# Patient Record
Sex: Female | Born: 1950
Health system: Southern US, Community
[De-identification: ages and names within clinical notes are randomized; demographics above are authoritative.]

## PROBLEM LIST (undated history)

## (undated) DIAGNOSIS — M810 Age-related osteoporosis without current pathological fracture: Secondary | ICD-10-CM

## (undated) DIAGNOSIS — F1910 Other psychoactive substance abuse, uncomplicated: Secondary | ICD-10-CM

## (undated) DIAGNOSIS — M2140 Flat foot [pes planus] (acquired), unspecified foot: Secondary | ICD-10-CM

## (undated) DIAGNOSIS — F419 Anxiety disorder, unspecified: Secondary | ICD-10-CM

## (undated) DIAGNOSIS — Z87898 Personal history of other specified conditions: Secondary | ICD-10-CM

## (undated) DIAGNOSIS — F329 Major depressive disorder, single episode, unspecified: Secondary | ICD-10-CM

## (undated) DIAGNOSIS — F32A Depression, unspecified: Secondary | ICD-10-CM

## (undated) DIAGNOSIS — M2012 Hallux valgus (acquired), left foot: Secondary | ICD-10-CM

## (undated) HISTORY — DX: Other psychoactive substance abuse, uncomplicated: F19.10

## (undated) HISTORY — DX: Hallux valgus (acquired), left foot: M20.12

## (undated) HISTORY — DX: Age-related osteoporosis without current pathological fracture: M81.0

## (undated) HISTORY — DX: Flat foot (pes planus) (acquired), unspecified foot: M21.40

## (undated) HISTORY — DX: Personal history of other specified conditions: Z87.898

## (undated) HISTORY — DX: Anxiety disorder, unspecified: F41.9

## (undated) HISTORY — DX: Depression, unspecified: F32.A

## (undated) HISTORY — PX: OTHER SURGICAL HISTORY: SHX169

## (undated) HISTORY — PX: AUGMENTATION MAMMAPLASTY: SUR837

## (undated) HISTORY — PX: COLONOSCOPY: SHX174

## (undated) HISTORY — DX: Major depressive disorder, single episode, unspecified: F32.9

## (undated) HISTORY — PX: PLACEMENT OF BREAST IMPLANTS: SHX6334

---

## 2013-08-11 LAB — HM MAMMOGRAPHY: HM MAMMO: NORMAL

## 2013-09-17 LAB — HM PAP SMEAR: HM PAP: NEGATIVE

## 2013-09-24 LAB — HM COLONOSCOPY: HM COLON: NEGATIVE

## 2014-09-13 DIAGNOSIS — F411 Generalized anxiety disorder: Secondary | ICD-10-CM | POA: Diagnosis not present

## 2014-09-13 DIAGNOSIS — F341 Dysthymic disorder: Secondary | ICD-10-CM | POA: Diagnosis not present

## 2014-09-30 DIAGNOSIS — R748 Abnormal levels of other serum enzymes: Secondary | ICD-10-CM | POA: Diagnosis not present

## 2014-09-30 DIAGNOSIS — Z79899 Other long term (current) drug therapy: Secondary | ICD-10-CM | POA: Diagnosis not present

## 2014-10-11 DIAGNOSIS — Z23 Encounter for immunization: Secondary | ICD-10-CM | POA: Diagnosis not present

## 2014-10-11 DIAGNOSIS — Z0000 Encounter for general adult medical examination without abnormal findings: Secondary | ICD-10-CM | POA: Diagnosis not present

## 2014-10-14 DIAGNOSIS — Z1231 Encounter for screening mammogram for malignant neoplasm of breast: Secondary | ICD-10-CM | POA: Diagnosis not present

## 2014-10-17 LAB — HM MAMMOGRAPHY

## 2014-11-30 DIAGNOSIS — Z1389 Encounter for screening for other disorder: Secondary | ICD-10-CM | POA: Diagnosis not present

## 2014-11-30 DIAGNOSIS — F329 Major depressive disorder, single episode, unspecified: Secondary | ICD-10-CM | POA: Diagnosis not present

## 2014-11-30 DIAGNOSIS — Z6826 Body mass index (BMI) 26.0-26.9, adult: Secondary | ICD-10-CM | POA: Diagnosis not present

## 2015-02-22 DIAGNOSIS — F411 Generalized anxiety disorder: Secondary | ICD-10-CM | POA: Diagnosis not present

## 2015-02-22 DIAGNOSIS — F321 Major depressive disorder, single episode, moderate: Secondary | ICD-10-CM | POA: Diagnosis not present

## 2015-03-13 DIAGNOSIS — Z23 Encounter for immunization: Secondary | ICD-10-CM | POA: Diagnosis not present

## 2015-03-30 DIAGNOSIS — F321 Major depressive disorder, single episode, moderate: Secondary | ICD-10-CM | POA: Diagnosis not present

## 2015-03-30 DIAGNOSIS — F411 Generalized anxiety disorder: Secondary | ICD-10-CM | POA: Diagnosis not present

## 2015-04-04 ENCOUNTER — Encounter: Payer: Self-pay | Admitting: Internal Medicine

## 2015-04-05 ENCOUNTER — Ambulatory Visit (INDEPENDENT_AMBULATORY_CARE_PROVIDER_SITE_OTHER): Payer: Medicare Other | Admitting: Family Medicine

## 2015-04-05 ENCOUNTER — Encounter: Payer: Self-pay | Admitting: Family Medicine

## 2015-04-05 VITALS — BP 122/70 | HR 72 | Ht 61.0 in | Wt 145.0 lb

## 2015-04-05 DIAGNOSIS — R42 Dizziness and giddiness: Secondary | ICD-10-CM | POA: Diagnosis not present

## 2015-04-05 DIAGNOSIS — F329 Major depressive disorder, single episode, unspecified: Secondary | ICD-10-CM

## 2015-04-05 DIAGNOSIS — F32A Depression, unspecified: Secondary | ICD-10-CM | POA: Insufficient documentation

## 2015-04-05 DIAGNOSIS — F411 Generalized anxiety disorder: Secondary | ICD-10-CM | POA: Diagnosis not present

## 2015-04-05 DIAGNOSIS — M79621 Pain in right upper arm: Secondary | ICD-10-CM

## 2015-04-05 DIAGNOSIS — Z7189 Other specified counseling: Secondary | ICD-10-CM | POA: Diagnosis not present

## 2015-04-05 DIAGNOSIS — Z7689 Persons encountering health services in other specified circumstances: Secondary | ICD-10-CM

## 2015-04-05 NOTE — Patient Instructions (Addendum)
For your arm pain try taking one Aleve 2 times daily for the next 2 weeks and using heat 20 minutes at a time. Keep a journal of your dizzy/lightheaded episodes and let me know if you are seeing a pattern or other symptoms. Your neurological exam today is normal. Stay well hydrated.    You can call to schedule your appointment with the psychiatrist. A few offices are listed below for you to call.   [facility]  [place], [place], [place] [address]  Phone: [phone]  [facility] [address] [place], [place] [address]  Phone: [place], [place], [place], [place], [place] [address] Phone: [phone]

## 2015-04-05 NOTE — Progress Notes (Signed)
Subjective:    Patient ID: Stacy Decker, female    DOB: [birth year], 64 y.o.   MRN: [id number]  HPI Chief Complaint  Patient presents with  . right arm pain and dizzy    right arm pain and dizzy- right arm pain for the last 2 months and then dizziness for the last 2 years.   She is new to the practice and here to establish primary care.   She has 2 acute complaints: 1)Complains of dizziness, lightheaded intermittently over past 6 months or longer. Happens a couple of times per month and is brief, lasting a few seconds. Cannot associate spells with anything such as positional changes but states she has not really been paying attention, they just come on out of the blue.  Denies syncope, headaches, chest pain, palpitations, DOE, blood in stool or urine, GI or GU complaints. States she does not do a good job drinking fluids.   2) Also complains of right upper anterior arm pain for past 3-4 months intermittent, non radiating and worse occasionally with playing tennis or when sleeping on it. Has taken Ibuprofen 400 mg-600 mg but not on regular basis.    History of fall with dog, and on ice. No broken bones. Sees a psychiatrist for depression/anxiety medications. States she is not thrilled with person she is seeing here.  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   Had medicare wellness exam this year. States normal exam. Colonoscopy- utd Mammogram-utd Pap smear-utd Periods stopped at least 10 years ago. Takes vitamin D and calcium.   Surgeries: carpel tunnel right, breast augmentation  Social: lives with husband, plays tennis, quit smoking in 1998, smoked 25 years and smoked 1.5 packs per year. Smoked a lot of marijuana in past History of alcohol addiction, met her husband in recovery.   Reviewed allergies, medications, past medical, surgical, social history.   Review of  Systems Pertinent positives and negatives in the history of present illness.    Objective:   Physical Exam  Constitutional: She is oriented to person, place, and time. She appears well-developed and well-nourished. No distress.  Cardiovascular: Normal rate, regular rhythm, intact distal pulses and normal pulses.   Pulmonary/Chest: Effort normal and breath sounds normal. She has no wheezes. She has no rhonchi.  Musculoskeletal:       Right shoulder: Normal.       Right elbow: Normal.      Arms: Neurological: She is alert and oriented to person, place, and time. She has normal strength and normal reflexes. No cranial nerve deficit or sensory deficit. She displays a negative Romberg sign. Gait normal.  No ataxia, normal finger to nose test, normal proprioception  Psychiatric: She has a normal mood and affect. Her speech is normal and behavior is normal. Thought content normal.  BP 122/70 mmHg  Pulse 72  Ht _0  (1.549 m)  Wt 145 lb (65.772 kg)  BMI 27.41 kg/m2  Orthostatic vitals normal, she is not postural    Assessment & Plan:  Pain of right upper arm  Encounter to establish care  Dizziness of unknown cause  Depression  Generalized anxiety disorder  Discussed that her neurological exam is normal and that she is not postural. Also, the fact that her fleeting episodes of lightheadedness are occuring so infrequently that it is not easy to pinpoint the exact etiology but it does not appear to be anything serious. Encouraged her to  start drinking more water and stay hydrated, avoid skipping meals and keep a journal of when the episodes are occurring in any associated symptoms or factors.  Suspect that her right upper arm pain is related to biceps tendinitis and recommend one Aleve twice daily for the next 2 weeks, take this with food and try heat 20 minutes at a time and resting her arm. Recommend she stop playing tennis for a couple weeks and see if her arm pain improves. Discussed  that the next step of treatment would be to discuss this with Dr. Redmond School and consider injection vs orthopedist referral.   She had physical exam with fasting labs this year. She will follow up as needed with me and return next year for physical.  Provided names of psychiatrists for her. She states she may want to switch to a different one. She is seen a psychiatrist for depression/anxiety medication management and states she is doing well on her current medication regimen.

## 2015-04-06 ENCOUNTER — Encounter: Payer: Self-pay | Admitting: Family Medicine

## 2015-04-13 ENCOUNTER — Encounter: Payer: Self-pay | Admitting: Family Medicine

## 2015-07-24 ENCOUNTER — Ambulatory Visit (INDEPENDENT_AMBULATORY_CARE_PROVIDER_SITE_OTHER): Payer: Medicare Other | Admitting: Family Medicine

## 2015-07-24 ENCOUNTER — Encounter: Payer: Self-pay | Admitting: Family Medicine

## 2015-07-24 ENCOUNTER — Other Ambulatory Visit: Payer: Self-pay | Admitting: Family Medicine

## 2015-07-24 VITALS — BP 124/80 | HR 68 | Temp 98.4°F | Wt 141.8 lb

## 2015-07-24 DIAGNOSIS — R110 Nausea: Secondary | ICD-10-CM | POA: Diagnosis not present

## 2015-07-24 DIAGNOSIS — R42 Dizziness and giddiness: Secondary | ICD-10-CM | POA: Diagnosis not present

## 2015-07-24 DIAGNOSIS — H8110 Benign paroxysmal vertigo, unspecified ear: Secondary | ICD-10-CM

## 2015-07-24 DIAGNOSIS — R7309 Other abnormal glucose: Secondary | ICD-10-CM | POA: Diagnosis not present

## 2015-07-24 LAB — CBC WITH DIFFERENTIAL/PLATELET
BASOS PCT: 1 % (ref 0–1)
Basophils Absolute: 0.1 10*3/uL (ref 0.0–0.1)
EOS PCT: 2 % (ref 0–5)
Eosinophils Absolute: 0.2 10*3/uL (ref 0.0–0.7)
HEMATOCRIT: 43.8 % (ref 36.0–46.0)
Hemoglobin: 14.5 g/dL (ref 12.0–15.0)
LYMPHS PCT: 13 % (ref 12–46)
Lymphs Abs: 1 10*3/uL (ref 0.7–4.0)
MCH: 29.4 pg (ref 26.0–34.0)
MCHC: 33.1 g/dL (ref 30.0–36.0)
MCV: 88.7 fL (ref 78.0–100.0)
MONO ABS: 0.6 10*3/uL (ref 0.1–1.0)
MONOS PCT: 7 % (ref 3–12)
MPV: 10.3 fL (ref 8.6–12.4)
Neutro Abs: 6.2 10*3/uL (ref 1.7–7.7)
Neutrophils Relative %: 77 % (ref 43–77)
PLATELETS: 260 10*3/uL (ref 150–400)
RBC: 4.94 MIL/uL (ref 3.87–5.11)
RDW: 14.4 % (ref 11.5–15.5)
WBC: 8 10*3/uL (ref 4.0–10.5)

## 2015-07-24 MED ORDER — MECLIZINE HCL 25 MG PO TABS: 25.0000 mg | ORAL_TABLET | Freq: Two times a day (BID) | ORAL | Status: DC | PRN

## 2015-07-24 MED ORDER — ONDANSETRON HCL 4 MG PO TABS: 4.0000 mg | ORAL_TABLET | Freq: Three times a day (TID) | ORAL | Status: DC | PRN

## 2015-07-24 NOTE — Patient Instructions (Signed)
Stay well hydrated. Take 12.5 mg of Meclizine twice daily and see if this helps with the dizziness. Change positions slowly. Take the Zofran as needed for nausea. Ginger ale is also good for this. Call if not improving in the next 2-3 days or if you get worse.  Benign Positional Vertigo Vertigo is the feeling that you or your surroundings are moving when they are not. Benign positional vertigo is the most common form of vertigo. The cause of this condition is not serious (is benign). This condition is triggered by certain movements and positions (is positional). This condition can be dangerous if it occurs while you are doing something that could endanger you or others, such as driving.  CAUSES In many cases, the cause of this condition is not known. It may be caused by a disturbance in an area of the inner ear that helps your brain to sense movement and balance. This disturbance can be caused by a viral infection (labyrinthitis), head injury, or repetitive motion. RISK FACTORS This condition is more likely to develop in:  Women.  People who are 46 years of age or older. SYMPTOMS Symptoms of this condition usually happen when you move your head or your eyes in different directions. Symptoms may start suddenly, and they usually last for less than a minute. Symptoms may include:  Loss of balance and falling.  Feeling like you are spinning or moving.  Feeling like your surroundings are spinning or moving.  Nausea and vomiting.  Blurred vision.  Dizziness.  Involuntary eye movement (nystagmus). Symptoms can be mild and cause only slight annoyance, or they can be severe and interfere with daily life. Episodes of benign positional vertigo may return (recur) over time, and they may be triggered by certain movements. Symptoms may improve over time. DIAGNOSIS This condition is usually diagnosed by medical history and a physical exam of the head, neck, and ears. You may be referred to a health  care provider who specializes in ear, nose, and throat (ENT) problems (otolaryngologist) or a provider who specializes in disorders of the nervous system (neurologist). You may have additional testing, including:  MRI.  A CT scan.  Eye movement tests. Your health care provider may ask you to change positions quickly while he or she watches you for symptoms of benign positional vertigo, such as nystagmus. Eye movement may be tested with an electronystagmogram (ENG), caloric stimulation, the Dix-Hallpike test, or the roll test.  An electroencephalogram (EEG). This records electrical activity in your brain.  Hearing tests. TREATMENT Usually, your health care provider will treat this by moving your head in specific positions to adjust your inner ear back to normal. Surgery may be needed in severe cases, but this is rare. In some cases, benign positional vertigo may resolve on its own in 2-4 weeks. HOME CARE INSTRUCTIONS Safety  Move slowly.Avoid sudden body or head movements.  Avoid driving.  Avoid operating heavy machinery.  Avoid doing any tasks that would be dangerous to you or others if a vertigo episode would occur.  If you have trouble walking or keeping your balance, try using a cane for stability. If you feel dizzy or unstable, sit down right away.  Return to your normal activities as told by your health care provider. Ask your health care provider what activities are safe for you. General Instructions  Take over-the-counter and prescription medicines only as told by your health care provider.  Avoid certain positions or movements as told by your health care provider.  Drink enough fluid to keep your urine clear or pale yellow.  Keep all follow-up visits as told by your health care provider. This is important. SEEK MEDICAL CARE IF:  You have a fever.  Your condition gets worse or you develop new symptoms.  Your family or friends notice any behavioral changes.  Your  nausea or vomiting gets worse.  You have numbness or a "pins and needles" sensation. SEEK IMMEDIATE MEDICAL CARE IF:  You have difficulty speaking or moving.  You are always dizzy.  You faint.  You develop severe headaches.  You have weakness in your legs or arms.  You have changes in your hearing or vision.  You develop a stiff neck.  You develop sensitivity to light.   This information is not intended to replace advice given to you by your health care provider. Make sure you discuss any questions you have with your health care provider.   Document Released: 02/18/2006 Document Revised: 02/01/2015 Document Reviewed: 09/05/2014 Elsevier Interactive Patient Education [employer].

## 2015-07-24 NOTE — Progress Notes (Signed)
   Subjective:    Patient ID: Stacy Decker, female    DOB: [birth year], 65 y.o.   MRN: [id number]  HPI Chief Complaint  Patient presents with  . veritago    vertigo started yesterday. nausea. gets vertigo every 6 months.    She is here with complaints of dizziness since waking up this morning that she describes as a spinning sensation. States she noticed it when turning over in bed and with changing positions. States the spinning sensation goes away within a few seconds after she changes position. Also reports intermittent nausea, no vomiting. She reports history similar dizzy episodes but has never been diagnosed with vertigo.  Denies tinnitus, headache, syncope, visual changes, chest pain, palpitations, DOE, LE edema, cough, vomiting, diarrhea, abdominal pain, numbness, tingling, weakness to her extremities.    Review of Systems Pertinent positives and negatives in the history of present illness.     Objective:   Physical Exam  Constitutional: She is oriented to person, place, and time. She appears well-developed and well-nourished. No distress.  HENT:  Right Ear: Hearing, tympanic membrane and ear canal normal.  Left Ear: Hearing, tympanic membrane and ear canal normal.  Nose: Nose normal.  Mouth/Throat: Uvula is midline, oropharynx is clear and moist and mucous membranes are normal.  Eyes: Conjunctivae, EOM and lids are normal. Pupils are equal, round, and reactive to light.  Neck: Normal range of motion and full passive range of motion without pain. Neck supple.  Cardiovascular: Normal rate, regular rhythm, normal heart sounds and normal pulses.   Pulmonary/Chest: Effort normal and breath sounds normal.  Lymphadenopathy:    She has no cervical adenopathy.  Neurological: She is alert and oriented to person, place, and time. She has normal strength and normal reflexes. She displays no tremor. No cranial nerve deficit or sensory deficit. She displays a negative Romberg sign. Coordination  and gait normal.  Finger to nose normal, no ataxia.   Heel to shin normal Equal grip Normal rapid movement test  Skin: Skin is warm and dry. No rash noted. No cyanosis. No pallor.  Psychiatric: She has a normal mood and affect. Her speech is normal and behavior is normal. Judgment and thought content normal. Cognition and memory are normal.   BP 130/80 mmHg  Pulse 64  Temp(Src) 98.4 F (36.9 C) (Oral)  Wt 141 lb 12.8 oz (64.32 kg)   Orthostatic vitals: not postural    Assessment & Plan:  Dizzinesses - Plan: CBC with Differential/Platelet, Comprehensive metabolic panel, meclizine (ANTIVERT) 25 MG tablet  BPPV (benign paroxysmal positional vertigo), unspecified laterality  Nausea without vomiting - Plan: ondansetron (ZOFRAN) 4 MG tablet  Discussed with patient that her neurological exam is normal and she is not postural. Discussed differentials for dizziness for dizziness and suspect that her symptoms are related to vertigo, positional. Recommend that she stay well hydrated, take meclizine 12.5 mg twice daily and see if this helps with symptoms. Prescription for Zofran also sent to pharmacy to use as needed for nausea. She will let me know if medication is not helping with her symptoms. Discussed red flags of stroke-like symptoms. Discussed that we will consider vestibular rehab in future if not improving.

## 2015-07-25 LAB — HEMOGLOBIN A1C
HEMOGLOBIN A1C: 5.7 % — AB (ref <5.7)
MEAN PLASMA GLUCOSE: 117 mg/dL — AB (ref <117)

## 2015-07-25 LAB — COMPREHENSIVE METABOLIC PANEL
ALK PHOS: 86 U/L (ref 33–130)
ALT: 10 U/L (ref 6–29)
AST: 17 U/L (ref 10–35)
Albumin: 3.9 g/dL (ref 3.6–5.1)
BUN: 14 mg/dL (ref 7–25)
CALCIUM: 9.2 mg/dL (ref 8.6–10.4)
CHLORIDE: 103 mmol/L (ref 98–110)
CO2: 27 mmol/L (ref 20–31)
Creat: 0.63 mg/dL (ref 0.50–0.99)
GLUCOSE: 119 mg/dL — AB (ref 65–99)
POTASSIUM: 4.7 mmol/L (ref 3.5–5.3)
Sodium: 138 mmol/L (ref 135–146)
Total Bilirubin: 0.4 mg/dL (ref 0.2–1.2)
Total Protein: 6 g/dL — ABNORMAL LOW (ref 6.1–8.1)

## 2015-08-23 DIAGNOSIS — F321 Major depressive disorder, single episode, moderate: Secondary | ICD-10-CM | POA: Diagnosis not present

## 2015-08-23 DIAGNOSIS — F411 Generalized anxiety disorder: Secondary | ICD-10-CM | POA: Diagnosis not present

## 2015-09-18 ENCOUNTER — Encounter: Payer: Medicare Other | Admitting: Podiatry

## 2015-10-25 NOTE — Progress Notes (Signed)
This encounter was created in error - please disregard.

## 2015-12-28 DIAGNOSIS — F411 Generalized anxiety disorder: Secondary | ICD-10-CM | POA: Diagnosis not present

## 2015-12-28 DIAGNOSIS — F321 Major depressive disorder, single episode, moderate: Secondary | ICD-10-CM | POA: Diagnosis not present

## 2016-01-30 ENCOUNTER — Encounter: Payer: Self-pay | Admitting: Family Medicine

## 2016-01-30 ENCOUNTER — Ambulatory Visit (INDEPENDENT_AMBULATORY_CARE_PROVIDER_SITE_OTHER): Payer: Medicare Other | Admitting: Family Medicine

## 2016-01-30 VITALS — BP 120/78 | HR 80 | Wt 142.2 lb

## 2016-01-30 DIAGNOSIS — Z23 Encounter for immunization: Secondary | ICD-10-CM | POA: Diagnosis not present

## 2016-01-30 DIAGNOSIS — Z7289 Other problems related to lifestyle: Secondary | ICD-10-CM

## 2016-01-30 DIAGNOSIS — R7309 Other abnormal glucose: Secondary | ICD-10-CM | POA: Insufficient documentation

## 2016-01-30 DIAGNOSIS — Z13220 Encounter for screening for lipoid disorders: Secondary | ICD-10-CM | POA: Diagnosis not present

## 2016-01-30 DIAGNOSIS — F32A Depression, unspecified: Secondary | ICD-10-CM

## 2016-01-30 DIAGNOSIS — F411 Generalized anxiety disorder: Secondary | ICD-10-CM

## 2016-01-30 DIAGNOSIS — Z609 Problem related to social environment, unspecified: Secondary | ICD-10-CM

## 2016-01-30 DIAGNOSIS — F329 Major depressive disorder, single episode, unspecified: Secondary | ICD-10-CM | POA: Diagnosis not present

## 2016-01-30 DIAGNOSIS — M79672 Pain in left foot: Secondary | ICD-10-CM | POA: Diagnosis not present

## 2016-01-30 DIAGNOSIS — Z136 Encounter for screening for cardiovascular disorders: Secondary | ICD-10-CM

## 2016-01-30 DIAGNOSIS — R5383 Other fatigue: Secondary | ICD-10-CM

## 2016-01-30 DIAGNOSIS — IMO0001 Reserved for inherently not codable concepts without codable children: Secondary | ICD-10-CM

## 2016-01-30 LAB — CBC WITH DIFFERENTIAL/PLATELET
BASOS ABS: 0 {cells}/uL (ref 0–200)
Basophils Relative: 0 %
EOS PCT: 5 %
Eosinophils Absolute: 340 cells/uL (ref 15–500)
HCT: 42.4 % (ref 35.0–45.0)
Hemoglobin: 14.2 g/dL (ref 11.7–15.5)
LYMPHS ABS: 1360 {cells}/uL (ref 850–3900)
LYMPHS PCT: 20 %
MCH: 28.7 pg (ref 27.0–33.0)
MCHC: 33.5 g/dL (ref 32.0–36.0)
MCV: 85.8 fL (ref 80.0–100.0)
MONOS PCT: 11 %
MPV: 11 fL (ref 7.5–12.5)
Monocytes Absolute: 748 cells/uL (ref 200–950)
NEUTROS PCT: 64 %
Neutro Abs: 4352 cells/uL (ref 1500–7800)
PLATELETS: 294 10*3/uL (ref 140–400)
RBC: 4.94 MIL/uL (ref 3.80–5.10)
RDW: 13.8 % (ref 11.0–15.0)
WBC: 6.8 10*3/uL (ref 4.0–10.5)

## 2016-01-30 LAB — POCT GLYCOSYLATED HEMOGLOBIN (HGB A1C): Hemoglobin A1C: 5.3

## 2016-01-30 LAB — TSH: TSH: 1.84 mIU/L

## 2016-01-30 NOTE — Progress Notes (Signed)
Subjective:    Patient ID: Stacy Decker, female    DOB: 1950-07-17, 65 y.o.   MRN: [id number]  HPI Chief Complaint  Patient presents with  . Other    6 month follow-up DM   She is here for a follow up on elevated A1C. A1C was 5.7% in 06/2015.  She is fasting today and would like to have labs drawn and return for a CPE.   She was having issues with dizziness in February when I last saw her which seemed to be vertigo. She denies having any problems with this.   She has a psychiatrist who is prescribing psych medications for her. She would like to find a new one.  She reduced her dose of citalopram to half Has taken hydroxyzine 10 mg for anxiety a couple of times.  She has history of addiction issues.   Has noticed feeling more tired and sleeping more, has been sleeping later in the mornings.  Denies fever, chills, unexplained weight change, headaches, dizziness, chest pain, palpitations, DOE, orthopnea, LE edema.  History of left foot issues that have not improved. Has been under care of a podiatrist in [place] and would like to be referred to a podiatrist here. Denies redness, warmth, edema, numbness, tingling or weakness. Reports pain when jogging. Has orthotics and has been wearing these but no longer seems to be helping when jogging.   Reviewed allergies, medications, past medical, and social history.   Review of Systems Pertinent positives and negatives in the history of present illness.     Objective:   Physical Exam  Constitutional: She is oriented to person, place, and time. She appears well-developed and well-nourished. No distress.  Neck: Neck supple. No JVD present. No thyromegaly present.  Cardiovascular: Normal rate, regular rhythm, normal heart sounds and intact distal pulses.  Exam reveals no gallop and no friction rub.   No murmur heard. Pulmonary/Chest: Effort normal and breath sounds normal.  Musculoskeletal:       Left foot: There is deformity. There is normal  range of motion, no tenderness, no bony tenderness, no swelling and normal capillary refill.  Lymphadenopathy:    She has no cervical adenopathy.  Neurological: She is alert and oriented to person, place, and time.  Skin: Skin is warm and dry. No pallor.  Psychiatric: She has a normal mood and affect. Her behavior is normal. Judgment and thought content normal.   BP 120/78   Pulse 80   Wt 142 lb 3.2 oz (64.5 kg)   BMI 26.87 kg/m   A1C today is 5.3%      Assessment & Plan:  Elevated hemoglobin A1c - Plan: POCT glycosylated hemoglobin (Hb A1C), Comprehensive metabolic panel  Generalized anxiety disorder  Depression  Need for influenza vaccination - Plan: Flu vaccine HIGH DOSE PF  Left foot pain - Plan: Ambulatory referral to Podiatry  Other fatigue - Plan: CBC with Differential, Comprehensive metabolic panel, TSH  High risk social situation - Plan: Hepatitis C Antibody  Encounter for cholesteral screening for cardiovascular disease - Plan: Lipid Panel  Discussed that her A1c is now in normal range and no longer prediabetes. Continue with healthy lifestyle modifications.  Plan to look for underlying etiology for fatigue. Labs ordered.  Referral to podiatrist for chronic foot pain.  List of psychiatrists given to patient per request. She is aware that she must call to see who will take Medicare if she wants to switch psychiatrists. She will continue current medications for anxiety and depression.  Due to history of addiction hydroxyzine is appropriate treatment for anxiety currently.  Labs ordered and she will return for CPE and AWV in the next 2 weeks.  Hepatitis C ordered per age group.  Flu shot given.

## 2016-01-30 NOTE — Patient Instructions (Signed)
You can call to schedule your appointment with the psychiatrist. A few offices are listed below for you to call.   [facility]  [place], [place], [place] [address]  Phone: [phone]  [facility] [address] [place], [place] [address]  Phone: [place], [place], [place], [place], [place] [address] Phone: [place]  [place], [place]

## 2016-01-31 LAB — LIPID PANEL
CHOL/HDL RATIO: 3.3 ratio (ref <=5.0)
CHOLESTEROL: 152 mg/dL (ref 125–200)
HDL: 46 mg/dL (ref >=46)
LDL CALC: 84 mg/dL (ref <130)
Triglycerides: 110 mg/dL (ref <150)
VLDL: 22 mg/dL (ref <30)

## 2016-01-31 LAB — COMPREHENSIVE METABOLIC PANEL
ALBUMIN: 4 g/dL (ref 3.6–5.1)
ALT: 13 U/L (ref 6–29)
AST: 19 U/L (ref 10–35)
Alkaline Phosphatase: 110 U/L (ref 33–130)
BILIRUBIN TOTAL: 0.6 mg/dL (ref 0.2–1.2)
BUN: 14 mg/dL (ref 7–25)
CHLORIDE: 104 mmol/L (ref 98–110)
CO2: 23 mmol/L (ref 20–31)
CREATININE: 0.69 mg/dL (ref 0.50–0.99)
Calcium: 9.6 mg/dL (ref 8.6–10.4)
GLUCOSE: 94 mg/dL (ref 65–99)
Potassium: 4.4 mmol/L (ref 3.5–5.3)
SODIUM: 140 mmol/L (ref 135–146)
Total Protein: 6.3 g/dL (ref 6.1–8.1)

## 2016-01-31 LAB — HEPATITIS C ANTIBODY: HCV Ab: NEGATIVE

## 2016-02-02 DIAGNOSIS — H25813 Combined forms of age-related cataract, bilateral: Secondary | ICD-10-CM | POA: Diagnosis not present

## 2016-02-02 DIAGNOSIS — H5203 Hypermetropia, bilateral: Secondary | ICD-10-CM | POA: Diagnosis not present

## 2016-02-02 DIAGNOSIS — H524 Presbyopia: Secondary | ICD-10-CM | POA: Diagnosis not present

## 2016-02-02 DIAGNOSIS — H52223 Regular astigmatism, bilateral: Secondary | ICD-10-CM | POA: Diagnosis not present

## 2016-02-07 ENCOUNTER — Encounter: Payer: Self-pay | Admitting: Internal Medicine

## 2016-02-14 ENCOUNTER — Ambulatory Visit: Payer: Medicare Other | Admitting: Podiatry

## 2016-02-19 ENCOUNTER — Encounter: Payer: Self-pay | Admitting: Family Medicine

## 2016-02-19 ENCOUNTER — Ambulatory Visit (INDEPENDENT_AMBULATORY_CARE_PROVIDER_SITE_OTHER): Payer: Medicare Other | Admitting: Family Medicine

## 2016-02-19 VITALS — BP 120/70 | HR 75 | Ht 60.75 in | Wt 143.4 lb

## 2016-02-19 DIAGNOSIS — Z0000 Encounter for general adult medical examination without abnormal findings: Secondary | ICD-10-CM

## 2016-02-19 DIAGNOSIS — Z23 Encounter for immunization: Secondary | ICD-10-CM | POA: Diagnosis not present

## 2016-02-19 DIAGNOSIS — Z1211 Encounter for screening for malignant neoplasm of colon: Secondary | ICD-10-CM

## 2016-02-19 DIAGNOSIS — Z8739 Personal history of other diseases of the musculoskeletal system and connective tissue: Secondary | ICD-10-CM

## 2016-02-19 DIAGNOSIS — Z803 Family history of malignant neoplasm of breast: Secondary | ICD-10-CM

## 2016-02-19 DIAGNOSIS — Z1239 Encounter for other screening for malignant neoplasm of breast: Secondary | ICD-10-CM

## 2016-02-19 DIAGNOSIS — E2839 Other primary ovarian failure: Secondary | ICD-10-CM

## 2016-02-19 NOTE — Progress Notes (Signed)
Stacy Decker is a 65 y.o. female who presents for annual wellness visit and follow-up on chronic medical conditions.  She has the following concerns: none   Immunization History  Administered Date(s) Administered  . Influenza, High Dose Seasonal PF 01/30/2016  . Influenza-Unspecified 03/10/2012, 02/12/2013, 03/11/2015  . Pneumococcal Conjugate-13 10/11/2014  . Pneumococcal Polysaccharide-23 02/19/2016  . Tdap 09/10/2013  . Zoster 03/10/2012   Last Pap smear: unknown, ?10 years  Last mammogram: 2016 Last colonoscopy: 15 years ago Last DEXA: never Dentist: does not have one.  Ophtho: [place] Exercise: yes- play tennis 1-2 times a week  Other doctors caring for patient include: Psychiatrist- med management ([facility])   Depression screen:  See questionnaire below.  Depression screen PHQ 2/9 02/19/2016  Decreased Interest 1  Down, Depressed, Hopeless 0  PHQ - 2 Score 1    Fall Risk Screen: see questionnaire below. Fall Risk  02/19/2016  Falls in the past year? No    ADL screen:  See questionnaire below Functional Status Survey: Is the patient deaf or have difficulty hearing?: No Does the patient have difficulty seeing, even when wearing glasses/contacts?: No Does the patient have difficulty concentrating, remembering, or making decisions?: No Does the patient have difficulty walking or climbing stairs?: Yes (tired climbing stairs) Does the patient have difficulty dressing or bathing?: No Does the patient have difficulty doing errands alone such as visiting a doctor's office or shopping?: No   End of Life Discussion:  Patient has a living will and medical power of attorney  Review of Systems Constitutional: -fever, -chills, -sweats, -unexpected weight change, -anorexia, -fatigue Allergy: -sneezing, -itching, -congestion Dermatology: denies changing moles, rash, lumps, new worrisome lesions ENT: -runny nose, -ear pain, -sore throat,  -hoarseness, -sinus pain, -teeth pain, -tinnitus, -hearing loss Cardiology:  -chest pain, -palpitations, -edema, -orthopnea, -paroxysmal nocturnal dyspnea Respiratory: -cough, -shortness of breath, -dyspnea on exertion, -wheezing, -hemoptysis Gastroenterology: -abdominal pain, -nausea, -vomiting, -diarrhea, -constipation, -blood in stool, -changes in bowel movement, -dysphagia Hematology: -bleeding or bruising problems Musculoskeletal: -arthralgias, -myalgias, -joint swelling, -back pain, -neck pain, -cramping, -gait changes Ophthalmology: -vision changes, -eye redness, -itching, -discharge Urology: -dysuria, -difficulty urinating, -hematuria, -urinary frequency, -urgency, incontinence Neurology: -headache, -weakness, -tingling, -numbness, -speech abnormality, -memory loss, -falls, -dizziness Psychology:  -depressed mood, -agitation, -sleep problems    PHYSICAL EXAM:  BP 120/70   Pulse 75   Ht 5' 0.75" (1.543 m)   Wt 143 lb 6.4 oz (65 kg)   BMI 27.32 kg/m   General Appearance: Alert, cooperative, no distress, appears stated age Head: Normocephalic, without obvious abnormality, atraumatic Eyes: PERRL, conjunctiva/corneas clear, EOM's intact, fundi benign Ears: Normal TM's and external ear canals Nose: Nares normal, mucosa normal, no drainage or sinus   tenderness Throat: Lips, mucosa, and tongue normal; teeth and gums normal Neck: Supple, no lymphadenopathy; thyroid: no enlargement/tenderness/nodules; no carotid bruit or JVD Back: Spine nontender, no curvature, ROM normal, no CVA tenderness Lungs: Clear to auscultation bilaterally without wheezes, rales or ronchi; respirations unlabored Chest Wall: No tenderness or deformity Heart: Regular rate and rhythm, S1 and S2 normal, no murmur, rub or gallop Breast Exam: No tenderness, masses, or nipple discharge or inversion. No axillary lymphadenopathy Abdomen: Soft, non-tender, nondistended, normoactive bowel sounds, no masses, no  hepatosplenomegaly Genitalia: declined. Pap is up to date, April 2015. Negative HPV and normal.  Rectal: Normal tone, no masses or tenderness; guaiac negative stool Extremities: No clubbing, cyanosis or edema Pulses: 2+ and symmetric all extremities Skin: Skin color, texture,  turgor normal, no rashes or lesions Lymph nodes: Cervical, supraclavicular, and axillary nodes normal Neurologic: CNII-XII intact, normal strength, sensation and gait; reflexes 2+ and symmetric throughout Psych: Normal mood, affect, hygiene and grooming.  ASSESSMENT/PLAN:  Medicare annual wellness visit, initial  Estrogen deficiency - Plan: DG Bone Density  History of osteoporosis - Plan: DG Bone Density  Special screening for malignant neoplasms, colon - Plan: Ambulatory referral to Gastroenterology  Screening for breast cancer  Family history of breast cancer in first degree relative - Plan: MM DIGITAL SCREENING BILATERAL  Breast cancer screening, high risk patient - Plan: MM DIGITAL SCREENING BILATERAL  Need for prophylactic vaccination against Streptococcus pneumoniae (pneumococcus) - Plan: Pneumococcal polysaccharide vaccine 23-valent greater than or equal to 2yo subcutaneous/IM   Discussed monthly self breast exams and yearly mammograms; at least 30 minutes of aerobic activity at least 5 days/week and weight-bearing exercise 2x/week; proper sunscreen use reviewed; healthy diet, including goals of calcium and vitamin D intake and alcohol recommendations (less than or equal to 1 drink/day) reviewed; regular seatbelt use; changing batteries in smoke detectors.  Immunization recommendations discussed and updated. Colonoscopy recommendations reviewed and referral made.  Orders placed for mammogram and DEXA.    Medicare Attestation I have personally reviewed: The patient's medical and social history Their use of alcohol, tobacco or illicit drugs Their current medications and supplements The patient's  functional ability including ADLs,fall risks, home safety risks, cognitive, and hearing and visual impairment Diet and physical activities Evidence for depression or mood disorders  The patient's weight, height, and BMI have been recorded in the chart.  I have made referrals, counseling, and provided education to the patient based on review of the above and I have provided the patient with a written personalized care plan for preventive services.     Harland Dingwall, NP   02/19/2016

## 2016-02-19 NOTE — Patient Instructions (Signed)
MEDICARE PREVENTATIVE SERVICES (FEMALE) AND PERSONALIZED PLAN for Stacy Decker February 19, 2016  CONDITIONS OR RISKS IDENTIFIED TODAY None  Recommend you get your bone density, mammogram and colonoscopy screening tests as discussed.    SPECIFIC RECOMMENDATIONS: Bring in your advance directives so we can scan these into your chart.  Take vitamin D supplement and get weight bearing exercises. We will call you with mammogram, bone density results.   Influenza vaccine: up to date Pneumococcal vaccine: prevnar up to date. Pneumovax 23 given today.  Shingles vaccine: up to date Tdap vaccine: up to date Colonoscopy: referral made Mammogram: order in computer Pelvic exam: up to date Pap smear: 2015  Aspirin 81mg  nightly for heart health Make sure you are getting adequate weight bearing exercise. 150 minutes per week.  Eat a well balanced healthy diet.  Return in 1 year or sooner if needed.    GENERAL RECOMMENDATIONS FOR GOOD HEALTH:  Supplements:  . Take a daily baby Aspirin 81mg  at bedtime for heart health unless you have a history of gastrointestinal bleed, allergy to aspirin, or are already taking higher dose Aspirin or other antiplatelet or blood thinner medication.   . Consume 1200 mg of Calcium daily through dietary calcium or supplement if you are female age 22 or older, or men 77 and older.   Men aged 21-70 should consume 1000 mg of Calcium daily. . Take 600 IU of Vitamin D daily.  Take 800 IU of Calcium daily if you are older than age 75.  . Take a general multivitamin daily.   Healthy diet: Eat a variety of foods, including fruits, vegetables, vegetable protein such as beans, lentils, tofu, and grains, such as rice.  Limit meat or animal protein, but if you eat meat, choose leans cuts such as chicken, fish, or [place].  Drink plenty of water daily.  Decrease saturated fat in the diet, avoid lots of red meat, processed foods, sweets, fast foods, and fried foods.  Limit salt and  caffeine intake.  Exercise: Aerobic exercise helps maintain good heart health. Weight bearing exercise helps keep bones and muscles working strong.  We recommend at least 30-40 minutes of exercise most days of the week.   Fall prevention: Falls are the leading cause of injuries, accidents, and accidental deaths in people over the age of 56. Falling is a real threat to your ability to live on your own.  Causes include poor eyesight or poor hearing, illness, poor lighting, throw rugs, clutter in your home, and medication side effects causing dizziness or balance problems.  Such medications can include medications for depression, sleep problems, high blood pressure, diabetes, and heart conditions.   PREVENTION  Be sure your home is as safe as possible. Here are some tips:  Wear shoes with non-skid soles (not house slippers).   Be sure your home and outside area are well lit.   Use night lights throughout your house, including hallways and stairways.   Remove clutter and clean up spills on floors and walkways.   Remove throw rugs or fasten them to the floor with carpet tape. Tack down carpet edges.   Do not place electrical cords across pathways.   Install grab bars in your bathtub, shower, and toilet area. Towel bars should not be used as a grab bar.   Install handrails on both sides of stairways.   Do not climb on stools or stepladders. Get someone else to help with jobs that require climbing.   Do not wax your floors  at all, or use a non-skid wax.   Repair uneven or unsafe sidewalks, walkways or stairs.   Keep frequently used items within reach.   Be aware of pets so you do not trip.  Get regular check-ups from your doctor, and take good care of yourself:  Have your eyes checked every year for vision changes, cataracts, glaucoma, and other eye problems. Wear eyeglasses as directed.   Have your hearing checked every 2 years, or anytime you or others think that you cannot hear  well. Use hearing aids as directed.   See your caregiver if you have foot pain or corns. Sore feet can contribute to falls.   Let your caregiver know if a medicine is making you feel dizzy or making you lose your balance.   Use a cane, walker, or wheelchair as directed. Use walker or wheelchair brakes when getting in and out.   When you get up from bed, sit on the side of the bed for 1 to 2 minutes before you stand up. This will give your blood pressure time to adjust, and you will feel less dizzy.   If you need to go to the bathroom often, consider using a bedside commode.  Disease prevention:  If you smoke or chew tobacco, find out from your caregiver how to quit. It can literally save your life, no matter how long you have been a tobacco user. If you do not use tobacco, never begin. Medicare does cover some smoking cessation counseling.  Maintain a healthy diet and normal weight. Increased weight leads to problems with blood pressure and diabetes. We check your height, weight, and BMI as part of your yearly visit.  The Body Mass Index or BMI is a way of measuring how much of your body is fat. Having a BMI above 27 increases the risk of heart disease, diabetes, hypertension, stroke and other problems related to obesity. Your caregiver can help determine your BMI and based on it develop an exercise and dietary program to help you achieve or maintain this important measurement at a healthful level.  High blood pressure causes heart and blood vessel problems.  Persistent high blood pressure should be treated with medicine if weight loss and exercise do not work.  We check your blood pressure as part of your yearly visit.  Avoid drinking alcohol in excess (more than two drinks per day).  Avoid use of street drugs. Do not share needles with anyone. Ask for professional help if you need assistance or instructions on stopping the use of alcohol, cigarettes, and/or drugs.  Brush your teeth twice a  day with fluoride toothpaste, and floss once a day. Good oral hygiene prevents tooth decay and gum disease. The problems can be painful, unattractive, and can cause other health problems. Visit your dentist for a routine oral and dental checkup and preventive care every 6-12 months.   See your eye doctor yearly for routine screening for things like glaucoma.  Look at your skin regularly.  Use a mirror to look at your back. Notify your caregivers of changes in moles, especially if there are changes in shapes, colors, a size larger than a pencil eraser, an irregular border, or development of new moles.  Safety:  Use seatbelts 100% of the time, whether driving or as a passenger.  Use safety devices such as hearing protection if you work in environments with loud noise or significant background noise.  Use safety glasses when doing any work that could send debris in  to the eyes.  Use a helmet if you ride a bike or motorcycle.  Use appropriate safety gear for contact sports.  Talk to your caregiver about gun safety.  Use sunscreen with a SPF (or skin protection factor) of 15 or greater.  Lighter skinned people are at a greater risk of skin cancer. Don't forget to also wear sunglasses in order to protect your eyes from too much damaging sunlight. Damaging sunlight can accelerate cataract formation.   If you have multiple sexual partners, or if you are not in a monogamous relationship, practice safe sex. Use condoms. Condoms are used to help reduce the spread of sexually transmitted infections (or STIs).  Consider an HIV test if you have never been tested.  Consider routine screening for STIs if you have multiple sexual partners.   Keep carbon monoxide and smoke detectors in your home functioning at all times. Change the batteries every 6 months or use a model that plugs into the wall or is hard wired in.   END OF LIFE PLANNING/ADVANCED DIRECTIVES Advance health-care planning is deciding the kind of care  you want at the end of life. While alert competent adults are able to exercise their rights to make health care and financial decisions, problems arise when an individual becomes unconscious, incapacitated, or otherwise unable to communicate or make such decisions. Advance health care directives are the legal documents in which you give written instructions about your choices limited, aggressive or palliative care if, in the future, you cannot speak for yourself.  Advanced directives include the following: [place] allows you to appoint someone to act as your health care agent to make health care decisions for you should it be determined by your health care provider that you are no longer able to make these decisions for yourself.  A Living Will is a legal document in which you can declare that under certain conditions you desire your life not be prolonged by extraordinary or artificial means during your last illness or when you are near death. We can provide you with sample advanced directives, you can get an attorney to prepare these for you, or you can visit [place] Secretary of State's website for additional information and resources at [email or web address]  Further, I recommend you have an attorney prepare a Will and Durable Power of Attorney if you haven't done so already.  Please get [place] a copy of your health care Advanced Directives.   PREVENTATIV E CARE RECOMMENDATIONS:  Vaccinations: We recommend the following vaccinations as part of your preventative care:  Pneumococcal vaccine is recommended to protect against certain types of pneumonia.  This is normally recommended for adults age 84 or older once, or up to every 5 years for those at high risk.  The vaccine is also recommended for adults younger than 66 years old with certain underlying conditions that make them high risk for pneumonia.  Influenza vaccine is recommended to protect against seasonal  influenza or "the flu." Influenza is a serious disease that can lead to hospitalization and sometimes even death. Traditional flu vaccines (called trivalent vaccines) are made to protect against three flu viruses; an influenza A (H1N1) virus, an influenza A (H3N2) virus, and an influenza B virus. In addition, there are flu vaccines made to protect against four flu viruses (called "quadrivalent" vaccines). These vaccines protect against the same viruses as the trivalent vaccine and an additional B virus.  We recommend the high dose influenza vaccine to those 65 years  and older.  Hepatitis B vaccine to protect against a form of infection of the liver by a virus acquired from blood or body fluids, particularly for high risk groups.  Td or Tdap vaccine to protect against Tetanus, diphtheria and pertussis which can be very serious.  These diseases are caused by bacteria.  Diphtheria and pertussis are spread from person to person through coughing or sneezing.  Tetanus enters the body through cuts, scratches, or wounds.  Tetanus (Lockjaw) causes painful muscle tightening and stiffness, usually all over the body.  Diphtheria can cause a thick coating to form in the back of the throat.  It can lead to breathing problems, paralysis, heart failure, and death.  Pertussis (Whooping Cough) causes severe coughing spells, which can cause difficulty breathing, vomiting and disturbed sleep.  Td or Tdap is usually given every 10 years.  Shingles vaccine to protect against Varicella Zoster if you are older than age 46, or younger than 67 years old with certain underlying illness.    Cancer Screening: Most routine colon cancer screening begins at the age of 51.  Subsequent colonoscopies are performed either every 5-10 years for normal screening, or every 2-5 years for higher risks patients, up until age 60 years of age. Annual screening is done with easy to use take-home tests to check for hidden blood in the stool called  hemoccult tests.  Sigmoidoscopy or colonoscopy can detect the earliest forms of colon cancer and is life saving. These tests use a small camera at the end of a tube to directly examine the colon.   Pelvic Exam and Pap Smear: Pelvic exams and pap smears are performed routinely to evaluate for abnormalities as well as cancers including cervical and vaginal cancers.  This is generally performed every 2-3 years for most women, or more frequently for higher risk patients.  Mammograms: Mammograms are used to screen for breast cancer.  Medicare covers baseline screening once from ages 6-45 years old, but will cover mammograms yearly for those 40 years and older.  In accordance with other guidelines, you may not need a mammogram every year though.  The decision on how frequently you need a mammogram should be discussed with you medical provider.    Osteoporosis Screening: Screening for osteoporosis usually begins at age 36 for women, and can be done as frequent as every 2 years.  However, women or men with higher risk of osteoporosis may be screened earlier than age 64.  Osteoporosis or low bone mass is diminished bone strength from alterations in bone architecture leading to bone fragility and increased fracture risk.     Cardiovascular Screening: Fat and cholesterol leaves deposits in your arteries that can block them. This causes heart disease and vessel disease elsewhere in your body.  If your cholesterol is found to be high, or if you have heart disease or certain other medical conditions, then you may need to have your cholesterol monitored frequently and be treated with medication. Cardiovascular screening in the form of lab tests for cholesterol, HDL and triglycerides can be done every 5 years.  A screening electrocardiogram can be done as part of the Welcome to Medicare physical.  Diabetes Screening: Diabetes screening can be done at least every 3 years for those with risk factors,  or every  6-68months for prediabetic patients.  Screening includes fasting blood sugar test or glucose tolerance test.  Risk factors include hypertension, dyslipidemia, obesity, previously abnormal glucose tests, family history of diabetes, age 92 years or older, and  history of gestations diabetes.   AAA (abdominal aortic aneurysm) Screening: Medicare allows for a one time ultrasound to screen for abdominal aortic aneurysm if done as a referral as part of the Welcome to Medicare exam.  Men eligible for this screening include those men between age 67-46 years of age who have smoked at least 100 cigarettes in his lifetime and/or has a family history of AAA.  HIV Screening:  Medicare allows for yearly screening for patients at high risk for contracting HIV disease.

## 2016-02-20 ENCOUNTER — Other Ambulatory Visit: Payer: Self-pay | Admitting: Family Medicine

## 2016-02-20 ENCOUNTER — Encounter: Payer: Self-pay | Admitting: Internal Medicine

## 2016-02-20 DIAGNOSIS — Z1231 Encounter for screening mammogram for malignant neoplasm of breast: Secondary | ICD-10-CM

## 2016-02-23 ENCOUNTER — Encounter: Payer: Self-pay | Admitting: Podiatry

## 2016-02-23 ENCOUNTER — Ambulatory Visit (INDEPENDENT_AMBULATORY_CARE_PROVIDER_SITE_OTHER): Payer: Medicare Other | Admitting: Podiatry

## 2016-02-23 ENCOUNTER — Ambulatory Visit (INDEPENDENT_AMBULATORY_CARE_PROVIDER_SITE_OTHER): Payer: Medicare Other

## 2016-02-23 ENCOUNTER — Ambulatory Visit: Payer: Medicare Other

## 2016-02-23 VITALS — BP 148/81 | HR 73 | Resp 16 | Ht 60.0 in | Wt 143.0 lb

## 2016-02-23 DIAGNOSIS — M79672 Pain in left foot: Secondary | ICD-10-CM | POA: Diagnosis not present

## 2016-02-23 DIAGNOSIS — M79671 Pain in right foot: Secondary | ICD-10-CM

## 2016-02-23 DIAGNOSIS — M779 Enthesopathy, unspecified: Secondary | ICD-10-CM | POA: Diagnosis not present

## 2016-02-23 DIAGNOSIS — M6789 Other specified disorders of synovium and tendon, multiple sites: Secondary | ICD-10-CM | POA: Diagnosis not present

## 2016-02-23 DIAGNOSIS — M76829 Posterior tibial tendinitis, unspecified leg: Secondary | ICD-10-CM

## 2016-02-23 MED ORDER — TRIAMCINOLONE ACETONIDE 10 MG/ML IJ SUSP
10.0000 mg | Freq: Once | INTRAMUSCULAR | Status: AC
  Administered 2016-02-23: 10 mg

## 2016-02-23 NOTE — Progress Notes (Signed)
Subjective:     Patient ID: Stacy Decker, female   DOB: December 09, 1950, 65 y.o.   MRN: [id number]  HPI patient presents with pain in the dorsum of the left foot and also complete flatfoot deformity left which gotten worse over the last couple years with probable dysfunction of her tendon. States that her foot and leg get very tired especially if she tries to be active   Review of Systems  All other systems reviewed and are negative.      Objective:   Physical Exam  Constitutional: She is oriented to person, place, and time.  Cardiovascular: Intact distal pulses.   Musculoskeletal: Normal range of motion.  Neurological: She is oriented to person, place, and time.  Skin: Skin is warm.  Nursing note and vitals reviewed.  Neurovascular status intact muscle strength adequate range of motion within normal limits with patient found to have dorsal tendinitis around the first metatarsocuneiform joint and also discomfort around posterior tibial tendon with complete collapse of the arch left with dysfunction posterior tib and chronic symptoms associated with it. Patient's found have good digital perfusion is well oriented 3     Assessment:     Collapse medial longitudinal arch left secondary to posterior tibial tendon dysfunction and dorsal tendinitis with arthritis    Plan:     H&P x-rays reviewed and injected the dorsal tendon complex 3 mg Kenalog 5 mill grams Xylocaine and discussed long-term me so or AFO bracing to try to support the arch and take up for the dysfunctional posterior tibial tendon. Patient is scheduled for bracing and will be seen back in the next several weeks for bracing treatment  X-ray report indicated complete collapse medial longitudinal arch left

## 2016-02-23 NOTE — Progress Notes (Signed)
   Subjective:    Patient ID: Stacy Decker, female    DOB: [birth year], 64 y.o.   MRN: [id number]  HPI Chief Complaint  Patient presents with  . Foot Pain    Left foot; dorsal; pt stated, "has on and off pain"; x1 yr      Review of Systems  All other systems reviewed and are negative.      Objective:   Physical Exam        Assessment & Plan:

## 2016-02-26 ENCOUNTER — Telehealth: Payer: Self-pay | Admitting: Family Medicine

## 2016-02-26 ENCOUNTER — Other Ambulatory Visit: Payer: Medicare Other

## 2016-02-26 NOTE — Telephone Encounter (Signed)
Pt's husband came in and dropped off advanced directives. Please review and return to [facility]

## 2016-03-01 ENCOUNTER — Telehealth: Payer: Self-pay | Admitting: Podiatry

## 2016-03-01 ENCOUNTER — Ambulatory Visit: Payer: Medicare Other | Admitting: Podiatry

## 2016-03-01 NOTE — Telephone Encounter (Signed)
Pt left vm on 10.5 at 544pm asking for an appt today because she is still having a lot of left foot pain and can hardly walk.She requested and appt before 1pm..   I called and lvm for pt to call to schedule an appt. I was looking at an appt around 945am

## 2016-03-05 ENCOUNTER — Ambulatory Visit
Admission: RE | Admit: 2016-03-05 | Discharge: 2016-03-05 | Disposition: A | Discharge status: Home / Self Care | Payer: Medicare Other | Source: Ambulatory Visit | Attending: Family Medicine | Admitting: Family Medicine

## 2016-03-05 DIAGNOSIS — M810 Age-related osteoporosis without current pathological fracture: Secondary | ICD-10-CM | POA: Diagnosis not present

## 2016-03-05 DIAGNOSIS — Z8739 Personal history of other diseases of the musculoskeletal system and connective tissue: Secondary | ICD-10-CM

## 2016-03-05 DIAGNOSIS — Z1231 Encounter for screening mammogram for malignant neoplasm of breast: Secondary | ICD-10-CM | POA: Diagnosis not present

## 2016-03-05 DIAGNOSIS — Z780 Asymptomatic menopausal state: Secondary | ICD-10-CM | POA: Diagnosis not present

## 2016-03-05 DIAGNOSIS — E2839 Other primary ovarian failure: Secondary | ICD-10-CM

## 2016-03-12 ENCOUNTER — Encounter: Payer: Self-pay | Admitting: Family Medicine

## 2016-03-12 ENCOUNTER — Ambulatory Visit (INDEPENDENT_AMBULATORY_CARE_PROVIDER_SITE_OTHER): Payer: Medicare Other | Admitting: Family Medicine

## 2016-03-12 VITALS — BP 132/80 | HR 67 | Wt 149.2 lb

## 2016-03-12 DIAGNOSIS — M810 Age-related osteoporosis without current pathological fracture: Secondary | ICD-10-CM | POA: Diagnosis not present

## 2016-03-12 MED ORDER — ALENDRONATE SODIUM 70 MG PO TABS: 70.0000 mg | ORAL_TABLET | ORAL | 11 refills | Status: DC

## 2016-03-12 NOTE — Progress Notes (Signed)
   Subjective:    Patient ID: Stacy Decker, female    DOB: 06-24-1950, 65 y.o.   MRN: [id number]  HPI Chief Complaint  Patient presents with  . bone density results    discuss results   She is here to discuss abnormal bone density result. She has a T score of -2.8. States she was told several years ago in [employer] that she had osteoporosis but she did not start medication. Denies having a fracture in the past.  She is taking calcium and vitamin D occasionally.  Denies recent fever, chills, difficulty swallowing, chest pain, palpitations, abdominal pain, N/V/D.  Denies history of esophageal stricture or issues.   Past Medical History:  Diagnosis Date  . Anxiety   . Depression   . History of vertigo   . Substance abuse    recovering alcoholic   Past Surgical History:  Procedure Laterality Date  . carpel tunnel      Reviewed allergies, medications, past medical, surgical,, and social history.  Review of Systems Pertinent positives and negatives in the history of present illness.     Objective:   Physical Exam BP 132/80   Pulse 67   Wt 149 lb 3.2 oz (67.7 kg)   BMI 29.14 kg/m  Alert and in no distress. Not otherwise examined.      Assessment & Plan:  Osteoporosis without current pathological fracture, unspecified osteoporosis type - Plan: VITAMIN D 25 Hydroxy (Vit-D Deficiency, Fractures), alendronate (FOSAMAX) 70 MG tablet  Discussed diagnosis of osteoporosis and importance of preventing fracture in the future. Counseled on lifestyle modifications such as weight bearing exercise, getting 1,200 mg of calcium per day from all sources and vitamin D supplement. Plan to check her vitamin D level and determine amount of vitamin D she needs.  Counseled on how to correctly take Fosamax and possible side effects. Answered all of her and her husband's questions.  Will follow up pending labs.

## 2016-03-12 NOTE — Patient Instructions (Addendum)
Make sure you are getting 1,200 mg of daily calcium from all sources including diet.  I am checking your Vitamin D level today and will call you with results to determine how much vitamin D you should be taking.  Take the alendronate once weekly when you can sit upright for 45 minutes and do not take any other medications or food/beverages other than water.   Alendronate tablets What is this medicine? ALENDRONATE (a LEN droe nate) slows calcium loss from bones. It helps to make normal healthy bone and to slow bone loss in people with Paget's disease and osteoporosis. It may be used in others at risk for bone loss. This medicine may be used for other purposes; ask your health care provider or pharmacist if you have questions. What should I tell my health care provider before I take this medicine? They need to know if you have any of these conditions: -dental disease -esophagus, stomach, or intestine problems, like acid reflux or GERD -kidney disease -low blood calcium -low vitamin D -problems sitting or standing 30 minutes -trouble swallowing -an unusual or allergic reaction to alendronate, other medicines, foods, dyes, or preservatives -pregnant or trying to get pregnant -breast-feeding How should I use this medicine? You must take this medicine exactly as directed or you will lower the amount of the medicine you absorb into your body or you may cause yourself harm. Take this medicine by mouth first thing in the morning, after you are up for the day. Do not eat or drink anything before you take your medicine. Swallow the tablet with a full glass (6 to 8 fluid ounces) of plain water. Do not take this medicine with any other drink. Do not chew or crush the tablet. After taking this medicine, do not eat breakfast, drink, or take any medicines or vitamins for at least 30 minutes. Sit or stand up for at least 30 minutes after you take this medicine; do not lie down. Do not take your medicine more  often than directed. Talk to your pediatrician regarding the use of this medicine in children. Special care may be needed. Overdosage: If you think you have taken too much of this medicine contact a poison control center or emergency room at once. NOTE: This medicine is only for you. Do not share this medicine with others. What if I miss a dose? If you miss a dose, do not take it later in the day. Continue your normal schedule starting the next morning. Do not take double or extra doses. What may interact with this medicine? -aluminum hydroxide -antacids -aspirin -calcium supplements -drugs for inflammation like ibuprofen, naproxen, and others -iron supplements -magnesium supplements -vitamins with minerals This list may not describe all possible interactions. Give your health care provider a list of all the medicines, herbs, non-prescription drugs, or dietary supplements you use. Also tell them if you smoke, drink alcohol, or use illegal drugs. Some items may interact with your medicine. What should I watch for while using this medicine? Visit your doctor or health care professional for regular checks ups. It may be some time before you see benefit from this medicine. Do not stop taking your medicine except on your doctor's advice. Your doctor or health care professional may order blood tests and other tests to see how you are doing. You should make sure you get enough calcium and vitamin D while you are taking this medicine, unless your doctor tells you not to. Discuss the foods you eat and the vitamins  you take with your health care professional. Some people who take this medicine have severe bone, joint, and/or muscle pain. This medicine may also increase your risk for a broken thigh bone. Tell your doctor right away if you have pain in your upper leg or groin. Tell your doctor if you have any pain that does not go away or that gets worse. This medicine can make you more sensitive to the sun.  If you get a rash while taking this medicine, sunlight may cause the rash to get worse. Keep out of the sun. If you cannot avoid being in the sun, wear protective clothing and use sunscreen. Do not use sun lamps or tanning beds/booths. What side effects may I notice from receiving this medicine? Side effects that you should report to your doctor or health care professional as soon as possible: -allergic reactions like skin rash, itching or hives, swelling of the face, lips, or tongue -black or tarry stools -bone, muscle or joint pain -changes in vision -chest pain -heartburn or stomach pain -jaw pain, especially after dental work -pain or trouble when swallowing -redness, blistering, peeling or loosening of the skin, including inside the mouth Side effects that usually do not require medical attention (report to your doctor or health care professional if they continue or are bothersome): -changes in taste -diarrhea or constipation -eye pain or itching -headache -nausea or vomiting -stomach gas or fullness This list may not describe all possible side effects. Call your doctor for medical advice about side effects. You may report side effects to FDA at 1-800-FDA-1088. Where should I keep my medicine? Keep out of the reach of children. Store at room temperature of 15 and 30 degrees C (59 and 86 degrees F). Throw away any unused medicine after the expiration date. NOTE: This sheet is a summary. It may not cover all possible information. If you have questions about this medicine, talk to your doctor, pharmacist, or health care provider.    2016, Elsevier/Gold Standard. (2010-11-09 08:56:09)    Osteoporosis Osteoporosis is the thinning and loss of density in the bones. Osteoporosis makes the bones more brittle, fragile, and likely to break (fracture). Over time, osteoporosis can cause the bones to become so weak that they fracture after a simple fall. The bones most likely to fracture are  the bones in the hip, wrist, and spine. CAUSES  The exact cause is not known. RISK FACTORS Anyone can develop osteoporosis. You may be at greater risk if you have a family history of the condition or have poor nutrition. You may also have a higher risk if you are:   Female.   30 years old or older.  A smoker.  Not physically active.   White or Asian.  Slender. SIGNS AND SYMPTOMS  A fracture might be the first sign of the disease, especially if it results from a fall or injury that would not usually cause a bone to break. Other signs and symptoms include:   Low back and neck pain.  Stooped posture.  Height loss. DIAGNOSIS  To make a diagnosis, your health care provider may:  Take a medical history.  Perform a physical exam.  Order tests, such as:  A bone mineral density test.  A dual-energy X-ray absorptiometry test. TREATMENT  The goal of osteoporosis treatment is to strengthen your bones to reduce your risk of a fracture. Treatment may involve:  Making lifestyle changes, such as:  Eating a diet rich in calcium.  Doing weight-bearing and muscle-strengthening  exercises.  Stopping tobacco use.  Limiting alcohol intake.  Taking medicine to slow the process of bone loss or to increase bone density.  Monitoring your levels of calcium and vitamin D. HOME CARE INSTRUCTIONS  Include calcium and vitamin D in your diet. Calcium is important for bone health, and vitamin D helps the body absorb calcium.  Perform weight-bearing and muscle-strengthening exercises as directed by your health care provider.  Do not use any tobacco products, including cigarettes, chewing tobacco, and electronic cigarettes. If you need help quitting, ask your health care provider.  Limit your alcohol intake.  Take medicines only as directed by your health care provider.  Keep all follow-up visits as directed by your health care provider. This is important.  Take precautions at home  to lower your risk of falling, such as:  Keeping rooms well lit and clutter free.  Installing safety rails on stairs.  Using rubber mats in the bathroom and other areas that are often wet or slippery. SEEK IMMEDIATE MEDICAL CARE IF:  You fall or injure yourself.    This information is not intended to replace advice given to you by your health care provider. Make sure you discuss any questions you have with your health care provider.   Document Released: 02/20/2005 Document Revised: 06/03/2014 Document Reviewed: 10/21/2013 Elsevier Interactive Patient Education [employer].

## 2016-03-13 LAB — VITAMIN D 25 HYDROXY (VIT D DEFICIENCY, FRACTURES): VIT D 25 HYDROXY: 58 ng/mL (ref 30–100)

## 2016-03-22 ENCOUNTER — Ambulatory Visit (INDEPENDENT_AMBULATORY_CARE_PROVIDER_SITE_OTHER): Payer: Medicare Other | Admitting: Family Medicine

## 2016-03-22 VITALS — BP 130/70 | HR 74

## 2016-03-22 DIAGNOSIS — R2232 Localized swelling, mass and lump, left upper limb: Secondary | ICD-10-CM | POA: Diagnosis not present

## 2016-03-22 NOTE — Progress Notes (Signed)
   Subjective:    Patient ID: Stacy Decker, female    DOB: 1951-04-04, 65 y.o.   MRN: [id number]  HPI Chief Complaint  Patient presents with  . knot    achy knot noticied it wednesday night.    She is here with complaints of a knot that just appeared approximately 7-8 days ago to her left medial wrist. No known injury. Reports she is having pain to that area and has been taking anti-inflammatories for pain control. No redness or inflammation noted.   Denies fever, chills, numbness, tingling, weakness.     Review of Systems Pertinent positives and negatives in the history of present illness.     Objective:   Physical Exam  Constitutional: She appears well-developed and well-nourished. No distress.  Musculoskeletal:       Left wrist: She exhibits normal range of motion and no tenderness.       Arms:      Left hand: Normal. Normal sensation noted. Normal strength noted.  1.5 cm smooth, round, non tender cyst-like mass to her left radial wrist area. No erythema,    BP 130/70   Pulse 74       Assessment & Plan:  Mass of left wrist  Discussed that her LUE is neurovascularly intact. Suspect that this is a cyst and benign but since she is having pain I am referring her to the hand specialist for further evaluation.

## 2016-03-25 ENCOUNTER — Telehealth: Payer: Self-pay | Admitting: Family Medicine

## 2016-03-25 NOTE — Telephone Encounter (Signed)
The numbness she is having does not sound like it could be related but we will let the hand specialist check this out next week. I also ran her symptoms by my supervising physician and he does not feel that her symptoms are emergent. Chest pain should not be related to the symptoms either. If she is concerned then have her come in and see me.

## 2016-03-25 NOTE — Telephone Encounter (Signed)
Please find out which part of her hand is going numb. There really isn't a nerve beneath the area where the cyst popped up. When his her appointment with the hand specialist?

## 2016-03-25 NOTE — Telephone Encounter (Signed)
Pt said her hand with the knot started going numb yesterday. She wants to know what she need to do

## 2016-03-25 NOTE — Telephone Encounter (Signed)
Pt made aware of recommendations. She is agreeable. She states chest pain was very short in duration, and she will callback if this returns. Stacy Decker

## 2016-03-25 NOTE — Telephone Encounter (Signed)
Pt states that it is going numb in thumb and first finger. Pt states she sees hand specialist Nov 6th. Pt states that she is still having pain at the knot.  Pt states she has had a bout of chest pain randomly this morning. Not sure if this is related. Pt states she did not have any nausea, dyspnea or sweats. Stacy Decker

## 2016-03-29 DIAGNOSIS — F411 Generalized anxiety disorder: Secondary | ICD-10-CM | POA: Diagnosis not present

## 2016-03-29 DIAGNOSIS — F321 Major depressive disorder, single episode, moderate: Secondary | ICD-10-CM | POA: Diagnosis not present

## 2016-04-01 ENCOUNTER — Other Ambulatory Visit: Payer: Medicare Other

## 2016-04-01 DIAGNOSIS — M25532 Pain in left wrist: Secondary | ICD-10-CM | POA: Diagnosis not present

## 2016-04-01 DIAGNOSIS — M67432 Ganglion, left wrist: Secondary | ICD-10-CM | POA: Diagnosis not present

## 2016-04-01 DIAGNOSIS — M25531 Pain in right wrist: Secondary | ICD-10-CM | POA: Diagnosis not present

## 2016-04-11 ENCOUNTER — Encounter: Payer: Self-pay | Admitting: Podiatry

## 2016-04-11 DIAGNOSIS — M79672 Pain in left foot: Secondary | ICD-10-CM | POA: Diagnosis not present

## 2016-04-11 DIAGNOSIS — M779 Enthesopathy, unspecified: Secondary | ICD-10-CM | POA: Diagnosis not present

## 2016-04-11 DIAGNOSIS — M2142 Flat foot [pes planus] (acquired), left foot: Secondary | ICD-10-CM | POA: Diagnosis not present

## 2016-04-23 ENCOUNTER — Encounter: Payer: Self-pay | Admitting: Family Medicine

## 2016-04-23 DIAGNOSIS — G5602 Carpal tunnel syndrome, left upper limb: Secondary | ICD-10-CM | POA: Insufficient documentation

## 2016-04-23 DIAGNOSIS — M6740 Ganglion, unspecified site: Secondary | ICD-10-CM | POA: Diagnosis not present

## 2016-04-23 DIAGNOSIS — M25532 Pain in left wrist: Secondary | ICD-10-CM | POA: Diagnosis not present

## 2016-04-23 DIAGNOSIS — M25531 Pain in right wrist: Secondary | ICD-10-CM | POA: Diagnosis not present

## 2016-04-26 ENCOUNTER — Encounter: Payer: Self-pay | Admitting: Gastroenterology

## 2016-04-29 ENCOUNTER — Other Ambulatory Visit: Payer: Medicare Other

## 2016-04-29 ENCOUNTER — Other Ambulatory Visit: Payer: Self-pay

## 2016-04-29 DIAGNOSIS — G5602 Carpal tunnel syndrome, left upper limb: Secondary | ICD-10-CM | POA: Diagnosis not present

## 2016-04-29 DIAGNOSIS — M67432 Ganglion, left wrist: Secondary | ICD-10-CM | POA: Diagnosis not present

## 2016-04-29 DIAGNOSIS — D2112 Benign neoplasm of connective and other soft tissue of left upper limb, including shoulder: Secondary | ICD-10-CM | POA: Diagnosis not present

## 2016-04-29 DIAGNOSIS — M7989 Other specified soft tissue disorders: Secondary | ICD-10-CM | POA: Diagnosis not present

## 2016-05-06 ENCOUNTER — Encounter: Payer: Self-pay | Admitting: Podiatry

## 2016-05-06 ENCOUNTER — Ambulatory Visit (INDEPENDENT_AMBULATORY_CARE_PROVIDER_SITE_OTHER): Payer: Medicare Other | Admitting: Podiatry

## 2016-05-06 DIAGNOSIS — M779 Enthesopathy, unspecified: Secondary | ICD-10-CM

## 2016-05-06 DIAGNOSIS — M2140 Flat foot [pes planus] (acquired), unspecified foot: Secondary | ICD-10-CM

## 2016-05-06 DIAGNOSIS — M76829 Posterior tibial tendinitis, unspecified leg: Secondary | ICD-10-CM

## 2016-05-07 NOTE — Progress Notes (Signed)
Subjective:     Patient ID: Stacy Decker, female   DOB: 07-Jul-1950, 65 y.o.   MRN: [id number]  HPI patient states the injection was helpful for her left foot but she is having trouble with the brace and having trouble finding shoe gear that fit   Review of Systems     Objective:   Physical Exam Neurological status intact with collapse medial longitudinal arch left dorsal tendinitis that's improved and brace that fits well but is not been accommodated by shoe gear    Assessment:     Inflammatory changes with brace that is fitted well but is not so far satisfactory    Plan:     Advised on anti-inflammatories and continued brace usage and reappoint for [place] to see back again in approximately 6 weeks and to review this case with

## 2016-06-03 ENCOUNTER — Other Ambulatory Visit: Payer: Medicare Other

## 2016-06-11 DIAGNOSIS — F3341 Major depressive disorder, recurrent, in partial remission: Secondary | ICD-10-CM | POA: Diagnosis not present

## 2016-06-11 DIAGNOSIS — F1011 Alcohol abuse, in remission: Secondary | ICD-10-CM | POA: Diagnosis not present

## 2016-06-11 DIAGNOSIS — F1211 Cannabis abuse, in remission: Secondary | ICD-10-CM | POA: Diagnosis not present

## 2016-06-18 ENCOUNTER — Ambulatory Visit (INDEPENDENT_AMBULATORY_CARE_PROVIDER_SITE_OTHER): Payer: Medicare Other | Admitting: Gastroenterology

## 2016-06-18 ENCOUNTER — Encounter: Payer: Self-pay | Admitting: Gastroenterology

## 2016-06-18 VITALS — BP 130/90 | HR 84 | Ht 60.75 in | Wt 149.4 lb

## 2016-06-18 DIAGNOSIS — R198 Other specified symptoms and signs involving the digestive system and abdomen: Secondary | ICD-10-CM

## 2016-06-18 DIAGNOSIS — Z1211 Encounter for screening for malignant neoplasm of colon: Secondary | ICD-10-CM

## 2016-06-18 MED ORDER — NA SULFATE-K SULFATE-MG SULF 17.5-3.13-1.6 GM/177ML PO SOLN: 1.0000 | Freq: Once | ORAL | 0 refills | Status: AC

## 2016-06-18 NOTE — Progress Notes (Signed)
HPI: This is a  very pleasant 66 year old woman who was referred to me by Girtha Rm, NP  to evaluate  colon cancer screening, irregular bowel habits .    Chief complaint is routine risk for colon cancer, irregular bowel habits.  Old records reviewed; Colonoscopy 2003 [place] for colon cancer screening was normal except internal hemorrhoids.  Recall at 10 years.  FOBT testing since then for two years was negative.  She also explains that she has BMs several solid BMs daily, non-bloody.  No changes.  Has tried miralax in the past.  Overall weight is up 20 pounds in 1-2 years.  No FH of colon cancer.  No nausea, no vomiting, no significant abdominal pains.  She moved to [facility] in June 2016.     Review of systems: Pertinent positive and negative review of systems were noted in the above HPI section. Complete review of systems was performed and was otherwise normal.   Past Medical History:  Diagnosis Date  . Anxiety   . Depression   . History of vertigo   . Osteoporosis   . Substance abuse    recovering alcoholic    Past Surgical History:  Procedure Laterality Date  . carpel tunnel    . PLACEMENT OF BREAST IMPLANTS      Current Outpatient Prescriptions  Medication Sig Dispense Refill  . alendronate (FOSAMAX) 70 MG tablet Take 1 tablet (70 mg total) by mouth every 7 (seven) days. Take with a full glass of water on an empty stomach. 4 tablet 11  . aspirin EC 81 MG tablet Take 81 mg by mouth daily.    Marland Kitchen buPROPion (WELLBUTRIN XL) 150 MG 24 hr tablet Take 150 mg by mouth daily.    . Calcium Citrate-Vitamin D (CITRACAL/VITAMIN D PO) Take 1 tablet by mouth daily.    . citalopram (CELEXA) 40 MG tablet Take 20 mg by mouth daily.     . Glucos-Chond-Sterol-Fish Oil (GLUCOSAMINE CHONDROITIN PLUS PO) Take 2 tablets by mouth daily.    . Multiple Vitamin (MULTIVITAMIN) tablet Take 1 tablet by mouth daily.    . Naproxen Sodium (ALEVE PO) Take by mouth at bedtime.      No  current facility-administered medications for this visit.     Allergies as of 06/18/2016  . (No Known Allergies)    Family History  Problem Relation Age of Onset  . Hypertension Mother   . Cancer Mother     breast in remission age 38  . Diabetes Mother   . Diabetes Paternal Grandmother   . Dementia Father     UTIs   . Other Father     brain tumor  . Other Sister     hip replacements  . Alcohol abuse Sister   . Other Sister     breast cysts- benign  . Colon cancer Neg Hx   . Stomach cancer Neg Hx   . Rectal cancer Neg Hx   . Esophageal cancer Neg Hx   . Liver cancer Neg Hx     Social History   Social History  . Marital status: Married    Spouse name: N/A  . Number of children: 0  . Years of education: N/A   Occupational History  . Therapy asst At [employer] History Main Topics  . Smoking status: Former Smoker    Quit date: 06/28/1996  . Smokeless tobacco: Never Used  . Alcohol use No     Comment: recovering alcoholic quit  in 1988  . Drug use: No     Comment: former marihuana   . Sexual activity: Not Currently    Birth control/ protection: Post-menopausal   Other Topics Concern  . Not on file   Social History Narrative  . No narrative on file     Physical Exam: Ht 5' 0.75" (1.543 m)   Wt 149 lb 6 oz (67.8 kg)   BMI 28.46 kg/m  Constitutional: generally well-appearing Psychiatric: alert and oriented x3 Eyes: extraocular movements intact Mouth: oral pharynx moist, no lesions Neck: supple no lymphadenopathy Cardiovascular: heart regular rate and rhythm Lungs: clear to auscultation bilaterally Abdomen: soft, nontender, nondistended, no obvious ascites, no peritoneal signs, normal bowel sounds Extremities: no lower extremity edema bilaterally Skin: no lesions on visible extremities   Assessment and plan: 66 y.o. female with  Routine risk for colon cancer, irregular bowel habits  We discussed colon cancer screening. She understands  that she can have fecal occult blood testing annually and if any are positive then she needs a colonoscopy. Alternatively colonoscopy can be done and if it is negative she would not need repeat colon cancer screening for 10 years. She elected to go with that method and so we will arrange for screening colonoscopy at her soonest convenience. We also discussed a different issue, she has irregular bowel habits for at least several years. She describes several small solid stools per day, nonbloody. MiraLAX has been trying the past but doesn't seem to help. She has never tried fiber supplements and I explained to her that that is usually my first recommendation for bowel habits such as hers. She is going to therefore start Citrucel orange flavored powder supplement once daily.  I see no reason for any further blood tests or imaging studies at this time.  Please see the "Patient Instructions" section for addition details about the plan.   Owens Loffler, MD [facility] 06/18/2016, 2:39 PM  Cc: Girtha Rm, NP

## 2016-06-18 NOTE — Patient Instructions (Signed)
Please start taking citrucel (orange flavored) powder fiber supplement.  This may cause some bloating at first but that usually goes away. Begin with a small spoonful and work your way up to a large, heaping spoonful daily over a week. You will be set up for a colonoscopy for colon cancer screening.

## 2016-06-20 ENCOUNTER — Telehealth: Payer: Self-pay | Admitting: Gastroenterology

## 2016-06-20 NOTE — Telephone Encounter (Signed)
The pt will try and get the supplemental insurance to pay on suprep

## 2016-06-25 DIAGNOSIS — F1211 Cannabis abuse, in remission: Secondary | ICD-10-CM | POA: Diagnosis not present

## 2016-06-25 DIAGNOSIS — F1011 Alcohol abuse, in remission: Secondary | ICD-10-CM | POA: Diagnosis not present

## 2016-06-25 DIAGNOSIS — F3341 Major depressive disorder, recurrent, in partial remission: Secondary | ICD-10-CM | POA: Diagnosis not present

## 2016-07-02 DIAGNOSIS — F321 Major depressive disorder, single episode, moderate: Secondary | ICD-10-CM | POA: Diagnosis not present

## 2016-07-02 DIAGNOSIS — F411 Generalized anxiety disorder: Secondary | ICD-10-CM | POA: Diagnosis not present

## 2016-07-29 ENCOUNTER — Encounter: Payer: Self-pay | Admitting: Gastroenterology

## 2016-07-29 ENCOUNTER — Ambulatory Visit (AMBULATORY_SURGERY_CENTER): Payer: Medicare Other | Admitting: Gastroenterology

## 2016-07-29 VITALS — BP 132/68 | HR 79 | Temp 98.4°F | Resp 21 | Ht 59.75 in | Wt 149.0 lb

## 2016-07-29 DIAGNOSIS — Z1212 Encounter for screening for malignant neoplasm of rectum: Secondary | ICD-10-CM | POA: Diagnosis not present

## 2016-07-29 DIAGNOSIS — D128 Benign neoplasm of rectum: Secondary | ICD-10-CM

## 2016-07-29 DIAGNOSIS — E669 Obesity, unspecified: Secondary | ICD-10-CM | POA: Diagnosis not present

## 2016-07-29 DIAGNOSIS — K5730 Diverticulosis of large intestine without perforation or abscess without bleeding: Secondary | ICD-10-CM

## 2016-07-29 DIAGNOSIS — Z1211 Encounter for screening for malignant neoplasm of colon: Secondary | ICD-10-CM | POA: Diagnosis not present

## 2016-07-29 DIAGNOSIS — D129 Benign neoplasm of anus and anal canal: Secondary | ICD-10-CM

## 2016-07-29 DIAGNOSIS — K621 Rectal polyp: Secondary | ICD-10-CM | POA: Diagnosis not present

## 2016-07-29 DIAGNOSIS — F329 Major depressive disorder, single episode, unspecified: Secondary | ICD-10-CM | POA: Diagnosis not present

## 2016-07-29 MED ORDER — SODIUM CHLORIDE 0.9 % IV SOLN: 500.0000 mL | INTRAVENOUS | Status: DC

## 2016-07-29 NOTE — Progress Notes (Signed)
Called to room to assist during endoscopic procedure.  Patient ID and intended procedure confirmed with present staff. Received instructions for my participation in the procedure from the performing physician.  

## 2016-07-29 NOTE — Op Note (Signed)
[facility] Patient Name: Stacy Decker Procedure Date: 07/29/2016 3:00 PM MRN: [id number] Endoscopist: Milus Banister , MD Age: 66 Referring MD:  Date of Birth: [birth year] Gender: Female Account #: [id number] Procedure:                Colonoscopy Indications:              Screening for colorectal malignant neoplasm Medicines:                Monitored Anesthesia Care Procedure:                Pre-Anesthesia Assessment:                           - Prior to the procedure, a History and Physical                            was performed, and patient medications and                            allergies were reviewed. The patient's tolerance of                            previous anesthesia was also reviewed. The risks                            and benefits of the procedure and the sedation                            options and risks were discussed with the patient.                            All questions were answered, and informed consent                            was obtained. Prior Anticoagulants: The patient has                            taken no previous anticoagulant or antiplatelet                            agents. ASA Grade Assessment: II - A patient with                            mild systemic disease. After reviewing the risks                            and benefits, the patient was deemed in                            satisfactory condition to undergo the procedure.                           After obtaining informed consent, the colonoscope  was passed under direct vision. Throughout the                            procedure, the patient's blood pressure, pulse, and                            oxygen saturations were monitored continuously. The                            Colonoscope was introduced through the anus and                            advanced to the the cecum, identified by                            appendiceal orifice and  ileocecal valve. The                            colonoscopy was performed without difficulty. The                            patient tolerated the procedure well. The quality                            of the bowel preparation was good. The ileocecal                            valve, appendiceal orifice, and rectum were                            photographed. Scope In: 3:07:03 PM Scope Out: 3:18:59 PM Scope Withdrawal Time: 0 hours 8 minutes 32 seconds  Total Procedure Duration: 0 hours 11 minutes 56 seconds  Findings:                 A 3 mm polyp was found in the rectum. The polyp was                            sessile. The polyp was removed with a cold snare.                            Resection and retrieval were complete.                           Multiple small-mouthed diverticula were found in                            the left colon.                           The exam was otherwise without abnormality on                            direct and retroflexion views. Complications:  No immediate complications. Estimated blood loss:                            None. Estimated Blood Loss:     Estimated blood loss: none. Impression:               - One 3 mm polyp in the rectum, removed with a cold                            snare. Resected and retrieved.                           - Diverticulosis in the left colon.                           - The examination was otherwise normal on direct                            and retroflexion views. Recommendation:           - Patient has a contact number available for                            emergencies. The signs and symptoms of potential                            delayed complications were discussed with the                            patient. Return to normal activities tomorrow.                            Written discharge instructions were provided to the                            patient.                           - Resume  previous diet.                           - Continue present medications.                           You will receive a letter within 2-3 weeks with the                            pathology results and my final recommendations.                           If the polyp(s) is proven to be 'pre-cancerous' on                            pathology, you will need repeat colonoscopy in 5  years. If the polyp(s) is NOT 'precancerous' on                            pathology then you should repeat colon cancer                            screening in 10 years with colonoscopy without need                            for colon cancer screening by any method prior to                            then (including stool testing). Milus Banister, MD 07/29/2016 3:20:54 PM This report has been signed electronically.

## 2016-07-29 NOTE — Progress Notes (Signed)
Report to PACU, RN, vss, BBS= Clear.  

## 2016-07-29 NOTE — Patient Instructions (Signed)
YOU HAD AN ENDOSCOPIC PROCEDURE TODAY AT [facility]:   Refer to the procedure report that was given to you for any specific questions about what was found during the examination.  If the procedure report does not answer your questions, please call your gastroenterologist to clarify.  If you requested that your care partner not be given the details of your procedure findings, then the procedure report has been included in a sealed envelope for you to review at your convenience later.  YOU SHOULD EXPECT: Some feelings of bloating in the abdomen. Passage of more gas than usual.  Walking can help get rid of the air that was put into your GI tract during the procedure and reduce the bloating. If you had a lower endoscopy (such as a colonoscopy or flexible sigmoidoscopy) you may notice spotting of blood in your stool or on the toilet paper. If you underwent a bowel prep for your procedure, you may not have a normal bowel movement for a few days.  Please Note:  You might notice some irritation and congestion in your nose or some drainage.  This is from the oxygen used during your procedure.  There is no need for concern and it should clear up in a day or so.  SYMPTOMS TO REPORT IMMEDIATELY:   Following lower endoscopy (colonoscopy or flexible sigmoidoscopy):  Excessive amounts of blood in the stool  Significant tenderness or worsening of abdominal pains  Swelling of the abdomen that is new, acute  Fever of 100F or higher   For urgent or emergent issues, a gastroenterologist can be reached at any hour by calling [phone].   DIET:  We do recommend a small meal at first, but then you may proceed to your regular diet.  Drink plenty of fluids but you should avoid alcoholic beverages for 24 hours.  ACTIVITY:  You should plan to take it easy for the rest of today and you should NOT DRIVE or use heavy machinery until tomorrow (because of the sedation medicines used during the test).     FOLLOW UP: Our staff will call the number listed on your records the next business day following your procedure to check on you and address any questions or concerns that you may have regarding the information given to you following your procedure. If we do not reach you, we will leave a message.  However, if you are feeling well and you are not experiencing any problems, there is no need to return our call.  We will assume that you have returned to your regular daily activities without incident.  If any biopsies were taken you will be contacted by phone or by letter within the next 1-3 weeks.  Please call us at [phone] if you have not heard about the biopsies in 3 weeks.   Polyps (handout given) Diverticulosis (handout given) High Fiber Diet (handout given) Await for biopsy results to determined next repeat Colonoscopy screening    SIGNATURES/CONFIDENTIALITY: You and/or your care partner have signed paperwork which will be entered into your electronic medical record.  These signatures attest to the fact that that the information above on your After Visit Summary has been reviewed and is understood.  Full responsibility of the confidentiality of this discharge information lies with you and/or your care-partner.

## 2016-07-30 ENCOUNTER — Telehealth: Payer: Self-pay | Admitting: *Deleted

## 2016-07-30 NOTE — Telephone Encounter (Signed)
  Follow up Call-  Call back number 07/29/2016  Post procedure Call Back phone  # [phone]  Permission to leave phone message Yes  Some recent data might be hidden    [facility]

## 2016-07-30 NOTE — Telephone Encounter (Signed)
  Follow up Call-  Call back number 07/29/2016  Post procedure Call Back phone  # [phone]  Permission to leave phone message Yes  Some recent data might be hidden     Patient questions:  Do you have a fever, pain , or abdominal swelling? No. Pain Score  0 *  Have you tolerated food without any problems? Yes.    Have you been able to return to your normal activities? Yes.    Do you have any questions about your discharge instructions: Diet   No. Medications  No. Follow up visit  No.  Do you have questions or concerns about your Care? No.  Actions: * If pain score is 4 or above: No action needed, pain <4.

## 2016-08-06 ENCOUNTER — Encounter: Payer: Self-pay | Admitting: Gastroenterology

## 2016-09-11 ENCOUNTER — Ambulatory Visit (INDEPENDENT_AMBULATORY_CARE_PROVIDER_SITE_OTHER): Payer: Medicare Other | Admitting: Family Medicine

## 2016-09-11 ENCOUNTER — Encounter: Payer: Self-pay | Admitting: Family Medicine

## 2016-09-11 VITALS — BP 140/90 | HR 83 | Temp 98.0°F | Wt 144.2 lb

## 2016-09-11 DIAGNOSIS — IMO0002 Reserved for concepts with insufficient information to code with codable children: Secondary | ICD-10-CM

## 2016-09-11 DIAGNOSIS — R030 Elevated blood-pressure reading, without diagnosis of hypertension: Secondary | ICD-10-CM

## 2016-09-11 DIAGNOSIS — R229 Localized swelling, mass and lump, unspecified: Secondary | ICD-10-CM | POA: Diagnosis not present

## 2016-09-11 NOTE — Progress Notes (Signed)
   Subjective:    Patient ID: Stacy Decker, female    DOB: November 29, 1950, 66 y.o.   MRN: [id number]  HPI Chief Complaint  Patient presents with  . knot    knot on rectal area-5 days   She is here with complaints of a 5 day history of feeling a bump or mass to the right of her vaginal and rectal area. Denies redness, swelling, drainage, tenderness. States she would not have known it was there except she felt it when wiping. States she noticed a whitish bump in the area but it has resolved.  Denies fever, chills, abdominal pain, N/V/D. No urinary or vaginal symptoms. No new sexual partners.  She is verbally and visibly upset about the recent split between her and her husband. States she is seeing her therapist and trying to move forward. She is going to [employer]. History of alcoholism but has not had a drink since the late 1990s.   Colonoscopy done on March 5th.  Denies history of MRSA. No diabetes but has history of prediabetes. Her last hemoglobin A1c was 5.3% in September 2017.   Reviewed allergies, medications, past medical, surgical, and social history.    Review of Systems Pertinent positives and negatives in the history of present illness.     Objective:   Physical Exam  Constitutional: She appears well-developed and well-nourished. No distress.  Genitourinary:    There is no rash, tenderness or lesion on the right labia. There is no rash, tenderness or lesion on the left labia.  Genitourinary Comments: Small, round and somewhat moveable cyst without tenderness, erythema, or induration. No sign of infection.   Skin: Skin is warm and dry. No rash noted. No erythema.   BP 140/90   Pulse 83   Temp 98 F (36.7 C)   Wt 144 lb 3.2 oz (65.4 kg)   BMI 28.40 kg/m        Assessment & Plan:  Mass  Elevated BP without diagnosis of hypertension  Discussed that the area feels cyst-like and no sign of infection. Plan to have her keep an eye on this and if she notices any new  symptoms or it becomes painful or bothersome she will let me know.  Encouraged her to continue seeing her therapist and try to let go of her anger/pain and move forward. She states she has a good support network and plans to forgive and move on in a more positive light.  She will check her BP and let me know if her readings continue being elevated.

## 2016-09-11 NOTE — Patient Instructions (Signed)
Check your blood pressure 2-3 times per week and let me know if your readings are not <130/80.   Keep an eye on the area you came in for today and if you notice any redness, swelling or tenderness then return.

## 2016-09-24 DIAGNOSIS — F321 Major depressive disorder, single episode, moderate: Secondary | ICD-10-CM | POA: Diagnosis not present

## 2016-09-24 DIAGNOSIS — F411 Generalized anxiety disorder: Secondary | ICD-10-CM | POA: Diagnosis not present

## 2016-12-06 DIAGNOSIS — F411 Generalized anxiety disorder: Secondary | ICD-10-CM | POA: Diagnosis not present

## 2016-12-06 DIAGNOSIS — F321 Major depressive disorder, single episode, moderate: Secondary | ICD-10-CM | POA: Diagnosis not present

## 2016-12-08 ENCOUNTER — Other Ambulatory Visit: Payer: Self-pay | Admitting: Family Medicine

## 2016-12-08 DIAGNOSIS — M810 Age-related osteoporosis without current pathological fracture: Secondary | ICD-10-CM

## 2016-12-09 NOTE — Telephone Encounter (Signed)
Is this okay to refill? 

## 2016-12-09 NOTE — Telephone Encounter (Signed)
ok 

## 2017-02-26 ENCOUNTER — Other Ambulatory Visit: Payer: Self-pay | Admitting: Family Medicine

## 2017-02-26 DIAGNOSIS — Z1231 Encounter for screening mammogram for malignant neoplasm of breast: Secondary | ICD-10-CM

## 2017-02-28 DIAGNOSIS — F411 Generalized anxiety disorder: Secondary | ICD-10-CM | POA: Diagnosis not present

## 2017-02-28 DIAGNOSIS — F321 Major depressive disorder, single episode, moderate: Secondary | ICD-10-CM | POA: Diagnosis not present

## 2017-03-02 ENCOUNTER — Other Ambulatory Visit: Payer: Self-pay | Admitting: Family Medicine

## 2017-03-02 DIAGNOSIS — M810 Age-related osteoporosis without current pathological fracture: Secondary | ICD-10-CM

## 2017-03-03 NOTE — Telephone Encounter (Signed)
Is this okay to refill? 

## 2017-03-03 NOTE — Telephone Encounter (Signed)
Ok to refill for 3 months and she needs a AWV and follow up on chronic health conditions in that time frame.

## 2017-03-03 NOTE — Telephone Encounter (Signed)
Pt is scheduled for dec 28th for med check + and follow-up on health concers. I will refill med

## 2017-03-11 DIAGNOSIS — G5752 Tarsal tunnel syndrome, left lower limb: Secondary | ICD-10-CM | POA: Diagnosis not present

## 2017-03-11 DIAGNOSIS — M659 Synovitis and tenosynovitis, unspecified: Secondary | ICD-10-CM | POA: Diagnosis not present

## 2017-03-11 DIAGNOSIS — M19072 Primary osteoarthritis, left ankle and foot: Secondary | ICD-10-CM | POA: Diagnosis not present

## 2017-03-14 ENCOUNTER — Ambulatory Visit
Admission: RE | Admit: 2017-03-14 | Discharge: 2017-03-14 | Disposition: A | Discharge status: Home / Self Care | Payer: Medicare Other | Source: Ambulatory Visit | Attending: Family Medicine | Admitting: Family Medicine

## 2017-03-14 DIAGNOSIS — Z1231 Encounter for screening mammogram for malignant neoplasm of breast: Secondary | ICD-10-CM

## 2017-03-17 DIAGNOSIS — G5752 Tarsal tunnel syndrome, left lower limb: Secondary | ICD-10-CM | POA: Diagnosis not present

## 2017-03-17 DIAGNOSIS — M7662 Achilles tendinitis, left leg: Secondary | ICD-10-CM | POA: Diagnosis not present

## 2017-03-26 DIAGNOSIS — F321 Major depressive disorder, single episode, moderate: Secondary | ICD-10-CM | POA: Diagnosis not present

## 2017-03-26 DIAGNOSIS — F411 Generalized anxiety disorder: Secondary | ICD-10-CM | POA: Diagnosis not present

## 2017-04-02 ENCOUNTER — Ambulatory Visit (INDEPENDENT_AMBULATORY_CARE_PROVIDER_SITE_OTHER): Payer: Medicare Other | Admitting: Family Medicine

## 2017-04-02 ENCOUNTER — Encounter: Payer: Self-pay | Admitting: Family Medicine

## 2017-04-02 VITALS — BP 138/90 | HR 86 | Temp 98.5°F | Wt 152.8 lb

## 2017-04-02 DIAGNOSIS — H6691 Otitis media, unspecified, right ear: Secondary | ICD-10-CM | POA: Diagnosis not present

## 2017-04-02 DIAGNOSIS — J069 Acute upper respiratory infection, unspecified: Secondary | ICD-10-CM

## 2017-04-02 MED ORDER — AMOXICILLIN 875 MG PO TABS: 875.0000 mg | ORAL_TABLET | Freq: Two times a day (BID) | ORAL | 0 refills | Status: DC

## 2017-04-02 NOTE — Progress Notes (Signed)
Chief Complaint  Patient presents with  . sore throat    sore throat, ear pain, cold. had this since friday    Subjective:  Stacy Decker is a 66 y.o. female who presents for a 5 day history of URI symptoms that started with a sore throat and has progressed with bilateral ear fullness, right ear pain, nasal congestion, and mild cough.   Denies fever, chills, body aches, rhinorrhea, sinus pain, chest pain, palpitations, shortness of breath, abdominal pain, N/V/D.  Treatment to date: Alka Seltzer cough and cold.  Denies sick contacts.  No other aggravating or relieving factors.  No other c/o.  ROS as in subjective.   Objective: Vitals:   04/02/17 0818  BP: 138/90  Pulse: 86  Temp: 98.5 F (36.9 C)  SpO2: 98%    General appearance: Alert, WD/WN, no distress, mildly ill appearing                             Skin: warm, no rash                           Head: no sinus tenderness                            Eyes: conjunctiva normal, corneas clear, PERRLA                            Ears: Right TM erythematous, retracted, no mastoid tenderness, Left TM is normal, external ear canals normal                          Nose: septum midline, turbinates swollen, with erythema and no discharge             Mouth/throat: MMM, tongue normal, mild pharyngeal erythema without edema or exudate                           Neck: supple, no adenopathy, no thyromegaly, nontender                          Heart: RRR, normal S1, S2, no murmurs                         Lungs: CTA bilaterally, no wheezes, rales, or rhonchi      Assessment: Acute otitis media, right - Plan: amoxicillin (AMOXIL) 875 MG tablet  Acute URI   Plan: Discussed diagnosis and treatment of acute otitis media. Amoxil prescribed.  Suggested symptomatic OTC remedies.Nasal saline spray for congestion.  Tylenol or Ibuprofen OTC for fever and malaise.  Call/return if worse or not back to baseline after completing the antibiotic.

## 2017-04-02 NOTE — Patient Instructions (Signed)
If you are not back to baseline when you complete the antibiotic then let me know.   If your cough gets worse, take Mucinex to help with this. Increase your water intake. Take Tylenol or ibuprofen as needed.

## 2017-04-08 ENCOUNTER — Telehealth: Payer: Self-pay | Admitting: Family Medicine

## 2017-04-08 NOTE — Telephone Encounter (Signed)
Pt was notified.  

## 2017-04-08 NOTE — Telephone Encounter (Signed)
Left message for pt to call me back 

## 2017-04-08 NOTE — Telephone Encounter (Signed)
This may just take more time. As long as she is not getting worse, have her continue with the current treatment and if not improving in the next 2-3 days then we can look at her again.

## 2017-04-08 NOTE — Telephone Encounter (Signed)
Pt states that she is still not able to hear, ears are still clogged. Along with antibiotic, she has been taking Mucinex and kids Sudafed.

## 2017-04-11 ENCOUNTER — Encounter: Payer: Self-pay | Admitting: Medical

## 2017-04-11 ENCOUNTER — Ambulatory Visit (INDEPENDENT_AMBULATORY_CARE_PROVIDER_SITE_OTHER): Payer: Medicare Other | Admitting: Medical

## 2017-04-11 ENCOUNTER — Telehealth: Payer: Self-pay | Admitting: Family Medicine

## 2017-04-11 VITALS — BP 124/70 | HR 84 | Temp 97.9°F | Wt 155.0 lb

## 2017-04-11 DIAGNOSIS — H6501 Acute serous otitis media, right ear: Secondary | ICD-10-CM

## 2017-04-11 DIAGNOSIS — H9313 Tinnitus, bilateral: Secondary | ICD-10-CM

## 2017-04-11 DIAGNOSIS — H938X3 Other specified disorders of ear, bilateral: Secondary | ICD-10-CM | POA: Diagnosis not present

## 2017-04-11 MED ORDER — PREDNISONE 20 MG PO TABS: ORAL_TABLET | ORAL | 0 refills | Status: DC

## 2017-04-11 MED ORDER — AMOXICILLIN 500 MG PO CAPS: 500.0000 mg | ORAL_CAPSULE | Freq: Three times a day (TID) | ORAL | 0 refills | Status: DC

## 2017-04-11 NOTE — Telephone Encounter (Signed)
Pt called and stated that she is almost done with medication and she continues to have issues with her ears. Her ears still feel full of fluid and they are "buzzing". Please advise pt. Pt uses [facility].

## 2017-04-11 NOTE — Telephone Encounter (Signed)
I think at this point we should take another look or possible have an ENT take a look.

## 2017-04-11 NOTE — Progress Notes (Signed)
Subjective: Chief Complaint  Patient presents with  . ear  are popping    ear popping , feel full   Saw Vickie NP here 04/02/17 for "severe" ear infection.  She is on last day of amoxicillin.  Doesn't feel but 50% improved.  Feels pressure in both ears that rotates from one ear to other.   Buzzes at times.  No pain though, not like last visit. No fever, no cough, no congestion otherwise, no sore throat, no NVD.  No other aggravating or relieving factors. No other complaint.  Past Medical History:  Diagnosis Date  . Anxiety   . Depression   . History of vertigo   . Osteoporosis   . Substance abuse ([place])    recovering alcoholic   Current Outpatient Medications on File Prior to Visit  Medication Sig Dispense Refill  . alendronate (FOSAMAX) 70 MG tablet TAKE 1 TABLET ONCE EVERY 7 DAYS, TAKE WITH A FULL GLASS OF WATER ON AN EMPTY STOMACH 12 tablet 0  . amoxicillin (AMOXIL) 875 MG tablet Take 1 tablet (875 mg total) 2 (two) times daily by mouth. 20 tablet 0  . aspirin EC 81 MG tablet Take 81 mg by mouth daily.    Marland Kitchen buPROPion (WELLBUTRIN XL) 150 MG 24 hr tablet Take 300 mg daily by mouth.     . Calcium Citrate-Vitamin D (CITRACAL/VITAMIN D PO) Take 1 tablet by mouth daily.    . citalopram (CELEXA) 20 MG tablet Take 20 mg daily by mouth.    . Glucos-Chond-Sterol-Fish Oil (GLUCOSAMINE CHONDROITIN PLUS PO) Take 2 tablets by mouth daily.    Marland Kitchen lamoTRIgine (LAMICTAL) 25 MG tablet TAKE 1 TABLET BY MOUTH DAILY FOR MOOD FOR 1 WEEK, THEN TAKE 2 TABLETS DAILY FOR MOOD  1  . Multiple Vitamin (MULTIVITAMIN) tablet Take 1 tablet by mouth daily.    . Naproxen Sodium (ALEVE PO) Take by mouth at bedtime.     . hydrOXYzine (ATARAX/VISTARIL) 10 MG tablet Take 1 tablet by mouth 2 (two) times daily.     Current Facility-Administered Medications on File Prior to Visit  Medication Dose Route Frequency Provider Last Rate Last Dose  . 0.9 %  sodium chloride infusion  500 mL Intravenous Continuous Milus Banister,  MD       ROS as in subjective    Objective: BP 124/70   Pulse 84   Temp 97.9 F (36.6 C)   Wt 155 lb (70.3 kg)   SpO2 96%   BMI 30.53 kg/m   General appearance: alert, no distress, WD/WN,  HEENT: normocephalic, sclerae anicteric, right TM flat, with 1/4 of posterior TM with erythema suggesting recent bleeding/bruising, left TM flat but not erythematous, nares patent, no discharge or erythema, pharynx normal Oral cavity: MMM, no lesions Neck: supple, no lymphadenopathy, no thyromegaly, no masses    Assessment: Encounter Diagnoses  Name Primary?  . Right acute serous otitis media, recurrence not specified Yes  . Pressure sensation in both ears   . Tinnitus of both ears     Plan: Advised medications below, increase water intake, give it a little more time for symptoms to resolve.   About 50% improved.   Call/recheck within a week  Cherysh was seen today for ear  are popping.  Diagnoses and all orders for this visit:  Right acute serous otitis media, recurrence not specified  Pressure sensation in both ears  Tinnitus of both ears  Other orders -     predniSONE (DELTASONE) 20 MG tablet; 3  tablets today, 2 tablets tomorrow, 1 tablet the third day -     amoxicillin (AMOXIL) 500 MG capsule; Take 1 capsule (500 mg total) 3 (three) times daily by mouth.

## 2017-04-11 NOTE — Telephone Encounter (Signed)
Pt is coming in to see shane today as vickie is booked up

## 2017-04-29 ENCOUNTER — Telehealth: Payer: Self-pay | Admitting: Family Medicine

## 2017-04-29 NOTE — Telephone Encounter (Signed)
Let's have her come in for a visit. Tell her to stop Afrin after 3 days, using it longer may cause worsening symptoms.

## 2017-04-29 NOTE — Telephone Encounter (Signed)
Pt called & states she has finished both rounds of antibiotics and ear infection got better, throat no better.  Pharmacist suggested Afrin also so she has used it for 2 days but throat still sore.  Wants to know if you want to look at it or what you recommend, back of roof of mouth hurts, more than throat, hurts when swallow.  Please let pt know

## 2017-04-30 ENCOUNTER — Encounter: Payer: Self-pay | Admitting: Family Medicine

## 2017-04-30 ENCOUNTER — Ambulatory Visit (INDEPENDENT_AMBULATORY_CARE_PROVIDER_SITE_OTHER): Payer: Medicare Other | Admitting: Family Medicine

## 2017-04-30 VITALS — BP 140/78 | HR 95 | Temp 98.8°F | Wt 151.8 lb

## 2017-04-30 DIAGNOSIS — H938X1 Other specified disorders of right ear: Secondary | ICD-10-CM

## 2017-04-30 DIAGNOSIS — J029 Acute pharyngitis, unspecified: Secondary | ICD-10-CM

## 2017-04-30 DIAGNOSIS — R059 Cough, unspecified: Secondary | ICD-10-CM

## 2017-04-30 DIAGNOSIS — R05 Cough: Secondary | ICD-10-CM | POA: Diagnosis not present

## 2017-04-30 LAB — POCT RAPID STREP A (OFFICE): Rapid Strep A Screen: NEGATIVE

## 2017-04-30 LAB — POCT MONO (EPSTEIN BARR VIRUS): Mono, POC: NEGATIVE

## 2017-04-30 NOTE — Progress Notes (Signed)
Chief Complaint  Patient presents with  . Acute Visit    sore throat, dry cough, started with ear infection, hurts when swallowing, worse at night    Subjective:  Stacy Decker is a 66 y.o. female who presents for sore throat, dry cough, right ear feeling clogged. States throat is most bothersome. Ongoing for weeks and not improving. States roof of her mouth hurts as well.  She took 2 rounds of antibiotics and had some relief but symptoms returned both times. Throat is worse.  Denies throat clearing or post nasal drainage. No history of allergies.  Denies reflux or dysphagia.    Denies fever, chills, nasal congestion, rhinorrhea, chest pain, shortness of breath, abdominal pain, N/V/D.   Treatment to date: antibiotics and Afrin short term.  Denies sick contacts.  No other aggravating or relieving factors.  No other c/o.  ROS as in subjective.   Objective: Vitals:   04/30/17 1553 04/30/17 1639  BP: (!) 146/82 140/78  Pulse: 95   Temp: 98.8 F (37.1 C)   SpO2: 97%     General appearance: Alert, WD/WN, no distress, mildly ill appearing                             Skin: warm, no rash                           Head: no sinus tenderness                            Eyes: conjunctiva normal, corneas clear, PERRLA                            Ears: TMs with scarring but able to visualize landmarks, external ear canals normal                          Nose: septum midline, turbinates swollen, with erythema L>R, no discharge             Mouth/throat: MMM, tongue normal, mild pharyngeal erythema                           Neck: supple, no adenopathy, no thyromegaly, nontender                          Heart: RRR, normal S1, S2, no murmurs                         Lungs: CTA bilaterally, no wheezes, rales, or rhonchi      Assessment: Pharyngitis, unspecified etiology - Plan: POCT Mono (Epstein Barr Virus), POCT rapid strep A, Ambulatory referral to ENT  Sensation of fullness in right ear - Plan:  Ambulatory referral to ENT  Cough    Plan: Discussed diagnosis and treatment of persistent pharyngitis and ear fullness. She has had 2 rounds of antibiotics without resolution of symptoms. Plan to send her to ENT for further evaluation.  Suggested symptomatic OTC remedies.  She may try a PPI in case reflux is playing a role.

## 2017-04-30 NOTE — Patient Instructions (Signed)
You are negative for strep and mono.   Do salt water gargles and stay well hydrated.   I am referring you to an ENT for further evaluation.

## 2017-04-30 NOTE — Telephone Encounter (Signed)
Left message pt to call.

## 2017-05-01 ENCOUNTER — Other Ambulatory Visit: Payer: Self-pay

## 2017-05-07 DIAGNOSIS — M25572 Pain in left ankle and joints of left foot: Secondary | ICD-10-CM | POA: Diagnosis not present

## 2017-05-07 DIAGNOSIS — M2142 Flat foot [pes planus] (acquired), left foot: Secondary | ICD-10-CM | POA: Diagnosis not present

## 2017-05-23 ENCOUNTER — Ambulatory Visit: Payer: Medicare Other | Admitting: Family Medicine

## 2017-05-28 NOTE — Progress Notes (Signed)
Stacy Decker is a 67 y.o. female who presents for annual wellness visit and follow-up on chronic medical conditions.  She has the following concerns:  History of elevated Hgb A1c and prediabetes.   States she has been working more and volunteering. Has been seeing her therapist regularly.  She just celebrated 25 year anniversary of sobriety.  Going through a separation and upcoming divorce and states she is doing much better with this.  Denies feeling depressed or anxious.  Reports having some mild fatigue but she thinks this is associated with staying up late reading and not sleeping as much as she should be.  She had the old shingles vaccine in 2013.    Immunization History  Administered Date(s) Administered  . Influenza, High Dose Seasonal PF 01/30/2016  . Influenza-Unspecified 03/10/2012, 02/12/2013, 03/11/2015, 02/28/2017  . Pneumococcal Conjugate-13 10/11/2014  . Pneumococcal Polysaccharide-23 02/19/2016  . Tdap 09/10/2013  . Zoster 03/10/2012   Last Pap smear: does not remember Last mammogram: 03/14/2017 Last colonoscopy: July 29, 2016 Last DEXA: 03/05/2016 Dentist: yes, 3-4 months ago. Ophtho: yes, a few weeks ago Exercise: not really, will start PT on ankle  Other doctors caring for patient include: Podiatry- Dr. Paulla Dolly Orthopedist- Dr. Doran Durand at [place]- Dr. Olin Hauser  Psychiatrist- Dr. Darleene Cleaver  but sees NP Crystal at [facility].     Depression screen:  See questionnaire below.  Depression screen [facility] 2/9 05/29/2017 02/19/2016  Decreased Interest 0 1  Down, Depressed, Hopeless 0 0  PHQ - 2 Score 0 1    Fall Risk Screen: see questionnaire below. Fall Risk  05/29/2017 05/01/2017 02/19/2016  Falls in the past year? Yes Yes No  Comment - Emmi Telephone Survey: data to providers prior to load -  Number falls in past yr: 1 2 or more -  Comment - Emmi Telephone Survey Actual Response = 2 -  Injury with Fall? No No -  Risk for fall  due to : Other (Comment) - -  Risk for fall due to: Comment tripped - -  Follow up Falls evaluation completed;Falls prevention discussed - -    ADL screen:  See questionnaire below Functional Status Survey: Is the patient deaf or have difficulty hearing?: No Does the patient have difficulty seeing, even when wearing glasses/contacts?: Yes(glasses) Does the patient have difficulty concentrating, remembering, or making decisions?: No Does the patient have difficulty walking or climbing stairs?: No Does the patient have difficulty dressing or bathing?: No Does the patient have difficulty doing errands alone such as visiting a doctor's office or shopping?: No   End of Life Discussion:  Patient has a living will and medical power of attorney  Review of Systems Constitutional: -fever, -chills, -sweats, -unexpected weight change, -anorexia, + mild fatigue Allergy: -sneezing, -itching, -congestion Dermatology: denies changing moles, rash, lumps, new worrisome lesions ENT: -runny nose, -ear pain, -sore throat, -hoarseness, -sinus pain, -teeth pain, -tinnitus, -hearing loss, -epistaxis Cardiology:  -chest pain, -palpitations, -edema, -orthopnea, -paroxysmal nocturnal dyspnea Respiratory: -cough, -shortness of breath, -dyspnea on exertion, -wheezing, -hemoptysis Gastroenterology: -abdominal pain, -nausea, -vomiting, -diarrhea, -constipation, -blood in stool, -changes in bowel movement, -dysphagia Hematology: -bleeding or bruising problems Musculoskeletal: -arthralgias, -myalgias, -joint swelling, -back pain, -neck pain, -cramping, -gait changes Ophthalmology: -vision changes, -eye redness, -itching, -discharge Urology: -dysuria, -difficulty urinating, -hematuria, -urinary frequency, -urgency, incontinence Neurology: -headache, -weakness, -tingling, -numbness, -speech abnormality, -memory loss, +falls x 1 month ago, -dizziness Psychology:  -depressed mood, -agitation, -sleep  problems    PHYSICAL  EXAM:  BP 122/80 (BP Location: Right Arm, Patient Position: Sitting)   Pulse 76   Ht 5\' 1"  (1.549 m)   Wt 154 lb 3.2 oz (69.9 kg)   SpO2 96%   BMI 29.14 kg/m   General Appearance: Alert, cooperative, no distress, appears stated age Head: Normocephalic, without obvious abnormality, atraumatic Eyes: PERRL, conjunctiva/corneas clear, EOM's intact, fundi benign Ears: Normal TM's and external ear canals Nose: Nares normal, mucosa normal, no drainage or sinus   tenderness Throat: Lips, mucosa, and tongue normal; teeth and gums normal Neck: Supple, no lymphadenopathy; thyroid: no enlargement/tenderness/nodules; no carotid bruit or JVD Back: Spine nontender, no curvature, ROM normal, no CVA tenderness Lungs: Clear to auscultation bilaterally without wheezes, rales or ronchi; respirations unlabored Chest Wall: No tenderness or deformity Heart: Regular rate and rhythm, S1 and S2 normal, no murmur, rub or gallop Breast Exam: No tenderness, masses, or nipple discharge or inversion. No axillary lymphadenopathy Abdomen: Soft, non-tender, nondistended, normoactive bowel sounds, no masses, no hepatosplenomegaly Genitalia: Normal external genitalia without lesions.  BUS and vagina normal; cervix without lesions, or cervical motion tenderness. No abnormal vaginal discharge.  Uterus and adnexa not enlarged, nontender, no masses.  Pap performed Rectal: Normal tone, no masses or tenderness; guaiac negative stool Extremities: No clubbing, cyanosis or edema Pulses: 2+ and symmetric all extremities Skin: Skin color, texture, turgor normal, no rashes or lesions Lymph nodes: Cervical, supraclavicular, and axillary nodes normal Neurologic: CNII-XII intact, normal strength, sensation and gait; reflexes 2+ and symmetric throughout Psych: Normal mood, affect, hygiene and grooming.  ASSESSMENT/PLAN: Medicare annual wellness visit, subsequent  Osteoporosis without current pathological  fracture, unspecified osteoporosis type - Plan: alendronate (FOSAMAX) 70 MG tablet  Screening for cervical cancer - Plan: Cytology - PAP  Prediabetes - Plan: Hemoglobin A1c, TSH  Anxiety and depression - Plan: TSH  Medication management - Plan: VITAMIN D 25 Hydroxy (Vit-D Deficiency, Fractures)  Fatigue, unspecified type - Plan: Comprehensive metabolic panel, TSH, CBC with Differential/Platelet, VITAMIN D 25 Hydroxy (Vit-D Deficiency, Fractures)  She appears to be doing well emotionally and physically.  She will continue seeing her therapist and continue on current medications.  No obvious side effects of medications. She does report some mild fatigue but thinks this is related to lack of sleep.  We will check labs to look for underlying etiology.  She is currently taking vitamin D for vitamin D deficiency and osteoporosis.  We will check this level today. She is doing well on alendronate once weekly and appears to be taking this correctly.  Refill given. Strongly encouraged her to start exercising, particularly doing weightbearing exercises. Will check hemoglobin A1c due to prediabetes. Up-to-date on immunizations however she did have the old shingles vaccine.  She will check with her insurance company and decide if she would like the new Shingrix and if so she will let me know. She has advanced directives however this was done when she and her husband were together.  She will get these changed and updated. Fell walking her dog and stubbed her dog.  Did not have chest pain, dizziness and does not feel unsteady.  She is not high fall risk. Follow-up pending labs. She will be due for repeat DEXA scan in late October early November 2019  Discussed monthly self breast exams and yearly mammograms; at least 30 minutes of aerobic activity at least 5 days/week and weight-bearing exercise 2x/week; proper sunscreen use reviewed; healthy diet, including goals of calcium and vitamin D intake and alcohol  recommendations (  less than or equal to 1 drink/day) reviewed; regular seatbelt use; changing batteries in smoke detectors.  Immunization recommendations discussed.  Colonoscopy recommendations reviewed   Medicare Attestation I have personally reviewed: The patient's medical and social history Their use of alcohol, tobacco or illicit drugs Their current medications and supplements The patient's functional ability including ADLs,fall risks, home safety risks, cognitive, and hearing and visual impairment Diet and physical activities Evidence for depression or mood disorders  The patient's weight, height, and BMI have been recorded in the chart.  I have made referrals, counseling, and provided education to the patient based on review of the above and I have provided the patient with a written personalized care plan for preventive services.     Harland Dingwall, NP-C   05/29/2017

## 2017-05-29 ENCOUNTER — Encounter: Payer: Self-pay | Admitting: Family Medicine

## 2017-05-29 ENCOUNTER — Ambulatory Visit (INDEPENDENT_AMBULATORY_CARE_PROVIDER_SITE_OTHER): Payer: Medicare Other | Admitting: Family Medicine

## 2017-05-29 ENCOUNTER — Other Ambulatory Visit (HOSPITAL_COMMUNITY)
Admission: RE | Admit: 2017-05-29 | Discharge: 2017-05-29 | Disposition: A | Discharge status: Home / Self Care | Payer: Medicare Other | Source: Ambulatory Visit | Attending: Family Medicine | Admitting: Family Medicine

## 2017-05-29 VITALS — BP 122/80 | HR 76 | Ht 61.0 in | Wt 154.2 lb

## 2017-05-29 DIAGNOSIS — Z124 Encounter for screening for malignant neoplasm of cervix: Secondary | ICD-10-CM | POA: Insufficient documentation

## 2017-05-29 DIAGNOSIS — F419 Anxiety disorder, unspecified: Secondary | ICD-10-CM | POA: Diagnosis not present

## 2017-05-29 DIAGNOSIS — R5383 Other fatigue: Secondary | ICD-10-CM

## 2017-05-29 DIAGNOSIS — R7303 Prediabetes: Secondary | ICD-10-CM | POA: Insufficient documentation

## 2017-05-29 DIAGNOSIS — F329 Major depressive disorder, single episode, unspecified: Secondary | ICD-10-CM | POA: Diagnosis not present

## 2017-05-29 DIAGNOSIS — Z299 Encounter for prophylactic measures, unspecified: Secondary | ICD-10-CM | POA: Insufficient documentation

## 2017-05-29 DIAGNOSIS — M810 Age-related osteoporosis without current pathological fracture: Secondary | ICD-10-CM | POA: Diagnosis not present

## 2017-05-29 DIAGNOSIS — Z0000 Encounter for general adult medical examination without abnormal findings: Secondary | ICD-10-CM | POA: Diagnosis not present

## 2017-05-29 DIAGNOSIS — Z79899 Other long term (current) drug therapy: Secondary | ICD-10-CM | POA: Diagnosis not present

## 2017-05-29 DIAGNOSIS — F32A Depression, unspecified: Secondary | ICD-10-CM

## 2017-05-29 MED ORDER — ALENDRONATE SODIUM 70 MG PO TABS: ORAL_TABLET | ORAL | 1 refills | Status: DC

## 2017-05-29 NOTE — Patient Instructions (Addendum)
Call your insurance company and find out about the Shingrix vaccine coverage. Let me know if you decide to get this. You will need to have this at a local pharmacy for Medicare to cover it.   MEDICARE PREVENTATIVE SERVICES (FEMALE) AND PERSONALIZED PLAN for [facility] May 29, 2017  CONDITIONS OR RISKS IDENTIFIED TODAY: Osteoporosis   SPECIFIC RECOMMENDATIONS: Continue taking once weekly alendronate.  Continue taking a vitamin D supplement. Check the dose of calcium you are taking and make sure you are getting 1,200 mg of calcium in your diet and with supplement. You do not need a lot more than this.  Start getting weight bearing exercises.   Influenza vaccine: 02/2017 Pneumococcal vaccine: up to date with both  Shingles vaccine: Zostavax 2013 Tdap vaccine: 08/2013 Colonoscopy: 07/2016  Mammogram: due after 03/14/18 Pelvic exam: done today Pap smear: done today   Return in 6 months.    GENERAL RECOMMENDATIONS FOR GOOD HEALTH:  Supplements:  . Take a daily baby Aspirin 81mg  at bedtime for heart health unless you have a history of gastrointestinal bleed, allergy to aspirin, or are already taking higher dose Aspirin or other antiplatelet or blood thinner medication.   . Consume 1200 mg of Calcium daily through dietary calcium or supplement if you are female age 68 or older, or men 69 and older.   Men aged 76-70 should consume 1000 mg of Calcium daily. . Take 600 IU of Vitamin D daily.  Take 800 IU of Calcium daily if you are older than age 26.  . Take a general multivitamin daily.   Healthy diet: Eat a variety of foods, including fruits, vegetables, vegetable protein such as beans, lentils, tofu, and grains, such as rice.  Limit meat or animal protein, but if you eat meat, choose leans cuts such as chicken, fish, or [place].  Drink plenty of water daily.  Decrease saturated fat in the diet, avoid lots of red meat, processed foods, sweets, fast foods, and fried foods.  Limit salt and  caffeine intake.  Exercise: Aerobic exercise helps maintain good heart health. Weight bearing exercise helps keep bones and muscles working strong.  We recommend at least 30-40 minutes of exercise most days of the week.   Fall prevention: Falls are the leading cause of injuries, accidents, and accidental deaths in people over the age of 38. Falling is a real threat to your ability to live on your own.  Causes include poor eyesight or poor hearing, illness, poor lighting, throw rugs, clutter in your home, and medication side effects causing dizziness or balance problems.  Such medications can include medications for depression, sleep problems, high blood pressure, diabetes, and heart conditions.   PREVENTION  Be sure your home is as safe as possible. Here are some tips:  Wear shoes with non-skid soles (not house slippers).   Be sure your home and outside area are well lit.   Use night lights throughout your house, including hallways and stairways.   Remove clutter and clean up spills on floors and walkways.   Remove throw rugs or fasten them to the floor with carpet tape. Tack down carpet edges.   Do not place electrical cords across pathways.   Install grab bars in your bathtub, shower, and toilet area. Towel bars should not be used as a grab bar.   Install handrails on both sides of stairways.   Do not climb on stools or stepladders. Get someone else to help with jobs that require climbing.   Do  not wax your floors at all, or use a non-skid wax.   Repair uneven or unsafe sidewalks, walkways or stairs.   Keep frequently used items within reach.   Be aware of pets so you do not trip.  Get regular check-ups from your doctor, and take good care of yourself:  Have your eyes checked every year for vision changes, cataracts, glaucoma, and other eye problems. Wear eyeglasses as directed.   Have your hearing checked every 2 years, or anytime you or others think that you cannot hear  well. Use hearing aids as directed.   See your caregiver if you have foot pain or corns. Sore feet can contribute to falls.   Let your caregiver know if a medicine is making you feel dizzy or making you lose your balance.   Use a cane, walker, or wheelchair as directed. Use walker or wheelchair brakes when getting in and out.   When you get up from bed, sit on the side of the bed for 1 to 2 minutes before you stand up. This will give your blood pressure time to adjust, and you will feel less dizzy.   If you need to go to the bathroom often, consider using a bedside commode.  Disease prevention:  If you smoke or chew tobacco, find out from your caregiver how to quit. It can literally save your life, no matter how long you have been a tobacco user. If you do not use tobacco, never begin. Medicare does cover some smoking cessation counseling.  Maintain a healthy diet and normal weight. Increased weight leads to problems with blood pressure and diabetes. We check your height, weight, and BMI as part of your yearly visit.  The Body Mass Index or BMI is a way of measuring how much of your body is fat. Having a BMI above 27 increases the risk of heart disease, diabetes, hypertension, stroke and other problems related to obesity. Your caregiver can help determine your BMI and based on it develop an exercise and dietary program to help you achieve or maintain this important measurement at a healthful level.  High blood pressure causes heart and blood vessel problems.  Persistent high blood pressure should be treated with medicine if weight loss and exercise do not work.  We check your blood pressure as part of your yearly visit.  Avoid drinking alcohol in excess (more than two drinks per day).  Avoid use of street drugs. Do not share needles with anyone. Ask for professional help if you need assistance or instructions on stopping the use of alcohol, cigarettes, and/or drugs.  Brush your teeth twice a  day with fluoride toothpaste, and floss once a day. Good oral hygiene prevents tooth decay and gum disease. The problems can be painful, unattractive, and can cause other health problems. Visit your dentist for a routine oral and dental checkup and preventive care every 6-12 months.   See your eye doctor yearly for routine screening for things like glaucoma.  Look at your skin regularly.  Use a mirror to look at your back. Notify your caregivers of changes in moles, especially if there are changes in shapes, colors, a size larger than a pencil eraser, an irregular border, or development of new moles.  Safety:  Use seatbelts 100% of the time, whether driving or as a passenger.  Use safety devices such as hearing protection if you work in environments with loud noise or significant background noise.  Use safety glasses when doing any work that  could send debris in to the eyes.  Use a helmet if you ride a bike or motorcycle.  Use appropriate safety gear for contact sports.  Talk to your caregiver about gun safety.  Use sunscreen with a SPF (or skin protection factor) of 15 or greater.  Lighter skinned people are at a greater risk of skin cancer. Don't forget to also wear sunglasses in order to protect your eyes from too much damaging sunlight. Damaging sunlight can accelerate cataract formation.   If you have multiple sexual partners, or if you are not in a monogamous relationship, practice safe sex. Use condoms. Condoms are used to help reduce the spread of sexually transmitted infections (or STIs).  Consider an HIV test if you have never been tested.  Consider routine screening for STIs if you have multiple sexual partners.   Keep carbon monoxide and smoke detectors in your home functioning at all times. Change the batteries every 6 months or use a model that plugs into the wall or is hard wired in.   END OF LIFE PLANNING/ADVANCED DIRECTIVES Advance health-care planning is deciding the kind of care  you want at the end of life. While alert competent adults are able to exercise their rights to make health care and financial decisions, problems arise when an individual becomes unconscious, incapacitated, or otherwise unable to communicate or make such decisions. Advance health care directives are the legal documents in which you give written instructions about your choices limited, aggressive or palliative care if, in the future, you cannot speak for yourself.  Advanced directives include the following: [place] allows you to appoint someone to act as your health care agent to make health care decisions for you should it be determined by your health care provider that you are no longer able to make these decisions for yourself.  A Living Will is a legal document in which you can declare that under certain conditions you desire your life not be prolonged by extraordinary or artificial means during your last illness or when you are near death. We can provide you with sample advanced directives, you can get an attorney to prepare these for you, or you can visit [place] Secretary of State's website for additional information and resources at [email or web address]  Further, I recommend you have an attorney prepare a Will and Durable Power of Attorney if you haven't done so already.  Please get [place] a copy of your health care Advanced Directives.   PREVENTATIV E CARE RECOMMENDATIONS:  Vaccinations: We recommend the following vaccinations as part of your preventative care:  Pneumococcal vaccine is recommended to protect against certain types of pneumonia.  This is normally recommended for adults age 62 or older once, or up to every 5 years for those at high risk.  The vaccine is also recommended for adults younger than 68 years old with certain underlying conditions that make them high risk for pneumonia.  Influenza vaccine is recommended to protect against seasonal  influenza or "the flu." Influenza is a serious disease that can lead to hospitalization and sometimes even death. Traditional flu vaccines (called trivalent vaccines) are made to protect against three flu viruses; an influenza A (H1N1) virus, an influenza A (H3N2) virus, and an influenza B virus. In addition, there are flu vaccines made to protect against four flu viruses (called "quadrivalent" vaccines). These vaccines protect against the same viruses as the trivalent vaccine and an additional B virus.  We recommend the high dose influenza vaccine  to those 17 years and older.  Hepatitis B vaccine to protect against a form of infection of the liver by a virus acquired from blood or body fluids, particularly for high risk groups.  Td or Tdap vaccine to protect against Tetanus, diphtheria and pertussis which can be very serious.  These diseases are caused by bacteria.  Diphtheria and pertussis are spread from person to person through coughing or sneezing.  Tetanus enters the body through cuts, scratches, or wounds.  Tetanus (Lockjaw) causes painful muscle tightening and stiffness, usually all over the body.  Diphtheria can cause a thick coating to form in the back of the throat.  It can lead to breathing problems, paralysis, heart failure, and death.  Pertussis (Whooping Cough) causes severe coughing spells, which can cause difficulty breathing, vomiting and disturbed sleep.  Td or Tdap is usually given every 10 years.  Shingles vaccine to protect against Varicella Zoster if you are older than age 68, or younger than 68 years old with certain underlying illness.    Cancer Screening: Most routine colon cancer screening begins at the age of 39.  Subsequent colonoscopies are performed either every 5-10 years for normal screening, or every 2-5 years for higher risks patients, up until age 38 years of age. Annual screening is done with easy to use take-home tests to check for hidden blood in the stool called  hemoccult tests.  Sigmoidoscopy or colonoscopy can detect the earliest forms of colon cancer and is life saving. These tests use a small camera at the end of a tube to directly examine the colon.   Pelvic Exam and Pap Smear: Pelvic exams and pap smears are performed routinely to evaluate for abnormalities as well as cancers including cervical and vaginal cancers.  This is generally performed every 2-3 years for most women, or more frequently for higher risk patients.  Mammograms: Mammograms are used to screen for breast cancer.  Medicare covers baseline screening once from ages 20-55 years old, but will cover mammograms yearly for those 40 years and older.  In accordance with other guidelines, you may not need a mammogram every year though.  The decision on how frequently you need a mammogram should be discussed with you medical provider.    Osteoporosis Screening: Screening for osteoporosis usually begins at age 25 for women, and can be done as frequent as every 2 years.  However, women or men with higher risk of osteoporosis may be screened earlier than age 54.  Osteoporosis or low bone mass is diminished bone strength from alterations in bone architecture leading to bone fragility and increased fracture risk.     Cardiovascular Screening: Fat and cholesterol leaves deposits in your arteries that can block them. This causes heart disease and vessel disease elsewhere in your body.  If your cholesterol is found to be high, or if you have heart disease or certain other medical conditions, then you may need to have your cholesterol monitored frequently and be treated with medication. Cardiovascular screening in the form of lab tests for cholesterol, HDL and triglycerides can be done every 5 years.  A screening electrocardiogram can be done as part of the Welcome to Medicare physical.  Diabetes Screening: Diabetes screening can be done at least every 3 years for those with risk factors,  or every  6-23months for prediabetic patients.  Screening includes fasting blood sugar test or glucose tolerance test.  Risk factors include hypertension, dyslipidemia, obesity, previously abnormal glucose tests, family history of diabetes, age 38  years or older, and history of gestations diabetes.   AAA (abdominal aortic aneurysm) Screening: Medicare allows for a one time ultrasound to screen for abdominal aortic aneurysm if done as a referral as part of the Welcome to Medicare exam.  Men eligible for this screening include those men between age 82-3 years of age who have smoked at least 100 cigarettes in his lifetime and/or has a family history of AAA.  HIV Screening:  Medicare allows for yearly screening for patients at high risk for contracting HIV disease.

## 2017-05-30 LAB — CBC WITH DIFFERENTIAL/PLATELET
BASOS PCT: 1.1 %
Basophils Absolute: 89 cells/uL (ref 0–200)
EOS ABS: 599 {cells}/uL — AB (ref 15–500)
EOS PCT: 7.4 %
HCT: 38.9 % (ref 35.0–45.0)
Hemoglobin: 13.3 g/dL (ref 11.7–15.5)
Lymphs Abs: 1215 cells/uL (ref 850–3900)
MCH: 30.3 pg (ref 27.0–33.0)
MCHC: 34.2 g/dL (ref 32.0–36.0)
MCV: 88.6 fL (ref 80.0–100.0)
MONOS PCT: 10.7 %
MPV: 10.1 fL (ref 7.5–12.5)
NEUTROS ABS: 5330 {cells}/uL (ref 1500–7800)
Neutrophils Relative %: 65.8 %
PLATELETS: 286 10*3/uL (ref 140–400)
RBC: 4.39 10*6/uL (ref 3.80–5.10)
RDW: 12.6 % (ref 11.0–15.0)
TOTAL LYMPHOCYTE: 15 %
WBC mixed population: 867 cells/uL (ref 200–950)
WBC: 8.1 10*3/uL (ref 3.8–10.8)

## 2017-05-30 LAB — COMPREHENSIVE METABOLIC PANEL
AG RATIO: 2.2 (calc) (ref 1.0–2.5)
ALKALINE PHOSPHATASE (APISO): 84 U/L (ref 33–130)
ALT: 12 U/L (ref 6–29)
AST: 21 U/L (ref 10–35)
Albumin: 3.9 g/dL (ref 3.6–5.1)
BILIRUBIN TOTAL: 0.2 mg/dL (ref 0.2–1.2)
BUN: 15 mg/dL (ref 7–25)
CALCIUM: 8.9 mg/dL (ref 8.6–10.4)
CHLORIDE: 105 mmol/L (ref 98–110)
CO2: 28 mmol/L (ref 20–32)
Creat: 0.7 mg/dL (ref 0.50–0.99)
GLUCOSE: 96 mg/dL (ref 65–99)
Globulin: 1.8 g/dL (calc) — ABNORMAL LOW (ref 1.9–3.7)
Potassium: 4.5 mmol/L (ref 3.5–5.3)
Sodium: 139 mmol/L (ref 135–146)
Total Protein: 5.7 g/dL — ABNORMAL LOW (ref 6.1–8.1)

## 2017-05-30 LAB — HEMOGLOBIN A1C
HEMOGLOBIN A1C: 5.5 %{Hb} (ref <5.7)
MEAN PLASMA GLUCOSE: 111 (calc)
eAG (mmol/L): 6.2 (calc)

## 2017-05-30 LAB — TSH: TSH: 1.74 m[IU]/L (ref 0.40–4.50)

## 2017-05-30 LAB — VITAMIN D 25 HYDROXY (VIT D DEFICIENCY, FRACTURES): VIT D 25 HYDROXY: 63 ng/mL (ref 30–100)

## 2017-06-04 ENCOUNTER — Telehealth: Payer: Self-pay

## 2017-06-04 LAB — CYTOLOGY - PAP
Diagnosis: NEGATIVE
HPV (WINDOPATH): NOT DETECTED

## 2017-06-04 NOTE — Telephone Encounter (Signed)
Called patient and gave lab results. Patient had no questions or concerns.  

## 2017-06-04 NOTE — Telephone Encounter (Signed)
Called patient and advised of lab work, she would like you to know that she has stopped taking the calcium and vitamin d mix, and has started taking just the vitamin d 500 unit.   No questions on lab work.

## 2017-06-04 NOTE — Telephone Encounter (Signed)
-----   Message from Girtha Rm, NP-C sent at 06/04/2017  8:10 AM EST ----- Her blood work is fine except her protein is a little low. I would encourage her to make sure she is eating a well rounded diet with healthy protein. Her vitamin D is normal. Continue on her current dosing. No sign of diabetes or even pre diabetes at this point. I am still waiting on her pap smear result. Please check on this. Thanks.

## 2017-06-06 DIAGNOSIS — F321 Major depressive disorder, single episode, moderate: Secondary | ICD-10-CM | POA: Diagnosis not present

## 2017-06-06 DIAGNOSIS — F411 Generalized anxiety disorder: Secondary | ICD-10-CM | POA: Diagnosis not present

## 2017-07-10 DIAGNOSIS — M2142 Flat foot [pes planus] (acquired), left foot: Secondary | ICD-10-CM

## 2017-07-10 HISTORY — DX: Flat foot (pes planus) (acquired), left foot: M21.42

## 2017-07-17 DIAGNOSIS — M2142 Flat foot [pes planus] (acquired), left foot: Secondary | ICD-10-CM | POA: Diagnosis not present

## 2017-07-24 DIAGNOSIS — M2142 Flat foot [pes planus] (acquired), left foot: Secondary | ICD-10-CM | POA: Diagnosis not present

## 2017-07-31 DIAGNOSIS — M2142 Flat foot [pes planus] (acquired), left foot: Secondary | ICD-10-CM | POA: Diagnosis not present

## 2017-08-07 DIAGNOSIS — M2142 Flat foot [pes planus] (acquired), left foot: Secondary | ICD-10-CM | POA: Diagnosis not present

## 2017-08-14 DIAGNOSIS — M2142 Flat foot [pes planus] (acquired), left foot: Secondary | ICD-10-CM | POA: Diagnosis not present

## 2017-08-21 DIAGNOSIS — M2142 Flat foot [pes planus] (acquired), left foot: Secondary | ICD-10-CM | POA: Diagnosis not present

## 2017-08-28 ENCOUNTER — Encounter: Payer: Self-pay | Admitting: Family Medicine

## 2017-08-28 ENCOUNTER — Ambulatory Visit (INDEPENDENT_AMBULATORY_CARE_PROVIDER_SITE_OTHER): Payer: Medicare Other | Admitting: Family Medicine

## 2017-08-28 VITALS — BP 132/84 | HR 80 | Ht 61.0 in | Wt 157.0 lb

## 2017-08-28 DIAGNOSIS — R0789 Other chest pain: Secondary | ICD-10-CM

## 2017-08-28 NOTE — Patient Instructions (Signed)
I do not think you fractured any ribs. You implants seem fine The area where you have pain is between the ribs, and near the area where the cartilage connects the rib to the breastbone (sternum). I recommend trial of ice vs heat (alternating if they both feel good, otherwise just using whichever feels better). You can increase the Aleve to taking it twice daily with food for up to a week, to see if this helps with the pain (and with your hip pain). Discuss your right hip pain with your therapist--it is likely being caused by some of the ankle exercises, and they can show you some additional stretches for this.  Lastly, if you ultimately end up seeing a change in your left breast/implant, let us know, and we can arrange for imaging.

## 2017-08-28 NOTE — Progress Notes (Signed)
Chief Complaint  Patient presents with  . Fall    fell at home 08/21/17, outside while walking dog. Left side is hurting her and she thinks she may have broken her rib. Also afraid that her breast implant (silicon) may have deflated as well. Right hip pain as well.    3/28 she fell while walking her dog (tripped on curb), fell onto her left chest wall, scraped her knee. She heard a "pop" on the left side of her chest.  She is worried that it could have been her silicone implant that popped, or a rib breaking.  She had a lot of pain at the left chest yesterday with coughing, laughing.  Felt more painful yesterday and over the weekend then it did initially after the injury/fall.  No head injury or LOC. Doesn't see any bruising. Hasn't noticed any change in size of left implant/breast.   She has been having some right sided hip pain (preceding her fall).  She thinks it might be related to the bands she is using/exercises she is doing at PT for her ankle.   PMH, PSH, SH reviewed.  She reports not taking ASA 81mg  for the last few days, since she started taking Aleve. Taking Aleve just once daily at night for the last few days. Outpatient Encounter Medications as of 08/28/2017  Medication Sig  . alendronate (FOSAMAX) 70 MG tablet TAKE 1 TABLET ONCE EVERY 7 DAYS, TAKE WITH A FULL GLASS OF WATER ON AN EMPTY STOMACH  . aspirin EC 81 MG tablet Take 81 mg by mouth daily.  Marland Kitchen buPROPion (WELLBUTRIN XL) 150 MG 24 hr tablet Take 300 mg daily by mouth.   . cholecalciferol (VITAMIN D) 1000 units tablet Take 1,000 Units by mouth daily.  . citalopram (CELEXA) 20 MG tablet Take 20 mg daily by mouth.  . Glucos-Chond-Sterol-Fish Oil (GLUCOSAMINE CHONDROITIN PLUS PO) Take 2 tablets by mouth daily.  . hydrOXYzine (ATARAX/VISTARIL) 10 MG tablet Take 1 tablet by mouth as needed.   . lamoTRIgine (LAMICTAL) 25 MG tablet TAKE 1 TABLET BY MOUTH DAILY FOR MOOD FOR 1 WEEK, THEN TAKE 2 TABLETS DAILY FOR MOOD  . Multiple  Vitamin (MULTIVITAMIN) tablet Take 1 tablet by mouth daily.  . [DISCONTINUED] Calcium Citrate-Vitamin D (CITRACAL/VITAMIN D PO) Take 1 tablet by mouth daily.  . Naproxen Sodium (ALEVE PO) Take by mouth at bedtime.    Facility-Administered Encounter Medications as of 08/28/2017  Medication  . 0.9 %  sodium chloride infusion   No Known Allergies  ROS: no fever, chills, shortness of breath, bruising, bleeding, rash.  +left sided chest wall pain, right hip pain, per HPI. No headaches, dizziness or other concerns.   PHYSICAL EXAM:  BP 132/84   Pulse 80   Ht 5\' 1"  (1.549 m)   Wt 157 lb (71.2 kg)   BMI 29.66 kg/m   Well appearing, pleasant female in no distress HEENT: conjunctiva and sclera are clear, EOMI Neck and spine: nontender Chest: breasts appear normal, symmetric, no masses, adenopathy.  No evidence of deflation of implant or any surrounding inflammation.  There is no bruising or soft tissue swelling. Area of discomfort it just medial to the costochondral junction along the inferior portion of her breast.  She did NOT have any pain with deep breath, but had discomfort in this area when she coughed.  Lungs: clear bilaterally with good air movement throughout. Abdomen nontender, no mass Extremities: Area of right hip discomfort is at R ASIS. Full exam not performed  ASSESSMENT/PLAN:  Chest wall pain - contusion vs strain of intercostal muscles or at CC. Reassured re: implants, rib fx not likely   rec heat vs ice. Can continue NSAID Shown some stretches for R hip (hip flexors, ITB), to discuss with therapist

## 2017-09-03 DIAGNOSIS — F411 Generalized anxiety disorder: Secondary | ICD-10-CM | POA: Diagnosis not present

## 2017-09-03 DIAGNOSIS — F321 Major depressive disorder, single episode, moderate: Secondary | ICD-10-CM | POA: Diagnosis not present

## 2017-11-05 ENCOUNTER — Other Ambulatory Visit: Payer: Self-pay | Admitting: Family Medicine

## 2017-11-05 DIAGNOSIS — M810 Age-related osteoporosis without current pathological fracture: Secondary | ICD-10-CM

## 2017-11-05 NOTE — Telephone Encounter (Signed)
ok 

## 2017-11-05 NOTE — Telephone Encounter (Signed)
Is this ok to refill?  

## 2017-12-02 DIAGNOSIS — F321 Major depressive disorder, single episode, moderate: Secondary | ICD-10-CM | POA: Diagnosis not present

## 2017-12-02 DIAGNOSIS — F411 Generalized anxiety disorder: Secondary | ICD-10-CM | POA: Diagnosis not present

## 2017-12-10 ENCOUNTER — Telehealth: Payer: Self-pay | Admitting: Internal Medicine

## 2017-12-10 NOTE — Telephone Encounter (Signed)
Pt was notified.  

## 2017-12-10 NOTE — Telephone Encounter (Signed)
If you have a cold, it will run it's course. Generally 7-10 days. You can try treating your symptoms.   Mucinex for congestion and cough, a decongestant for nasal congestion, drink plenty of water, use salt water gargles for throat irritation and Tylenol or Ibuprofen for aches and pains. If you develop fever, productive cough or any worrisome symptoms then you should be seen.

## 2017-12-10 NOTE — Telephone Encounter (Signed)
Pt states she started getting a sore throat yesterday and has congestion in her ears. She wants to know what can she do to help protect this from getting worse without her having to come  In.

## 2018-01-19 DIAGNOSIS — H5203 Hypermetropia, bilateral: Secondary | ICD-10-CM | POA: Diagnosis not present

## 2018-01-19 DIAGNOSIS — H524 Presbyopia: Secondary | ICD-10-CM | POA: Diagnosis not present

## 2018-01-19 DIAGNOSIS — H2513 Age-related nuclear cataract, bilateral: Secondary | ICD-10-CM | POA: Diagnosis not present

## 2018-01-19 DIAGNOSIS — H52223 Regular astigmatism, bilateral: Secondary | ICD-10-CM | POA: Diagnosis not present

## 2018-01-29 NOTE — Progress Notes (Addendum)
error 

## 2018-01-29 NOTE — Progress Notes (Signed)
Patient presents for fitting of [place] with [facility]. Brace was dispensed with written and verbal break in instructions. Patient will follow up in 6 weeks with Dr. Paulla Dolly

## 2018-03-02 DIAGNOSIS — F321 Major depressive disorder, single episode, moderate: Secondary | ICD-10-CM | POA: Diagnosis not present

## 2018-03-02 DIAGNOSIS — F411 Generalized anxiety disorder: Secondary | ICD-10-CM | POA: Diagnosis not present

## 2018-03-04 ENCOUNTER — Other Ambulatory Visit: Payer: Self-pay | Admitting: Family Medicine

## 2018-03-04 DIAGNOSIS — Z1231 Encounter for screening mammogram for malignant neoplasm of breast: Secondary | ICD-10-CM

## 2018-03-09 ENCOUNTER — Other Ambulatory Visit (INDEPENDENT_AMBULATORY_CARE_PROVIDER_SITE_OTHER): Payer: Medicare Other

## 2018-03-09 DIAGNOSIS — Z23 Encounter for immunization: Secondary | ICD-10-CM | POA: Diagnosis not present

## 2018-04-07 ENCOUNTER — Ambulatory Visit
Admission: RE | Admit: 2018-04-07 | Discharge: 2018-04-07 | Disposition: A | Discharge status: Home / Self Care | Payer: Medicare Other | Source: Ambulatory Visit | Attending: Family Medicine | Admitting: Family Medicine

## 2018-04-07 DIAGNOSIS — Z1231 Encounter for screening mammogram for malignant neoplasm of breast: Secondary | ICD-10-CM

## 2018-04-21 ENCOUNTER — Other Ambulatory Visit: Payer: Self-pay | Admitting: Family Medicine

## 2018-04-21 DIAGNOSIS — H25013 Cortical age-related cataract, bilateral: Secondary | ICD-10-CM | POA: Diagnosis not present

## 2018-04-21 DIAGNOSIS — M810 Age-related osteoporosis without current pathological fracture: Secondary | ICD-10-CM

## 2018-06-01 DIAGNOSIS — F411 Generalized anxiety disorder: Secondary | ICD-10-CM | POA: Diagnosis not present

## 2018-06-01 DIAGNOSIS — F321 Major depressive disorder, single episode, moderate: Secondary | ICD-10-CM | POA: Diagnosis not present

## 2018-07-16 ENCOUNTER — Other Ambulatory Visit: Payer: Self-pay | Admitting: Family Medicine

## 2018-07-16 DIAGNOSIS — M810 Age-related osteoporosis without current pathological fracture: Secondary | ICD-10-CM

## 2018-07-16 NOTE — Telephone Encounter (Signed)
Pt has an appt in march

## 2018-07-31 ENCOUNTER — Encounter: Payer: Self-pay | Admitting: Family Medicine

## 2018-07-31 ENCOUNTER — Ambulatory Visit (INDEPENDENT_AMBULATORY_CARE_PROVIDER_SITE_OTHER): Payer: PPO | Admitting: Family Medicine

## 2018-07-31 VITALS — BP 118/64 | HR 65 | Ht 60.0 in | Wt 160.0 lb

## 2018-07-31 DIAGNOSIS — E2839 Other primary ovarian failure: Secondary | ICD-10-CM | POA: Diagnosis not present

## 2018-07-31 DIAGNOSIS — F329 Major depressive disorder, single episode, unspecified: Secondary | ICD-10-CM

## 2018-07-31 DIAGNOSIS — E669 Obesity, unspecified: Secondary | ICD-10-CM | POA: Insufficient documentation

## 2018-07-31 DIAGNOSIS — Z7185 Encounter for immunization safety counseling: Secondary | ICD-10-CM

## 2018-07-31 DIAGNOSIS — Z0000 Encounter for general adult medical examination without abnormal findings: Secondary | ICD-10-CM

## 2018-07-31 DIAGNOSIS — Z79899 Other long term (current) drug therapy: Secondary | ICD-10-CM

## 2018-07-31 DIAGNOSIS — M810 Age-related osteoporosis without current pathological fracture: Secondary | ICD-10-CM | POA: Diagnosis not present

## 2018-07-31 DIAGNOSIS — Z7189 Other specified counseling: Secondary | ICD-10-CM

## 2018-07-31 DIAGNOSIS — Z8639 Personal history of other endocrine, nutritional and metabolic disease: Secondary | ICD-10-CM | POA: Diagnosis not present

## 2018-07-31 DIAGNOSIS — R7309 Other abnormal glucose: Secondary | ICD-10-CM | POA: Diagnosis not present

## 2018-07-31 DIAGNOSIS — F419 Anxiety disorder, unspecified: Secondary | ICD-10-CM

## 2018-07-31 DIAGNOSIS — F32A Depression, unspecified: Secondary | ICD-10-CM

## 2018-07-31 LAB — POCT URINALYSIS DIP (PROADVANTAGE DEVICE)
Bilirubin, UA: NEGATIVE
Blood, UA: NEGATIVE
Glucose, UA: NEGATIVE mg/dL
Ketones, POC UA: NEGATIVE mg/dL
LEUKOCYTES UA: NEGATIVE
Nitrite, UA: NEGATIVE
Protein Ur, POC: NEGATIVE mg/dL
Specific Gravity, Urine: 1.015
Urobilinogen, Ur: NEGATIVE
pH, UA: 6 (ref 5.0–8.0)

## 2018-07-31 MED ORDER — ALENDRONATE SODIUM 70 MG PO TABS: ORAL_TABLET | ORAL | 1 refills | Status: DC

## 2018-07-31 MED ORDER — MUPIROCIN 2 % EX OINT: 1.0000 "application " | TOPICAL_OINTMENT | Freq: Two times a day (BID) | CUTANEOUS | 0 refills | Status: DC

## 2018-07-31 NOTE — Progress Notes (Signed)
Stacy Decker is a 68 y.o. female who presents for annual wellness visit, fasting CPe and follow-up on chronic medical conditions.  She has the following concerns:  Osteoporosis-taking alendronate and doing fine with this.  No side effects.  Due for repeat DEXA.  Reports getting adequate calcium, vitamin D and doing weightbearing exercises.  No fractures.  Concerned with her weight.  States she stays up late at night and she does not eat late as well.  She sees her psychiatrist every 3 months.  States her mood is stable.   Immunization History  Administered Date(s) Administered  . Influenza, High Dose Seasonal PF 01/30/2016, 03/09/2018  . Influenza-Unspecified 03/10/2012, 02/12/2013, 03/11/2015, 02/28/2017  . Pneumococcal Conjugate-13 10/11/2014  . Pneumococcal Polysaccharide-23 02/19/2016  . Tdap 09/10/2013  . Zoster 03/10/2012   Last Pap smear: 2019  Last mammogram: 2019 Last colonoscopy: 2018 Last DEXA: 2017 Dentist: Dr. Johnn Hai  Ophtho: recently checked [facility] Exercise: walks her dog and does aerobics twice weekly.   States she fell walking her dog a couple of months ago. No falls since.  Reports that she landed on her left kneecap.  No pain or difficulty walking.  She does report that she can has continued swelling.  Other doctors caring for patient include: Dentist- Dr. Johnn Hai Psychiatrist- Dr. Darleene Cleaver  but sees NP Crystal at [facility]. GI- Dr. Ardis Hughs  Depression screen:  See questionnaire below.  Depression screen [facility] 2/9 07/31/2018 05/29/2017 02/19/2016  Decreased Interest 0 0 1  Down, Depressed, Hopeless 0 0 0  PHQ - 2 Score 0 0 1    Fall Risk Screen: see questionnaire below. Fall Risk  07/31/2018 05/29/2017 05/01/2017 02/19/2016  Falls in the past year? 1 Yes Yes No  Comment - - Emmi Telephone Survey: data to providers prior to load -  Number falls in past yr: 1 1 2  or more -  Comment - - Emmi Telephone Survey Actual Response = 2 -  Injury  with Fall? 1 No No -  Risk for fall due to : - Other (Comment) - -  Risk for fall due to: Comment - tripped - -  Follow up - Falls evaluation completed;Falls prevention discussed - -    ADL screen:  See questionnaire below Functional Status Survey: Is the patient deaf or have difficulty hearing?: No Does the patient have difficulty seeing, even when wearing glasses/contacts?: No Does the patient have difficulty concentrating, remembering, or making decisions?: No Does the patient have difficulty walking or climbing stairs?: No Does the patient have difficulty dressing or bathing?: No Does the patient have difficulty doing errands alone such as visiting a doctor's office or shopping?: No   End of Life Discussion:  Patient does not have a living will and medical power of attorney.  States her old advanced directives at her ex-husband is the healthcare are returning and she needs to change this.  Paperwork was given.  Review of Systems Constitutional: -fever, -chills, -sweats, -unexpected weight change, -anorexia, -fatigue Allergy: -sneezing, -itching, -congestion Dermatology: denies changing moles, rash, lumps, new worrisome lesions ENT: -runny nose, -ear pain, -sore throat, -hoarseness, -sinus pain, -teeth pain, -tinnitus, -hearing loss, -epistaxis Cardiology:  -chest pain, -palpitations, -edema, -orthopnea, -paroxysmal nocturnal dyspnea Respiratory: -cough, -shortness of breath, -dyspnea on exertion, -wheezing, -hemoptysis Gastroenterology: -abdominal pain, -nausea, -vomiting, -diarrhea, -constipation, -blood in stool, -changes in bowel movement, -dysphagia Hematology: -bleeding or bruising problems Musculoskeletal: -arthralgias, -myalgias, -joint swelling, -back pain, -neck pain, -cramping, -gait changes Ophthalmology: -vision  changes, -eye redness, -itching, -discharge Urology: -dysuria, -difficulty urinating, -hematuria, -urinary frequency, -urgency, incontinence Neurology:  -headache, -weakness, -tingling, -numbness, -speech abnormality, -memory loss, -falls, -dizziness Psychology:  -depressed mood, -agitation, -sleep problems    PHYSICAL EXAM:  BP 118/64   Pulse 65   Ht 5' (1.524 m)   Wt 160 lb (72.6 kg)   BMI 31.25 kg/m   General Appearance: Alert, cooperative, no distress, appears stated age Head: Normocephalic, without obvious abnormality, atraumatic Eyes: PERRL, conjunctiva/corneas clear, EOM's intact, fundi benign Ears: Normal TM's and external ear canals Nose: Nares normal, mucosa normal, no drainage or sinus   tenderness Throat: Lips, mucosa, and tongue normal; teeth and gums normal Neck: Supple, no lymphadenopathy; thyroid: no enlargement/tenderness/nodules; no carotid bruit or JVD Back: Spine nontender, no curvature, ROM normal, no CVA tenderness Lungs: Clear to auscultation bilaterally without wheezes, rales or ronchi; respirations unlabored Chest Wall: No tenderness or deformity Heart: Regular rate and rhythm, S1 and S2 normal, no murmur, rub or gallop Breast Exam: Declines.  Mammogram up-to-date. Abdomen: Soft, non-tender, nondistended, normoactive bowel sounds, no masses, no hepatosplenomegaly Genitalia: Declines.  Pap smear up-to-date. Extremities: No clubbing, cyanosis or edema Pulses: 2+ and symmetric all extremities Skin: Skin color, texture, turgor normal, no rashes or lesions Lymph nodes: Cervical, supraclavicular, and axillary nodes normal Neurologic: CNII-XII intact, normal strength, sensation and gait; reflexes 2+ and symmetric throughout Psych: Normal mood, affect, hygiene and grooming.  ASSESSMENT/PLAN: Medicare annual wellness visit, subsequent  Osteoporosis without current pathological fracture, unspecified osteoporosis type - Plan: DG Bone Density, VITAMIN D 25 Hydroxy (Vit-D Deficiency, Fractures), alendronate (FOSAMAX) 70 MG tablet  Elevated hemoglobin A1c - Plan: Hemoglobin A1c  Anxiety and depression  Estrogen  deficiency - Plan: DG Bone Density  Obesity (BMI 30-39.9)  Medication management - Plan: VITAMIN D 25 Hydroxy (Vit-D Deficiency, Fractures)  History of vitamin D deficiency - Plan: VITAMIN D 25 Hydroxy (Vit-D Deficiency, Fractures)  Routine general medical examination at a health care facility - Plan: CBC with Differential/Platelet, Comprehensive metabolic panel, POCT Urinalysis DIP (Proadvantage Device), T4, free, T3, TSH, Lipid panel  Immunization counseling  Advance directive discussed with patient  She appears to be doing well overall.  History of osteoporosis and taking alendronate.  She is also getting adequate calcium and vitamin D.  Weightbearing exercises as well. Bone density-last one was in 2017.  She is overdue for a repeat study.  She will call and schedule this. Immunization counseling.  She will call her insurance carrier if Shingrix is affordable then she will get this at her pharmacy.  Discussed potential side effects.  Otherwise immunizations are up-to-date. Up-to-date with Pap smear, mammogram and colonoscopy. Anxiety and depression managed by her psychiatrist.  She is on medication for this. History of elevated hemoglobin A1c and will repeat this today. Counseling on healthy diet and avoiding eating late at night for weight loss. Advanced directive counseling done.  Discussed monthly self breast exams and yearly mammograms; at least 30 minutes of aerobic activity at least 5 days/week and weight-bearing exercise 2x/week; proper sunscreen use reviewed; healthy diet, including goals of calcium and vitamin D intake and alcohol recommendations (less than or equal to 1 drink/day) reviewed; regular seatbelt use; changing batteries in smoke detectors.  Immunization recommendations discussed.  Colonoscopy recommendations reviewed   Medicare Attestation I have personally reviewed: The patient's medical and social history Their use of alcohol, tobacco or illicit drugs Their  current medications and supplements The patient's functional ability including ADLs,fall risks, home safety risks,  cognitive, and hearing and visual impairment Diet and physical activities Evidence for depression or mood disorders  The patient's weight, height, and BMI have been recorded in the chart.  I have made referrals, counseling, and provided education to the patient based on review of the above and I have provided the patient with a written personalized care plan for preventive services.     Harland Dingwall, NP-C   07/31/2018

## 2018-07-31 NOTE — Patient Instructions (Addendum)
Call and [facility]   Check with your insurance carrier regarding the Shingrix vaccine and if it is affordable and you want this, take the prescription to your pharmacy.   Continue current medications.   Try to avoid eating after 7:30 pm. Increase your physical activity as tolerated.  Limit your carbohydrates and sweets.   We will call you with your lab results.     Preventive Care 49 Years and Older, Female Preventive care refers to lifestyle choices and visits with your health care provider that can promote health and wellness. What does preventive care include?  A yearly physical exam. This is also called an annual well check.  Dental exams once or twice a year.  Routine eye exams. Ask your health care provider how often you should have your eyes checked.  Personal lifestyle choices, including: ? Daily care of your teeth and gums. ? Regular physical activity. ? Eating a healthy diet. ? Avoiding tobacco and drug use. ? Limiting alcohol use. ? Practicing safe sex. ? Taking low-dose aspirin every day. ? Taking vitamin and mineral supplements as recommended by your health care provider. What happens during an annual well check? The services and screenings done by your health care provider during your annual well check will depend on your age, overall health, lifestyle risk factors, and family history of disease. Counseling Your health care provider may ask you questions about your:  Alcohol use.  Tobacco use.  Drug use.  Emotional well-being.  Home and relationship well-being.  Sexual activity.  Eating habits.  History of falls.  Memory and ability to understand (cognition).  Work and work Statistician.  Reproductive health.  Screening You may have the following tests or measurements:  Height, weight, and BMI.  Blood pressure.  Lipid and cholesterol levels. These may be checked every 5 years, or more frequently  if you are over 64 years old.  Skin check.  Lung cancer screening. You may have this screening every year starting at age 76 if you have a 30-pack-year history of smoking and currently smoke or have quit within the past 15 years.  Colorectal cancer screening. All adults should have this screening starting at age 40 and continuing until age 50. You will have tests every 1-10 years, depending on your results and the type of screening test. People at increased risk should start screening at an earlier age. Screening tests may include: ? Guaiac-based fecal occult blood testing. ? Fecal immunochemical test (FIT). ? Stool DNA test. ? Virtual colonoscopy. ? Sigmoidoscopy. During this test, a flexible tube with a tiny camera (sigmoidoscope) is used to examine your rectum and lower colon. The sigmoidoscope is inserted through your anus into your rectum and lower colon. ? Colonoscopy. During this test, a long, thin, flexible tube with a tiny camera (colonoscope) is used to examine your entire colon and rectum.  Hepatitis C blood test.  Hepatitis B blood test.  Sexually transmitted disease (STD) testing.  Diabetes screening. This is done by checking your blood sugar (glucose) after you have not eaten for a while (fasting). You may have this done every 1-3 years.  Bone density scan. This is done to screen for osteoporosis. You may have this done starting at age 60.  Mammogram. This may be done every 1-2 years. Talk to your health care provider about how often you should have regular mammograms. Talk with your health care provider about your test results, treatment options, and if necessary,  the need for more tests. Vaccines Your health care provider may recommend certain vaccines, such as:  Influenza vaccine. This is recommended every year.  Tetanus, diphtheria, and acellular pertussis (Tdap, Td) vaccine. You may need a Td booster every 10 years.  Varicella vaccine. You may need this if you have  not been vaccinated.  Zoster vaccine. You may need this after age 43.  Measles, mumps, and rubella (MMR) vaccine. You may need at least one dose of MMR if you were born in 1957 or later. You may also need a second dose.  Pneumococcal 13-valent conjugate (PCV13) vaccine. One dose is recommended after age 30.  Pneumococcal polysaccharide (PPSV23) vaccine. One dose is recommended after age 40.  Meningococcal vaccine. You may need this if you have certain conditions.  Hepatitis A vaccine. You may need this if you have certain conditions or if you travel or work in places where you may be exposed to hepatitis A.  Hepatitis B vaccine. You may need this if you have certain conditions or if you travel or work in places where you may be exposed to hepatitis B.  Haemophilus influenzae type b (Hib) vaccine. You may need this if you have certain conditions. Talk to your health care provider about which screenings and vaccines you need and how often you need them. This information is not intended to replace advice given to you by your health care provider. Make sure you discuss any questions you have with your health care provider. Document Released: 06/09/2015 Document Revised: 07/03/2017 Document Reviewed: 03/14/2015 Elsevier Interactive Patient Education  2019 [employer].

## 2018-08-01 LAB — COMPREHENSIVE METABOLIC PANEL
A/G RATIO: 2.5 — AB (ref 1.2–2.2)
ALT: 10 IU/L (ref 0–32)
AST: 23 IU/L (ref 0–40)
Albumin: 4.2 g/dL (ref 3.8–4.8)
Alkaline Phosphatase: 88 IU/L (ref 39–117)
BUN/Creatinine Ratio: 21 (ref 12–28)
BUN: 16 mg/dL (ref 8–27)
Bilirubin Total: 0.3 mg/dL (ref 0.0–1.2)
CO2: 20 mmol/L (ref 20–29)
Calcium: 9.4 mg/dL (ref 8.7–10.3)
Chloride: 104 mmol/L (ref 96–106)
Creatinine, Ser: 0.75 mg/dL (ref 0.57–1.00)
GFR calc Af Amer: 94 mL/min/{1.73_m2} (ref >59)
GFR calc non Af Amer: 82 mL/min/{1.73_m2} (ref >59)
Globulin, Total: 1.7 g/dL (ref 1.5–4.5)
Glucose: 94 mg/dL (ref 65–99)
Potassium: 5.7 mmol/L — ABNORMAL HIGH (ref 3.5–5.2)
Sodium: 142 mmol/L (ref 134–144)
Total Protein: 5.9 g/dL — ABNORMAL LOW (ref 6.0–8.5)

## 2018-08-01 LAB — CBC WITH DIFFERENTIAL/PLATELET
Basophils Absolute: 0.1 10*3/uL (ref 0.0–0.2)
Basos: 1 %
EOS (ABSOLUTE): 0.5 10*3/uL — ABNORMAL HIGH (ref 0.0–0.4)
Eos: 6 %
Hematocrit: 42.2 % (ref 34.0–46.6)
Hemoglobin: 14.5 g/dL (ref 11.1–15.9)
IMMATURE GRANULOCYTES: 0 %
Immature Grans (Abs): 0 10*3/uL (ref 0.0–0.1)
Lymphocytes Absolute: 1.4 10*3/uL (ref 0.7–3.1)
Lymphs: 17 %
MCH: 29.5 pg (ref 26.6–33.0)
MCHC: 34.4 g/dL (ref 31.5–35.7)
MCV: 86 fL (ref 79–97)
MONOS ABS: 1 10*3/uL — AB (ref 0.1–0.9)
Monocytes: 13 %
Neutrophils Absolute: 5.1 10*3/uL (ref 1.4–7.0)
Neutrophils: 63 %
Platelets: 272 10*3/uL (ref 150–450)
RBC: 4.91 x10E6/uL (ref 3.77–5.28)
RDW: 12.6 % (ref 11.7–15.4)
WBC: 8.1 10*3/uL (ref 3.4–10.8)

## 2018-08-01 LAB — TSH: TSH: 2.27 u[IU]/mL (ref 0.450–4.500)

## 2018-08-01 LAB — T4, FREE: Free T4: 0.91 ng/dL (ref 0.82–1.77)

## 2018-08-01 LAB — LIPID PANEL
Chol/HDL Ratio: 3.8 ratio (ref 0.0–4.4)
Cholesterol, Total: 156 mg/dL (ref 100–199)
HDL: 41 mg/dL (ref >39)
LDL Calculated: 85 mg/dL (ref 0–99)
Triglycerides: 151 mg/dL — ABNORMAL HIGH (ref 0–149)
VLDL Cholesterol Cal: 30 mg/dL (ref 5–40)

## 2018-08-01 LAB — T3: T3, Total: 109 ng/dL (ref 71–180)

## 2018-08-01 LAB — HEMOGLOBIN A1C
Est. average glucose Bld gHb Est-mCnc: 120 mg/dL
Hgb A1c MFr Bld: 5.8 % — ABNORMAL HIGH (ref 4.8–5.6)

## 2018-08-01 LAB — VITAMIN D 25 HYDROXY (VIT D DEFICIENCY, FRACTURES): Vit D, 25-Hydroxy: 65.2 ng/mL (ref 30.0–100.0)

## 2018-08-03 ENCOUNTER — Other Ambulatory Visit: Payer: Self-pay | Admitting: Internal Medicine

## 2018-08-03 DIAGNOSIS — E875 Hyperkalemia: Secondary | ICD-10-CM

## 2018-08-06 ENCOUNTER — Other Ambulatory Visit: Payer: PPO

## 2018-08-06 ENCOUNTER — Other Ambulatory Visit: Payer: Self-pay

## 2018-08-06 DIAGNOSIS — E875 Hyperkalemia: Secondary | ICD-10-CM

## 2018-08-07 LAB — BASIC METABOLIC PANEL
BUN/Creatinine Ratio: 17 (ref 12–28)
BUN: 12 mg/dL (ref 8–27)
CO2: 24 mmol/L (ref 20–29)
Calcium: 9.3 mg/dL (ref 8.7–10.3)
Chloride: 102 mmol/L (ref 96–106)
Creatinine, Ser: 0.7 mg/dL (ref 0.57–1.00)
GFR calc Af Amer: 102 mL/min/{1.73_m2} (ref >59)
GFR, EST NON AFRICAN AMERICAN: 89 mL/min/{1.73_m2} (ref >59)
Glucose: 97 mg/dL (ref 65–99)
Potassium: 5.3 mmol/L — ABNORMAL HIGH (ref 3.5–5.2)
SODIUM: 141 mmol/L (ref 134–144)

## 2018-08-10 ENCOUNTER — Ambulatory Visit
Admission: RE | Admit: 2018-08-10 | Discharge: 2018-08-10 | Disposition: A | Discharge status: Home / Self Care | Payer: PPO | Source: Ambulatory Visit | Attending: Family Medicine | Admitting: Family Medicine

## 2018-08-10 ENCOUNTER — Other Ambulatory Visit: Payer: Self-pay

## 2018-08-10 DIAGNOSIS — M810 Age-related osteoporosis without current pathological fracture: Secondary | ICD-10-CM

## 2018-08-10 DIAGNOSIS — E2839 Other primary ovarian failure: Secondary | ICD-10-CM

## 2018-08-10 DIAGNOSIS — Z780 Asymptomatic menopausal state: Secondary | ICD-10-CM | POA: Diagnosis not present

## 2018-08-12 ENCOUNTER — Encounter: Payer: Self-pay | Admitting: Internal Medicine

## 2018-08-12 ENCOUNTER — Other Ambulatory Visit: Payer: Self-pay | Admitting: Internal Medicine

## 2018-08-12 DIAGNOSIS — M810 Age-related osteoporosis without current pathological fracture: Secondary | ICD-10-CM

## 2018-08-12 MED ORDER — ALENDRONATE SODIUM 70 MG PO TABS: ORAL_TABLET | ORAL | 3 refills | Status: DC

## 2018-08-25 DIAGNOSIS — F321 Major depressive disorder, single episode, moderate: Secondary | ICD-10-CM | POA: Diagnosis not present

## 2018-08-25 DIAGNOSIS — F411 Generalized anxiety disorder: Secondary | ICD-10-CM | POA: Diagnosis not present

## 2018-09-01 ENCOUNTER — Other Ambulatory Visit: Payer: Medicare Other

## 2018-11-20 DIAGNOSIS — F321 Major depressive disorder, single episode, moderate: Secondary | ICD-10-CM | POA: Diagnosis not present

## 2018-11-20 DIAGNOSIS — F411 Generalized anxiety disorder: Secondary | ICD-10-CM | POA: Diagnosis not present

## 2019-02-22 DIAGNOSIS — F321 Major depressive disorder, single episode, moderate: Secondary | ICD-10-CM | POA: Diagnosis not present

## 2019-02-22 DIAGNOSIS — F411 Generalized anxiety disorder: Secondary | ICD-10-CM | POA: Diagnosis not present

## 2019-04-09 ENCOUNTER — Other Ambulatory Visit: Payer: Self-pay | Admitting: Family Medicine

## 2019-04-09 DIAGNOSIS — Z1231 Encounter for screening mammogram for malignant neoplasm of breast: Secondary | ICD-10-CM

## 2019-05-14 DIAGNOSIS — Z03818 Encounter for observation for suspected exposure to other biological agents ruled out: Secondary | ICD-10-CM | POA: Diagnosis not present

## 2019-05-17 DIAGNOSIS — F321 Major depressive disorder, single episode, moderate: Secondary | ICD-10-CM | POA: Diagnosis not present

## 2019-05-17 DIAGNOSIS — F411 Generalized anxiety disorder: Secondary | ICD-10-CM | POA: Diagnosis not present

## 2019-06-07 ENCOUNTER — Ambulatory Visit
Admission: RE | Admit: 2019-06-07 | Discharge: 2019-06-07 | Disposition: A | Discharge status: Home / Self Care | Payer: PPO | Source: Ambulatory Visit | Attending: Family Medicine | Admitting: Family Medicine

## 2019-06-07 ENCOUNTER — Other Ambulatory Visit: Payer: Self-pay

## 2019-06-07 DIAGNOSIS — Z1231 Encounter for screening mammogram for malignant neoplasm of breast: Secondary | ICD-10-CM | POA: Diagnosis not present

## 2019-07-03 ENCOUNTER — Other Ambulatory Visit: Payer: Self-pay | Admitting: Family Medicine

## 2019-07-03 DIAGNOSIS — M810 Age-related osteoporosis without current pathological fracture: Secondary | ICD-10-CM

## 2019-07-05 NOTE — Telephone Encounter (Signed)
Pt has an appt in march

## 2019-07-10 ENCOUNTER — Ambulatory Visit: Payer: PPO | Attending: Internal Medicine

## 2019-07-10 DIAGNOSIS — Z23 Encounter for immunization: Secondary | ICD-10-CM | POA: Insufficient documentation

## 2019-07-10 NOTE — Progress Notes (Signed)
   Covid-19 Vaccination Clinic  Name:  Stacy Decker    MRN: [id number] DOB: [birth year]  07/10/2019  [name] was observed post Covid-19 immunization for 15 minutes without incidence. She was provided with Vaccine Information Sheet and instruction to access the V-Safe system.   [name] was instructed to call 911 with any severe reactions post vaccine: Marland Kitchen Difficulty breathing  . Swelling of your face and throat  . A fast heartbeat  . A bad rash all over your body  . Dizziness and weakness    Immunizations Administered    Name Date Dose VIS Date Route   Pfizer COVID-19 Vaccine 07/10/2019  3:00 PM 0.3 mL 05/07/2019 Intramuscular   Manufacturer: [place]   Lot: [id number]   [place]: [id number]

## 2019-08-02 ENCOUNTER — Ambulatory Visit: Payer: PPO | Attending: Internal Medicine

## 2019-08-02 DIAGNOSIS — Z23 Encounter for immunization: Secondary | ICD-10-CM | POA: Insufficient documentation

## 2019-08-02 NOTE — Progress Notes (Signed)
   Covid-19 Vaccination Clinic  Name:  Stacy Decker    MRN: [id number] DOB: [birth year]  08/02/2019  [name] was observed post Covid-19 immunization for 15 minutes without incident. She was provided with Vaccine Information Sheet and instruction to access the V-Safe system.   [name] was instructed to call 911 with any severe reactions post vaccine: Marland Kitchen Difficulty breathing  . Swelling of face and throat  . A fast heartbeat  . A bad rash all over body  . Dizziness and weakness   Immunizations Administered    Name Date Dose VIS Date Route   Pfizer COVID-19 Vaccine 08/02/2019  3:40 PM 0.3 mL 05/07/2019 Intramuscular   Manufacturer: [place]   Lot: [id number]   [place]: [id number]

## 2019-08-08 NOTE — Progress Notes (Signed)
Stacy Decker is a 69 y.o. female who presents for annual wellness visit, CPE and follow-up on chronic medical conditions.  She has the following concerns:  Reports a 3 month history of soft stools but more normal lately. States she switched her milk to almond milk and this seems to have helped. Her diet appears to play a role in her bowel movements. Denies any blood in her stool. Colonoscopy UTD.   She had one fall since our last visit. States she tripped walking her dog.  No issues taking care of herself.   Working for [employer] as a Research scientist (physical sciences).     Immunization History  Administered Date(s) Administered  . Influenza, High Dose Seasonal PF 01/30/2016, 03/09/2018  . Influenza-Unspecified 03/10/2012, 02/12/2013, 03/11/2015, 02/28/2017  . PFIZER SARS-COV-2 Vaccination 07/10/2019, 08/02/2019  . Pneumococcal Conjugate-13 10/11/2014  . Pneumococcal Polysaccharide-23 02/19/2016  . Tdap 09/10/2013  . Zoster 03/10/2012   Last Pap smear: 2019 Last mammogram: January 2021 Last colonoscopy: 2018 and due for recall in 2028  Last DEXA: 08/10/2018 Dentist: every 6 month Ophtho: [facility] Exercise: walking the dog  Other doctors caring for patient include: Dentist- Dr. Doreen Beam [facility]- Dr. Ardis Hughs  Psychiatrist- Dr. Darleene Cleaver and Stephannie Peters NP Therapist- every 2 weeks with [facility] in [place]- Dr. Sabra Heck  Dr. Doran Durand   Depression screen:  See questionnaire below.  Depression screen [facility] 2/9 08/09/2019 07/31/2018 05/29/2017 02/19/2016  Decreased Interest 3 0 0 1  Down, Depressed, Hopeless 3 0 0 0  PHQ - 2 Score 6 0 0 1  Altered sleeping 3 - - -  Tired, decreased energy 3 - - -  Change in appetite 0 - - -  Feeling bad or failure about yourself  1 - - -  Trouble concentrating 0 - - -  Moving slowly or fidgety/restless 0 - - -  Suicidal thoughts 0 - - -  PHQ-9 Score 13 - - -  Difficult doing work/chores Not difficult at all - - -    Fall Risk Screen: see  questionnaire below. Fall Risk  08/09/2019 07/31/2018 05/29/2017 05/01/2017 02/19/2016  Falls in the past year? 1 1 Yes Yes No  Comment - - - Emmi Telephone Survey: data to providers prior to load -  Number falls in past yr: 1 1 1 2  or more -  Comment - - - Emmi Telephone Survey Actual Response = 2 -  Injury with Fall? 1 1 No No -  Risk for fall due to : - - Other (Comment) - -  Risk for fall due to: Comment - - tripped - -  Follow up - - Falls evaluation completed;Falls prevention discussed - -    ADL screen:  See questionnaire below Functional Status Survey: Is the patient deaf or have difficulty hearing?: No Does the patient have difficulty seeing, even when wearing glasses/contacts?: No Does the patient have difficulty concentrating, remembering, or making decisions?: No Does the patient have difficulty walking or climbing stairs?: Yes(get short winded) Does the patient have difficulty dressing or bathing?: No Does the patient have difficulty doing errands alone such as visiting a doctor's office or shopping?: No   End of Life Discussion:  Patient has a living will and medical power of attorney. Brought in the documents today to be notarized. MOST form filled out today.   Review of Systems Constitutional: -fever, -chills, -sweats, -unexpected weight change, -anorexia, -fatigue Allergy: -sneezing, -itching, -congestion Dermatology: denies changing moles, rash, lumps, new worrisome lesions ENT: -runny  nose, -ear pain, -sore throat, -hoarseness, -sinus pain, -teeth pain, -tinnitus, -hearing loss, -epistaxis Cardiology:  -chest pain, -palpitations, -edema, -orthopnea, -paroxysmal nocturnal dyspnea Respiratory: -cough, -shortness of breath, -dyspnea on exertion, -wheezing, -hemoptysis Gastroenterology: -abdominal pain, -nausea, -vomiting, +diarrhea, -constipation, -blood in stool, +changes in bowel movement, -dysphagia Hematology: -bleeding or bruising problems Musculoskeletal: -arthralgias,  -myalgias, -joint swelling, -back pain, -neck pain, -cramping, -gait changes Ophthalmology: -vision changes, -eye redness, -itching, -discharge Urology: -dysuria, -difficulty urinating, -hematuria, -urinary frequency, -urgency, incontinence Neurology: -headache, -weakness, -tingling, -numbness, -speech abnormality, -memory loss, + (1 mechanical) fall, -dizziness Psychology:  -depressed mood, -agitation, -sleep problems    PHYSICAL EXAM:  BP 128/68   Pulse 71   Temp 98 F (36.7 C)   Ht 5' 0.5" (1.537 m)   Wt 145 lb (65.8 kg)   BMI 27.85 kg/m   General Appearance: Alert, cooperative, no distress, appears stated age Head: Normocephalic, without obvious abnormality, atraumatic Eyes: PERRL, conjunctiva/corneas clear, EOM's intact Ears: Normal TM's and external ear canals Nose: Mask in place Throat: Mask in place Neck: Supple, no lymphadenopathy; thyroid: no enlargement/tenderness/nodules; no JVD Back: Spine nontender,  ROM normal, no CVA tenderness Lungs: Clear to auscultation bilaterally without wheezes, rales or ronchi; respirations unlabored Chest Wall: No tenderness or deformity Heart: Regular rate and rhythm, S1 and S2 normal, no murmur, rub or gallop Breast Exam: Declines.  Mammogram up-to-date Abdomen: Soft, non-tender, nondistended, normoactive bowel sounds, no masses, no hepatosplenomegaly Genitalia: Declines.  Pap smear up-to-date Extremities: No clubbing, cyanosis or edema Pulses: 2+ and symmetric all extremities Skin: Skin color, texture, turgor normal, no rashes or lesions Lymph nodes: Cervical, supraclavicular, and axillary nodes normal Neurologic: CNII-XII intact, normal strength, sensation and gait; reflexes 2+ and symmetric throughout Psych: Normal mood, affect, hygiene and grooming.  ASSESSMENT/PLAN: Medicare annual wellness visit, subsequent -She is here today for a Medicare annual wellness visit.  Reports living alone, with her dog, and doing well.  Her mood  is good.  She had 1 fall due to walking her dog but did not get injured.  Denies any difficulty with ADLs.  Advanced directives discussed and she does have her living will and [facility] POA with her today to have notarized.  MOST form filled out.  Routine general medical examination at a health care facility - Plan: CBC with Differential/Platelet, Comprehensive metabolic panel, EKG [id number], Lipid panel -She is also here today for a fasting CPE.  Preventive health care reviewed and she is up-to-date.  Up-to-date with immunizations.  Counseling on healthy diet and exercise.  Anxiety and depression -She is seeing her psychiatrist and therapist and doing well.  Osteoporosis without current pathological fracture, unspecified osteoporosis type - Plan: VITAMIN D 25 Hydroxy (Vit-D Deficiency, Fractures) -Counseling on getting adequate calcium in her diet, taking her vitamin D supplement and weightbearing exercises.  She is also taking alendronate weekly without any issues.  She does seem to be taking it appropriately.  Estrogen deficiency  Prediabetes - Plan: Hemoglobin A1c -Feels that her diet is fairly healthy.  Check A1c and follow-up  Obesity (BMI 30-39.9) - Plan: Hemoglobin A1c, Lipid panel -Counseling on healthy diet and exercise  History of vitamin D deficiency - Plan: VITAMIN D 25 Hydroxy (Vit-D Deficiency, Fractures) -Explained importance of vitamin D and preventing worsening osteoporosis.  Check vitamin D level and follow-up  Screening for heart disease - Plan: EKG 12-Lead ECG shows NSR, rate 65, unremarkable. Read by Dr. Redmond School and myself.     Discussed monthly self breast exams and yearly  mammograms; at least 30 minutes of aerobic activity at least 5 days/week and weight-bearing exercise 2x/week; proper sunscreen use reviewed; healthy diet, including goals of calcium and vitamin D intake and alcohol recommendations (less than or equal to 1 drink/day) reviewed; regular seatbelt use; changing  batteries in smoke detectors.  Immunization recommendations discussed.  Colonoscopy recommendations reviewed   Medicare Attestation I have personally reviewed: The patient's medical and social history Their use of alcohol, tobacco or illicit drugs Their current medications and supplements The patient's functional ability including ADLs,fall risks, home safety risks, cognitive, and hearing and visual impairment Diet and physical activities Evidence for depression or mood disorders  The patient's weight, height, and BMI have been recorded in the chart.  I have made referrals, counseling, and provided education to the patient based on review of the above and I have provided the patient with a written personalized care plan for preventive services.     Harland Dingwall, NP-C   08/09/2019

## 2019-08-09 ENCOUNTER — Ambulatory Visit (INDEPENDENT_AMBULATORY_CARE_PROVIDER_SITE_OTHER): Payer: PPO | Admitting: Family Medicine

## 2019-08-09 ENCOUNTER — Other Ambulatory Visit: Payer: Self-pay

## 2019-08-09 ENCOUNTER — Encounter: Payer: Self-pay | Admitting: Family Medicine

## 2019-08-09 VITALS — BP 128/68 | HR 71 | Temp 98.0°F | Ht 60.5 in | Wt 145.0 lb

## 2019-08-09 DIAGNOSIS — F32A Depression, unspecified: Secondary | ICD-10-CM

## 2019-08-09 DIAGNOSIS — F419 Anxiety disorder, unspecified: Secondary | ICD-10-CM | POA: Diagnosis not present

## 2019-08-09 DIAGNOSIS — R7303 Prediabetes: Secondary | ICD-10-CM | POA: Diagnosis not present

## 2019-08-09 DIAGNOSIS — F329 Major depressive disorder, single episode, unspecified: Secondary | ICD-10-CM | POA: Diagnosis not present

## 2019-08-09 DIAGNOSIS — M810 Age-related osteoporosis without current pathological fracture: Secondary | ICD-10-CM

## 2019-08-09 DIAGNOSIS — Z8639 Personal history of other endocrine, nutritional and metabolic disease: Secondary | ICD-10-CM

## 2019-08-09 DIAGNOSIS — Z0000 Encounter for general adult medical examination without abnormal findings: Secondary | ICD-10-CM | POA: Diagnosis not present

## 2019-08-09 DIAGNOSIS — E669 Obesity, unspecified: Secondary | ICD-10-CM

## 2019-08-09 DIAGNOSIS — E2839 Other primary ovarian failure: Secondary | ICD-10-CM | POA: Diagnosis not present

## 2019-08-09 DIAGNOSIS — Z136 Encounter for screening for cardiovascular disorders: Secondary | ICD-10-CM | POA: Diagnosis not present

## 2019-08-09 NOTE — Patient Instructions (Signed)
Stacy Decker , Thank you for taking time to come for your Medicare Wellness Visit. I appreciate your ongoing commitment to your health goals. Please review the following plan we discussed and let me know if I can assist you in the future.   These are the goals we discussed:  Keep an eye on your bowel movements and any food triggers. Let me or Dr. Ardis Hughs know if this is getting worse.   This is a list of the screening recommended for you and due dates:  Health Maintenance  Topic Date Due  . Flu Shot  12/26/2018  . Mammogram  06/06/2021  . Tetanus Vaccine  09/11/2023  . Colon Cancer Screening  07/30/2026  . DEXA scan (bone density measurement)  Completed  .  Hepatitis C: One time screening is recommended by Center for Disease Control  (CDC) for  adults born from [birth year] through 1966.   Completed  . Pneumonia vaccines  Completed        Preventive Care 42 Years and Older, Female Preventive care refers to lifestyle choices and visits with your health care provider that can promote health and wellness. This includes:  A yearly physical exam. This is also called an annual well check.  Regular dental and eye exams.  Immunizations.  Screening for certain conditions.  Healthy lifestyle choices, such as diet and exercise. What can I expect for my preventive care visit? Physical exam Your health care provider will check:  Height and weight. These may be used to calculate body mass index (BMI), which is a measurement that tells if you are at a healthy weight.  Heart rate and blood pressure.  Your skin for abnormal spots. Counseling Your health care provider may ask you questions about:  Alcohol, tobacco, and drug use.  Emotional well-being.  Home and relationship well-being.  Sexual activity.  Eating habits.  History of falls.  Memory and ability to understand (cognition).  Work and work Statistician.  Pregnancy and menstrual history. What immunizations do I  need?  Influenza (flu) vaccine  This is recommended every year. Tetanus, diphtheria, and pertussis (Tdap) vaccine  You may need a Td booster every 10 years. Varicella (chickenpox) vaccine  You may need this vaccine if you have not already been vaccinated. Zoster (shingles) vaccine  You may need this after age 10. Pneumococcal conjugate (PCV13) vaccine  One dose is recommended after age 35. Pneumococcal polysaccharide (PPSV23) vaccine  One dose is recommended after age 60. Measles, mumps, and rubella (MMR) vaccine  You may need at least one dose of MMR if you were born in 1957 or later. You may also need a second dose. Meningococcal conjugate (MenACWY) vaccine  You may need this if you have certain conditions. Hepatitis A vaccine  You may need this if you have certain conditions or if you travel or work in places where you may be exposed to hepatitis A. Hepatitis B vaccine  You may need this if you have certain conditions or if you travel or work in places where you may be exposed to hepatitis B. Haemophilus influenzae type b (Hib) vaccine  You may need this if you have certain conditions. You may receive vaccines as individual doses or as more than one vaccine together in one shot (combination vaccines). Talk with your health care provider about the risks and benefits of combination vaccines. What tests do I need? Blood tests  Lipid and cholesterol levels. These may be checked every 5 years, or more frequently depending on your  overall health.  Hepatitis C test.  Hepatitis B test. Screening  Lung cancer screening. You may have this screening every year starting at age 66 if you have a 30-pack-year history of smoking and currently smoke or have quit within the past 15 years.  Colorectal cancer screening. All adults should have this screening starting at age 50 and continuing until age 28. Your health care provider may recommend screening at age 14 if you are at  increased risk. You will have tests every 1-10 years, depending on your results and the type of screening test.  Diabetes screening. This is done by checking your blood sugar (glucose) after you have not eaten for a while (fasting). You may have this done every 1-3 years.  Mammogram. This may be done every 1-2 years. Talk with your health care provider about how often you should have regular mammograms.  BRCA-related cancer screening. This may be done if you have a family history of breast, ovarian, tubal, or peritoneal cancers. Other tests  Sexually transmitted disease (STD) testing.  Bone density scan. This is done to screen for osteoporosis. You may have this done starting at age 23. Follow these instructions at home: Eating and drinking  Eat a diet that includes fresh fruits and vegetables, whole grains, lean protein, and low-fat dairy products. Limit your intake of foods with high amounts of sugar, saturated fats, and salt.  Take vitamin and mineral supplements as recommended by your health care provider.  Do not drink alcohol if your health care provider tells you not to drink.  If you drink alcohol: ? Limit how much you have to 0-1 drink a day. ? Be aware of how much alcohol is in your drink. In the [place] one drink equals one 12 oz bottle of beer (355 mL), one 5 oz glass of wine (148 mL), or one 1 oz glass of hard liquor (44 mL). Lifestyle  Take daily care of your teeth and gums.  Stay active. Exercise for at least 30 minutes on 5 or more days each week.  Do not use any products that contain nicotine or tobacco, such as cigarettes, e-cigarettes, and chewing tobacco. If you need help quitting, ask your health care provider.  If you are sexually active, practice safe sex. Use a condom or other form of protection in order to prevent STIs (sexually transmitted infections).  Talk with your health care provider about taking a low-dose aspirin or statin. What's next?  Go to your  health care provider once a year for a well check visit.  Ask your health care provider how often you should have your eyes and teeth checked.  Stay up to date on all vaccines. This information is not intended to replace advice given to you by your health care provider. Make sure you discuss any questions you have with your health care provider. Document Revised: 05/07/2018 Document Reviewed: 05/07/2018 Elsevier Patient Education  2020 [employer].

## 2019-08-10 ENCOUNTER — Encounter: Payer: Self-pay | Admitting: Family Medicine

## 2019-08-10 LAB — COMPREHENSIVE METABOLIC PANEL
ALT: 11 IU/L (ref 0–32)
AST: 18 IU/L (ref 0–40)
Albumin/Globulin Ratio: 2.6 — ABNORMAL HIGH (ref 1.2–2.2)
Albumin: 4.2 g/dL (ref 3.8–4.8)
Alkaline Phosphatase: 89 IU/L (ref 39–117)
BUN/Creatinine Ratio: 19 (ref 12–28)
BUN: 14 mg/dL (ref 8–27)
Bilirubin Total: 0.4 mg/dL (ref 0.0–1.2)
CO2: 23 mmol/L (ref 20–29)
Calcium: 9.2 mg/dL (ref 8.7–10.3)
Chloride: 104 mmol/L (ref 96–106)
Creatinine, Ser: 0.72 mg/dL (ref 0.57–1.00)
GFR calc Af Amer: 98 mL/min/{1.73_m2} (ref >59)
GFR calc non Af Amer: 85 mL/min/{1.73_m2} (ref >59)
Globulin, Total: 1.6 g/dL (ref 1.5–4.5)
Glucose: 86 mg/dL (ref 65–99)
Potassium: 4.5 mmol/L (ref 3.5–5.2)
Sodium: 141 mmol/L (ref 134–144)
Total Protein: 5.8 g/dL — ABNORMAL LOW (ref 6.0–8.5)

## 2019-08-10 LAB — CBC WITH DIFFERENTIAL/PLATELET
Basophils Absolute: 0.1 10*3/uL (ref 0.0–0.2)
Basos: 1 %
EOS (ABSOLUTE): 0.4 10*3/uL (ref 0.0–0.4)
Eos: 5 %
Hematocrit: 42.8 % (ref 34.0–46.6)
Hemoglobin: 14.6 g/dL (ref 11.1–15.9)
Immature Grans (Abs): 0 10*3/uL (ref 0.0–0.1)
Immature Granulocytes: 0 %
Lymphocytes Absolute: 1.9 10*3/uL (ref 0.7–3.1)
Lymphs: 23 %
MCH: 30.9 pg (ref 26.6–33.0)
MCHC: 34.1 g/dL (ref 31.5–35.7)
MCV: 91 fL (ref 79–97)
Monocytes Absolute: 0.9 10*3/uL (ref 0.1–0.9)
Monocytes: 11 %
Neutrophils Absolute: 4.8 10*3/uL (ref 1.4–7.0)
Neutrophils: 60 %
Platelets: 272 10*3/uL (ref 150–450)
RBC: 4.72 x10E6/uL (ref 3.77–5.28)
RDW: 12.2 % (ref 11.7–15.4)
WBC: 8.1 10*3/uL (ref 3.4–10.8)

## 2019-08-10 LAB — LIPID PANEL
Chol/HDL Ratio: 3.8 ratio (ref 0.0–4.4)
Cholesterol, Total: 156 mg/dL (ref 100–199)
HDL: 41 mg/dL (ref >39)
LDL Chol Calc (NIH): 89 mg/dL (ref 0–99)
Triglycerides: 149 mg/dL (ref 0–149)
VLDL Cholesterol Cal: 26 mg/dL (ref 5–40)

## 2019-08-10 LAB — VITAMIN D 25 HYDROXY (VIT D DEFICIENCY, FRACTURES): Vit D, 25-Hydroxy: 56.5 ng/mL (ref 30.0–100.0)

## 2019-08-10 LAB — HEMOGLOBIN A1C
Est. average glucose Bld gHb Est-mCnc: 108 mg/dL
Hgb A1c MFr Bld: 5.4 % (ref 4.8–5.6)

## 2019-08-11 DIAGNOSIS — F321 Major depressive disorder, single episode, moderate: Secondary | ICD-10-CM | POA: Diagnosis not present

## 2019-08-11 DIAGNOSIS — F411 Generalized anxiety disorder: Secondary | ICD-10-CM | POA: Diagnosis not present

## 2019-09-21 ENCOUNTER — Other Ambulatory Visit: Payer: Self-pay | Admitting: Family Medicine

## 2019-09-21 DIAGNOSIS — M810 Age-related osteoporosis without current pathological fracture: Secondary | ICD-10-CM

## 2019-11-05 DIAGNOSIS — F321 Major depressive disorder, single episode, moderate: Secondary | ICD-10-CM | POA: Diagnosis not present

## 2019-11-05 DIAGNOSIS — F411 Generalized anxiety disorder: Secondary | ICD-10-CM | POA: Diagnosis not present

## 2019-12-26 ENCOUNTER — Other Ambulatory Visit: Payer: Self-pay | Admitting: Family Medicine

## 2019-12-26 DIAGNOSIS — M810 Age-related osteoporosis without current pathological fracture: Secondary | ICD-10-CM

## 2020-02-09 ENCOUNTER — Other Ambulatory Visit: Payer: Self-pay

## 2020-02-09 ENCOUNTER — Ambulatory Visit (INDEPENDENT_AMBULATORY_CARE_PROVIDER_SITE_OTHER): Payer: PPO | Admitting: Family Medicine

## 2020-02-09 ENCOUNTER — Encounter: Payer: Self-pay | Admitting: Family Medicine

## 2020-02-09 VITALS — BP 122/80 | HR 86 | Wt 150.6 lb

## 2020-02-09 DIAGNOSIS — E2839 Other primary ovarian failure: Secondary | ICD-10-CM | POA: Diagnosis not present

## 2020-02-09 DIAGNOSIS — M810 Age-related osteoporosis without current pathological fracture: Secondary | ICD-10-CM

## 2020-02-09 DIAGNOSIS — I781 Nevus, non-neoplastic: Secondary | ICD-10-CM | POA: Insufficient documentation

## 2020-02-09 DIAGNOSIS — Z79899 Other long term (current) drug therapy: Secondary | ICD-10-CM

## 2020-02-09 DIAGNOSIS — D229 Melanocytic nevi, unspecified: Secondary | ICD-10-CM | POA: Diagnosis not present

## 2020-02-09 DIAGNOSIS — Z23 Encounter for immunization: Secondary | ICD-10-CM | POA: Diagnosis not present

## 2020-02-09 LAB — CBC WITH DIFFERENTIAL/PLATELET
Basophils Absolute: 0.1 10*3/uL (ref 0.0–0.2)
Basos: 1 %
EOS (ABSOLUTE): 0.4 10*3/uL (ref 0.0–0.4)
Eos: 5 %
Hematocrit: 43.2 % (ref 34.0–46.6)
Hemoglobin: 14.1 g/dL (ref 11.1–15.9)
Immature Grans (Abs): 0 10*3/uL (ref 0.0–0.1)
Immature Granulocytes: 0 %
Lymphocytes Absolute: 1.6 10*3/uL (ref 0.7–3.1)
Lymphs: 20 %
MCH: 28.6 pg (ref 26.6–33.0)
MCHC: 32.6 g/dL (ref 31.5–35.7)
MCV: 88 fL (ref 79–97)
Monocytes Absolute: 1.1 10*3/uL — ABNORMAL HIGH (ref 0.1–0.9)
Monocytes: 14 %
Neutrophils Absolute: 4.9 10*3/uL (ref 1.4–7.0)
Neutrophils: 60 %
Platelets: 277 10*3/uL (ref 150–450)
RBC: 4.93 x10E6/uL (ref 3.77–5.28)
RDW: 12.5 % (ref 11.7–15.4)
WBC: 8.2 10*3/uL (ref 3.4–10.8)

## 2020-02-09 LAB — COMPREHENSIVE METABOLIC PANEL
ALT: 10 IU/L (ref 0–32)
AST: 17 IU/L (ref 0–40)
Albumin/Globulin Ratio: 2.1 (ref 1.2–2.2)
Albumin: 4.1 g/dL (ref 3.8–4.8)
Alkaline Phosphatase: 105 IU/L (ref 44–121)
BUN/Creatinine Ratio: 20 (ref 12–28)
BUN: 14 mg/dL (ref 8–27)
Bilirubin Total: 0.3 mg/dL (ref 0.0–1.2)
CO2: 23 mmol/L (ref 20–29)
Calcium: 9.5 mg/dL (ref 8.7–10.3)
Chloride: 104 mmol/L (ref 96–106)
Creatinine, Ser: 0.7 mg/dL (ref 0.57–1.00)
GFR calc Af Amer: 102 mL/min/{1.73_m2} (ref >59)
GFR calc non Af Amer: 88 mL/min/{1.73_m2} (ref >59)
Globulin, Total: 2 g/dL (ref 1.5–4.5)
Glucose: 92 mg/dL (ref 65–99)
Potassium: 4.3 mmol/L (ref 3.5–5.2)
Sodium: 138 mmol/L (ref 134–144)
Total Protein: 6.1 g/dL (ref 6.0–8.5)

## 2020-02-09 NOTE — Progress Notes (Signed)
   Subjective:    Patient ID: Stacy Decker, female    DOB: October 27, 1950, 69 y.o.   MRN: [id number]  HPI Chief Complaint  Patient presents with  . med check    med check   She is here for medication management visit.  She is taking alendronate for osteoporosis and doing well with it.  Denies any side effects. No fever, chills, headache, chest pain, palpitations, cough, abdominal pain, nausea, vomiting, diarrhea.  States she has spider veins on her left ankle and is considering seeing someone about this.  States she has years of sun damage and several moles.  No other complaints.    Review of Systems Pertinent positives and negatives in the history of present illness.     Objective:   Physical Exam BP 122/80   Pulse 86   Wt 150 lb 9.6 oz (68.3 kg)   BMI 28.93 kg/m   Alert and oriented and in no acute distress.  Cardiac exam shows a regular sinus rhythm without murmurs or gallops. Lungs are clear to auscultation.  Extremities without edema.  Left lower ankle with spider veins.       Assessment & Plan:  Osteoporosis without current pathological fracture, unspecified osteoporosis type - Plan: CBC with Differential/Platelet, Comprehensive metabolic panel  Estrogen deficiency  Spider veins  Medication management - Plan: CBC with Differential/Platelet, Comprehensive metabolic panel  Multiple nevi  Needs flu shot  She is doing well on the alendronate and I recommend she continue for her osteoporosis.  She is getting adequate calcium and vitamin D.  Discussed weightbearing exercises. Discussed contacting dermatology for skin check due to numerous moles. She may also contact vein and vascular if she would like to have something done about her spider veins.

## 2020-02-09 NOTE — Patient Instructions (Signed)
Dermatology offices ? ?[facility]: Phone #: [phone] ?Address: [address], [place], [place] [address] ? ?[facility]: Phone: [phone] ? Address: [address], [place], [place] [address] ? ?[facility]: [phone] ?Address: [address] ?[place], [place] [address] ? ?[facility] ?Address: [address], [place], [place] [address] ?Phone: [phone]  ?

## 2020-02-25 ENCOUNTER — Encounter: Payer: Self-pay | Admitting: Family Medicine

## 2020-02-25 ENCOUNTER — Ambulatory Visit (INDEPENDENT_AMBULATORY_CARE_PROVIDER_SITE_OTHER): Payer: PPO | Admitting: Family Medicine

## 2020-02-25 ENCOUNTER — Other Ambulatory Visit: Payer: Self-pay

## 2020-02-25 VITALS — BP 132/80 | HR 63

## 2020-02-25 DIAGNOSIS — M545 Low back pain, unspecified: Secondary | ICD-10-CM

## 2020-02-25 MED ORDER — KETOROLAC TROMETHAMINE 30 MG/ML IJ SOLN
30.0000 mg | Freq: Once | INTRAMUSCULAR | Status: AC
  Administered 2020-02-25: 30 mg via INTRAMUSCULAR

## 2020-02-25 NOTE — Progress Notes (Signed)
° °  Subjective:    Patient ID: Stacy Decker, female    DOB: 08/18/1950, 69 y.o.   MRN: [id number]  HPI Chief Complaint  Patient presents with   Hip Pain    worse today-hard to walk- right side    Here today with complaints of right low back pain/ that was a mild ache 4-5 days ago. Now she is having severe pain. Non radiating. Pain is worse with movements such as taking a deep breath or position changes. No known injury.  Took 2 Aleve last night and this helped.  Tried heat and cold compresses.   Denies fever, chest pain, palpitations, shortness of breath, abdominal pain, N/V/D, urinary symptoms, LE edema. No numbness, tingling or weakness.     Pfizer booster yesterday  Chills and nausea this morning.    Review of Systems Pertinent positives and negatives in the history of present illness.     Objective:   Physical Exam BP 132/80    Pulse 63    SpO2 97%   Alert and in no distress. Cardiac exam shows a regular sinus rhythm without murmurs or gallops. Lungs are clear to auscultation. Abdomen is soft, non distended. Pain reproduced above her right iliac crest with movement. skin is normal appearing and non tender. Right hip with normal sensation and ROM in all planes including abduction, adduction, internal and external rotations. Normal strength of both hips. Bilateral LE are neurovascularly intact.       Assessment & Plan:  Acute right-sided low back pain without sciatica - Plan: ketorolac (TORADOL) 30 MG/ML injection 30 mg  No red flag symptoms.  Toradol injection IM given in office.  Discussed that her hip exam is normal and her pain seems to be more right lumbar and MSK in nature. Discussed conservative treatment including heat or ice, topical analgesics and OTC pain medications. Discussed option to XR and we will hold off. She will follow up if worsening or not back to baseline.

## 2020-02-25 NOTE — Patient Instructions (Signed)
We are giving you a shot of Toradol for pain today.  This is a strong anti-inflammatory.  If you need to take over-the-counter Aleve, ibuprofen then wait until bedtime tonight.  You can take 2 Aleve twice daily with food or 800 mg of ibuprofen 3 times per day with food.  You can also alternate Tylenol 500 -650 mg.  Use ice or heat on the area.  I also recommend getting an over-the-counter topical pain medication or patch such as Salonpas, Biofreeze, lidocaine patches.  Follow-up with me if you are getting worse or not improving by the first of next week

## 2020-02-27 ENCOUNTER — Encounter: Payer: Self-pay | Admitting: Family Medicine

## 2020-02-28 ENCOUNTER — Ambulatory Visit
Admission: RE | Admit: 2020-02-28 | Discharge: 2020-02-28 | Disposition: A | Discharge status: Home / Self Care | Payer: PPO | Source: Ambulatory Visit | Attending: Family Medicine | Admitting: Family Medicine

## 2020-02-28 ENCOUNTER — Telehealth: Payer: Self-pay

## 2020-02-28 DIAGNOSIS — M25559 Pain in unspecified hip: Secondary | ICD-10-CM

## 2020-02-28 DIAGNOSIS — M1611 Unilateral primary osteoarthritis, right hip: Secondary | ICD-10-CM | POA: Diagnosis not present

## 2020-02-28 DIAGNOSIS — M47816 Spondylosis without myelopathy or radiculopathy, lumbar region: Secondary | ICD-10-CM | POA: Diagnosis not present

## 2020-02-28 DIAGNOSIS — M545 Low back pain, unspecified: Secondary | ICD-10-CM

## 2020-02-28 NOTE — Telephone Encounter (Signed)
Let's send her for a lumbar spine Xray and right hip Xray for right low back and hip pain.

## 2020-02-28 NOTE — Telephone Encounter (Signed)
Put in orders

## 2020-02-28 NOTE — Telephone Encounter (Signed)
Pt. Called stating that she was seen Friday for sore hip, she said her rt. Upper hip pain is still bad she was hurting all weekend and tylenol is not helping. She wanted to know what she needs to do next or does she need to come back in.

## 2020-02-28 NOTE — Telephone Encounter (Signed)
Pt was notified of results

## 2020-02-29 NOTE — Progress Notes (Signed)
Please urgently refer her to [facility] for right low back pain and right hip pain.

## 2020-03-01 ENCOUNTER — Other Ambulatory Visit: Payer: Self-pay | Admitting: Internal Medicine

## 2020-03-01 DIAGNOSIS — F411 Generalized anxiety disorder: Secondary | ICD-10-CM | POA: Diagnosis not present

## 2020-03-01 DIAGNOSIS — F321 Major depressive disorder, single episode, moderate: Secondary | ICD-10-CM | POA: Diagnosis not present

## 2020-03-01 DIAGNOSIS — M545 Low back pain, unspecified: Secondary | ICD-10-CM

## 2020-03-01 DIAGNOSIS — M25559 Pain in unspecified hip: Secondary | ICD-10-CM

## 2020-03-08 ENCOUNTER — Ambulatory Visit (INDEPENDENT_AMBULATORY_CARE_PROVIDER_SITE_OTHER): Payer: PPO | Admitting: Orthopaedic Surgery

## 2020-03-08 DIAGNOSIS — M545 Low back pain, unspecified: Secondary | ICD-10-CM | POA: Diagnosis not present

## 2020-03-08 NOTE — Progress Notes (Signed)
Office Visit Note   Patient: Stacy Decker           Date of Birth: [birth year]           MRN: [id number] Visit Date: 03/08/2020              Requested by: Girtha Rm, NP-C [place],  [place] [id number] PCP: Girtha Rm, NP-C   Assessment & Plan: Visit Diagnoses:  1. Acute right-sided low back pain without sciatica     Plan: Impression is right-sided lower back pain/SI joint dysfunction.  The patient does not have significant symptoms at this time.  We have discussed starting medications and physical therapy, but she would like to try out at home exercise program first.  I provided her with this.  Should her symptoms worsen, she will call us and we will start her on medication and formal physical therapy.  Follow-up with [place] as needed otherwise.  Follow-Up Instructions: Return if symptoms worsen or fail to improve.   Orders:  No orders of the defined types were placed in this encounter.  No orders of the defined types were placed in this encounter.     Procedures: No procedures performed   Clinical Data: No additional findings.   Subjective: Chief Complaint  Patient presents with   Right Hip - Pain    HPI patient is a very pleasant 69 year old female who comes in today with right lateral hip/lower back pain.  This initially began approximately 2 to 3 weeks ago without any known injury or change in activity but it severely worsened about a week ago after receiving her Covid booster shot.  The pain is primarily to the right SI joint.  She describes this as a constant nagging at this point.  No specific aggravators.  She has not requiring any pain medication.  She denies any numbness, tingling or burning.  No weakness to either extremity.  No bowel or bladder dysfunction.  Review of Systems as detailed in HPI.  All other reviewed and are negative.   Objective: Vital Signs: There were no vitals taken for this visit.  Physical Exam  well-developed well-nourished female no acute distress.  Alert oriented x3.  Ortho Exam examination of her lumbar spine reveals no spinous or paraspinous tenderness.  She does have mild tenderness to the right SI joint.  Negative logroll negative straight leg raise.  No tenderness to the greater trochanter.  Negative logroll negative FADIR.  Specialty Comments:  No specialty comments available.  Imaging: X-rays reviewed by me in canopy of her lumbar spine and hip show marked degenerative changes throughout the entire lumbar spine with anterolisthesis L4-5 and L5-S1.  Right hip x-rays show mild to moderate degenerative changes.   PMFS History: Patient Active Problem List   Diagnosis Date Noted   Spider veins 02/09/2020   Obesity (BMI 30-39.9) 07/31/2018   Estrogen deficiency 07/31/2018   Prediabetes 05/29/2017   Anxiety and depression 05/29/2017   Screening for cervical cancer 05/29/2017   Medicare annual wellness visit, subsequent 05/29/2017   Osteoporosis without current pathological fracture 03/12/2016   Elevated hemoglobin A1c 01/30/2016   Depression 04/05/2015   Generalized anxiety disorder 04/05/2015   Past Medical History:  Diagnosis Date   Anxiety    Depression    History of vertigo    Osteoporosis    Substance abuse ([place])    recovering alcoholic    Family History  Problem Relation Age of Onset   Hypertension Mother  Cancer Mother        breast in remission age 71   Diabetes Mother    Diabetes Paternal Grandmother    Dementia Father        UTIs    Other Father        brain tumor   Other Sister        hip replacements   Alcohol abuse Sister    Other Sister        breast cysts- benign   Colon cancer Neg Hx    Stomach cancer Neg Hx    Rectal cancer Neg Hx    Esophageal cancer Neg Hx    Liver cancer Neg Hx    Breast cancer Neg Hx    BRCA 1/2 Neg Hx     Past Surgical History:  Procedure Laterality Date   AUGMENTATION  MAMMAPLASTY Bilateral    carpel tunnel     PLACEMENT OF BREAST IMPLANTS     Social History   Occupational History   Occupation: Therapy asst At [employer]  Tobacco Use   Smoking status: Former Smoker    Quit date: 06/28/1996    Years since quitting: 23.7   Smokeless tobacco: Never Used  Substance and Sexual Activity   Alcohol use: No    Alcohol/week: 0.0 standard drinks    Comment: recovering alcoholic quit in 4175   Drug use: No    Comment: former marijuana   Sexual activity: Not Currently    Birth control/protection: Post-menopausal

## 2020-03-14 ENCOUNTER — Other Ambulatory Visit: Payer: Self-pay | Admitting: Family Medicine

## 2020-03-14 DIAGNOSIS — M810 Age-related osteoporosis without current pathological fracture: Secondary | ICD-10-CM

## 2020-03-14 NOTE — Telephone Encounter (Signed)
Ok to refill 

## 2020-06-06 DIAGNOSIS — Z1152 Encounter for screening for COVID-19: Secondary | ICD-10-CM | POA: Diagnosis not present

## 2020-06-07 ENCOUNTER — Telehealth (INDEPENDENT_AMBULATORY_CARE_PROVIDER_SITE_OTHER): Payer: PPO | Admitting: Family Medicine

## 2020-06-07 ENCOUNTER — Encounter: Payer: Self-pay | Admitting: Family Medicine

## 2020-06-07 ENCOUNTER — Other Ambulatory Visit: Payer: Self-pay

## 2020-06-07 VITALS — Temp 98.9°F | Wt 150.0 lb

## 2020-06-07 DIAGNOSIS — R0981 Nasal congestion: Secondary | ICD-10-CM

## 2020-06-07 DIAGNOSIS — R5383 Other fatigue: Secondary | ICD-10-CM | POA: Diagnosis not present

## 2020-06-07 DIAGNOSIS — J3489 Other specified disorders of nose and nasal sinuses: Secondary | ICD-10-CM

## 2020-06-07 DIAGNOSIS — U071 COVID-19: Secondary | ICD-10-CM

## 2020-06-07 NOTE — Progress Notes (Signed)
   Subjective:  Documentation for virtual audio and video telecommunications through [place] encounter:  The patient was located at home. 2 patient identifiers used.  The provider was located in the office. The patient did consent to this visit and is aware of possible charges through their insurance for this visit.  The other persons participating in this telemedicine service were none. Time spent on call was 16 minutes and in review of previous records >20 minutes total.  This virtual service is not related to other E/M service within previous 7 days.   Patient ID: Stacy Decker, female    DOB: 1951-03-14, 70 y.o.   MRN: [id number]  HPI Chief Complaint  Patient presents with  . Covid Positive    Home test had  a part of line [place] [facility] [place] no results as of today   . Nasal Congestion  . Sore Throat    Has improved   . Cough    Started Sunday  and has had all three covid vaccine. [place]  . Fatigue    Started Sunday    Complains of a 4 day history of fatigue, nasal congestion, rhinorrhea, sore throat, and cough.  States sore throat and cough have improved.  States she feels like she is improving but just has a bad cold. She is at home and quarantine.  Taking Coricidin.  No fever, chills, headache, dizziness, chest pain, palpitations, shortness of breath, abdominal pain, nausea, vomiting or diarrhea. States she did a home test and it was positive.   States she also went to get another test yesterday to confirm it and she has not heard back yet.   Review of Systems Pertinent positives and negatives in the history of present illness.     Objective:   Physical Exam Temp 98.9 F (37.2 C)   Wt 150 lb (68 kg)   BMI 28.81 kg/m   Alert and oriented in no acute distress.  Respirations unlabored.  She is speaking in complete sentences without difficulty.      Assessment & Plan:  COVID-19 virus infection  Fatigue, unspecified type  Nasal  congestion with rhinorrhea  Discussed that her positive home COVID test and her symptoms being that she does have a COVID infection and she does not really need another test. Discussed supportive care.  She will continue to quarantine until day 10 from onset of symptoms.  If she is worsening she will follow-up or if she needs return to work note she may also follow-up.

## 2020-06-16 ENCOUNTER — Other Ambulatory Visit: Payer: Self-pay | Admitting: Family Medicine

## 2020-06-16 DIAGNOSIS — M810 Age-related osteoporosis without current pathological fracture: Secondary | ICD-10-CM

## 2020-06-19 ENCOUNTER — Other Ambulatory Visit: Payer: Self-pay | Admitting: Family Medicine

## 2020-06-19 DIAGNOSIS — Z1231 Encounter for screening mammogram for malignant neoplasm of breast: Secondary | ICD-10-CM

## 2020-08-02 ENCOUNTER — Ambulatory Visit
Admission: RE | Admit: 2020-08-02 | Discharge: 2020-08-02 | Disposition: A | Discharge status: Home / Self Care | Payer: PPO | Source: Ambulatory Visit | Attending: Family Medicine | Admitting: Family Medicine

## 2020-08-02 ENCOUNTER — Other Ambulatory Visit: Payer: Self-pay

## 2020-08-02 DIAGNOSIS — Z1231 Encounter for screening mammogram for malignant neoplasm of breast: Secondary | ICD-10-CM

## 2020-08-09 ENCOUNTER — Ambulatory Visit: Payer: PPO | Admitting: Family Medicine

## 2020-08-25 DIAGNOSIS — F411 Generalized anxiety disorder: Secondary | ICD-10-CM | POA: Diagnosis not present

## 2020-08-25 DIAGNOSIS — F321 Major depressive disorder, single episode, moderate: Secondary | ICD-10-CM | POA: Diagnosis not present

## 2020-09-03 ENCOUNTER — Other Ambulatory Visit: Payer: Self-pay | Admitting: Family Medicine

## 2020-09-03 DIAGNOSIS — M810 Age-related osteoporosis without current pathological fracture: Secondary | ICD-10-CM

## 2020-09-20 NOTE — Progress Notes (Addendum)
Stacy Decker is a 70 y.o. female who presents for annual wellness visit and follow-up on chronic medical conditions.  She has the following concerns:  Right upper arm pain for the past 6-8 months.  States she cannot sleep on her right side. No pain any other time.  No  Numbness, tingling or weakness.   She has chronic left ankle pain and swelling.    Immunization History  Administered Date(s) Administered  . Fluad Quad(high Dose 65+) 02/09/2020  . Influenza, High Dose Seasonal PF 01/30/2016, 03/09/2018  . Influenza-Unspecified 03/10/2012, 02/12/2013, 03/11/2015, 02/28/2017  . PFIZER(Purple Top)SARS-COV-2 Vaccination 07/10/2019, 08/02/2019, 02/07/2020  . Pneumococcal Conjugate-13 10/11/2014  . Pneumococcal Polysaccharide-23 02/19/2016  . Tdap 09/10/2013  . Zoster 03/10/2012   Last Pap smear: 2019 Last mammogram: 07/2020 Last colonoscopy: 2018 and due for recall in 2028  Last DEXA: 2020 Dentist: Dr. Johnn Hai DDS Ophtho: Dr. Marica Otter Exercise: not much  Other doctors caring for patient include: Dentist- Dr. Greer Ee GI- Dr. Ardis Hughs  Psychiatrist- Dr. Darleene Cleaver and Stephannie Peters NP Therapist- every 2 weeks with [facility] in [place]- Dr. Sabra Heck  Dr. Karie Soda- Dr. Erlinda Hong   Depression screen:  See questionnaire below.  Depression screen [facility] 2/9 09/21/2020 02/09/2020 08/09/2019 07/31/2018 05/29/2017  Decreased Interest 0 0 3 0 0  Down, Depressed, Hopeless 0 0 3 0 0  PHQ - 2 Score 0 0 6 0 0  Altered sleeping 0 - 3 - -  Tired, decreased energy 0 - 3 - -  Change in appetite 0 - 0 - -  Feeling bad or failure about yourself  0 - 1 - -  Trouble concentrating 0 - 0 - -  Moving slowly or fidgety/restless 0 - 0 - -  Suicidal thoughts 0 - 0 - -  PHQ-9 Score 0 - 13 - -  Difficult doing work/chores Not difficult at all - Not difficult at all - -    Fall Risk Screen: see questionnaire below. Fall Risk  09/21/2020 02/09/2020 08/09/2019 07/31/2018 05/29/2017  Falls in the  past year? 0 0 1 1 Yes  Comment - - - - -  Number falls in past yr: 0 0 1 1 1   Comment - - - - -  Injury with Fall? 0 0 1 1 No  Risk for fall due to : No Fall Risks - - - Other (Comment)  Risk for fall due to: Comment - - - - tripped  Follow up Falls evaluation completed - - - Falls evaluation completed;Falls prevention discussed    ADL screen:  See questionnaire below Functional Status Survey: Is the patient deaf or have difficulty hearing?: No Does the patient have difficulty seeing, even when wearing glasses/contacts?: No Does the patient have difficulty concentrating, remembering, or making decisions?: No Does the patient have difficulty walking or climbing stairs?: Yes (ankle issues) Does the patient have difficulty dressing or bathing?: No Does the patient have difficulty doing errands alone such as visiting a doctor's office or shopping?: No   End of Life Discussion:  Patient has a living will and medical power of attorney. MOST form reviewed.   Review of Systems Constitutional: -fever, -chills, -sweats, -unexpected weight change, -anorexia, -fatigue Allergy: -sneezing, -itching, -congestion Dermatology: denies changing moles, rash, lumps, new worrisome lesions ENT: -runny nose, -ear pain, -sore throat, -hoarseness, -sinus pain, -teeth pain, -tinnitus, -hearing loss, -epistaxis Cardiology:  -chest pain, -palpitations, -edema, -orthopnea, -paroxysmal nocturnal dyspnea Respiratory: -cough, -shortness of breath, -dyspnea on exertion, -wheezing, -hemoptysis  Gastroenterology: -abdominal pain, -nausea, -vomiting, -diarrhea, -constipation, -blood in stool, -changes in bowel movement, -dysphagia Hematology: -bleeding or bruising problems Musculoskeletal: -arthralgias, -myalgias, -joint swelling, -back pain, -neck pain, -cramping, -gait changes Ophthalmology: -vision changes, -eye redness, -itching, -discharge Urology: -dysuria, -difficulty urinating, -hematuria, -urinary frequency,  -urgency, incontinence Neurology: -headache, -weakness, -tingling, -numbness, -speech abnormality, -memory loss, -falls, -dizziness Psychology:  -depressed mood, -agitation, -sleep problems    PHYSICAL EXAM:  BP 124/80   Pulse 79   Ht 5' (1.524 m)   Wt 146 lb 3.2 oz (66.3 kg)   BMI 28.55 kg/m   General Appearance: Alert, cooperative, no distress, appears stated age Head: Normocephalic, without obvious abnormality, atraumatic Eyes: PERRL, conjunctiva/corneas clear, EOM's intact, fundi benign Ears: Normal TM's and external ear canals Nose: mask on  Throat: Lips, mucosa, and tongue normal; teeth and gums normal Neck: Supple, no lymphadenopathy; thyroid: no enlargement/tenderness/nodules; no carotid bruit or JVD Back: Spine nontender, no curvature, ROM normal, no CVA tenderness Lungs: Clear to auscultation bilaterally without wheezes, rales or ronchi; respirations unlabored Chest Wall: No tenderness or deformity Heart: Regular rate and rhythm, S1 and S2 normal, no murmur, rub or gallop Breast Exam: declines  Abdomen: Soft, non-tender, nondistended, normoactive bowel sounds, no masses, no hepatosplenomegaly Genitalia: declines  Extremities: No clubbing or cyanosis. Left ankle with edema, non tender, normal ROM and strength.  Pulses: 2+ and symmetric all extremities Skin: Skin color, texture, turgor normal, no rashes or lesions Lymph nodes: Cervical, supraclavicular, and axillary nodes normal Neurologic: CNII-XII intact, normal strength, sensation and gait Psych: Normal mood, affect, hygiene and grooming.  ASSESSMENT/PLAN: Medicare annual wellness visit, subsequent  Routine general medical examination at a health care facility - Plan: CBC with Differential/Platelet, Comprehensive metabolic panel, TSH, Lipid panel  Age-related osteoporosis without current pathological fracture - Plan: DG Bone Density, DG TMJ Open & Close Unilateral Right  Anxiety and depression  Estrogen  deficiency - Plan: DG Bone Density  Chronic pain of left ankle  Screening for lipid disorders - Plan: Lipid panel  Screening for thyroid disorder - Plan: TSH  TMJ crepitus - Plan: DG TMJ Open & Close Unilateral Right  Advance directive discussed with patient     Discussed monthly self breast exams and yearly mammograms; at least 30 minutes of aerobic activity at least 5 days/week and weight-bearing exercise 2x/week; proper sunscreen use reviewed; healthy diet, including goals of calcium and vitamin D intake and alcohol recommendations (less than or equal to 1 drink/day) reviewed; regular seatbelt use; changing batteries in smoke detectors.  Immunization recommendations discussed.  Colonoscopy recommendations reviewed   Medicare Attestation I have personally reviewed: The patient's medical and social history Their use of alcohol, tobacco or illicit drugs Their current medications and supplements The patient's functional ability including ADLs,fall risks, home safety risks, cognitive, and hearing and visual impairment Diet and physical activities Evidence for depression or mood disorders  The patient's weight, height, and BMI have been recorded in the chart.  I have made referrals, counseling, and provided education to the patient based on review of the above and I have provided the patient with a written personalized care plan for preventive services.     Harland Dingwall, NP-C   09/21/2020

## 2020-09-21 ENCOUNTER — Encounter: Payer: Self-pay | Admitting: Family Medicine

## 2020-09-21 ENCOUNTER — Other Ambulatory Visit: Payer: Self-pay

## 2020-09-21 ENCOUNTER — Ambulatory Visit (INDEPENDENT_AMBULATORY_CARE_PROVIDER_SITE_OTHER): Payer: PPO | Admitting: Family Medicine

## 2020-09-21 ENCOUNTER — Ambulatory Visit
Admission: RE | Admit: 2020-09-21 | Discharge: 2020-09-21 | Disposition: A | Discharge status: Home / Self Care | Payer: PPO | Source: Ambulatory Visit | Attending: Family Medicine | Admitting: Family Medicine

## 2020-09-21 VITALS — BP 124/80 | HR 79 | Ht 60.0 in | Wt 146.2 lb

## 2020-09-21 DIAGNOSIS — Z7189 Other specified counseling: Secondary | ICD-10-CM

## 2020-09-21 DIAGNOSIS — Z0000 Encounter for general adult medical examination without abnormal findings: Secondary | ICD-10-CM | POA: Diagnosis not present

## 2020-09-21 DIAGNOSIS — Z13220 Encounter for screening for lipoid disorders: Secondary | ICD-10-CM

## 2020-09-21 DIAGNOSIS — M810 Age-related osteoporosis without current pathological fracture: Secondary | ICD-10-CM | POA: Diagnosis not present

## 2020-09-21 DIAGNOSIS — F419 Anxiety disorder, unspecified: Secondary | ICD-10-CM

## 2020-09-21 DIAGNOSIS — Z1329 Encounter for screening for other suspected endocrine disorder: Secondary | ICD-10-CM

## 2020-09-21 DIAGNOSIS — M2669 Other specified disorders of temporomandibular joint: Secondary | ICD-10-CM

## 2020-09-21 DIAGNOSIS — M25572 Pain in left ankle and joints of left foot: Secondary | ICD-10-CM

## 2020-09-21 DIAGNOSIS — E2839 Other primary ovarian failure: Secondary | ICD-10-CM | POA: Diagnosis not present

## 2020-09-21 DIAGNOSIS — F32A Depression, unspecified: Secondary | ICD-10-CM

## 2020-09-21 DIAGNOSIS — G8929 Other chronic pain: Secondary | ICD-10-CM | POA: Diagnosis not present

## 2020-09-21 NOTE — Patient Instructions (Addendum)
  Stacy Decker , Thank you for taking time to come for your Medicare Wellness Visit. I appreciate your ongoing commitment to your health goals. Please review the following plan we discussed and let me know if I can assist you in the future.   These are the goals we discussed:  Call and schedule with Dr. Erlinda Hong at [place].   Call and schedule a bone density at [facility].   Go to [place] for the X ray of your right jaw and schedule with your dentist.    This is a list of the screening recommended for you and due dates:  Health Maintenance  Topic Date Due  . Flu Shot  12/25/2020  . Mammogram  08/03/2022  . Tetanus Vaccine  09/11/2023  . Colon Cancer Screening  07/30/2026  . DEXA scan (bone density measurement)  Completed  . COVID-19 Vaccine  Completed  .  Hepatitis C: One time screening is recommended by Center for Disease Control  (CDC) for  adults born from [birth year] through 1966.   Completed  . Pneumonia vaccines  Completed  . HPV Vaccine  Aged Out

## 2020-09-21 NOTE — Addendum Note (Signed)
Addended by: Girtha Rm on: 09/21/2020 03:49 PM   Modules accepted: Level of Service

## 2020-09-22 LAB — CBC WITH DIFFERENTIAL/PLATELET
Basophils Absolute: 0.1 10*3/uL (ref 0.0–0.2)
Basos: 1 %
EOS (ABSOLUTE): 0.3 10*3/uL (ref 0.0–0.4)
Eos: 3 %
Hematocrit: 41.8 % (ref 34.0–46.6)
Hemoglobin: 14.5 g/dL (ref 11.1–15.9)
Immature Grans (Abs): 0 10*3/uL (ref 0.0–0.1)
Immature Granulocytes: 0 %
Lymphocytes Absolute: 1.8 10*3/uL (ref 0.7–3.1)
Lymphs: 21 %
MCH: 30.8 pg (ref 26.6–33.0)
MCHC: 34.7 g/dL (ref 31.5–35.7)
MCV: 89 fL (ref 79–97)
Monocytes Absolute: 0.9 10*3/uL (ref 0.1–0.9)
Monocytes: 10 %
Neutrophils Absolute: 5.5 10*3/uL (ref 1.4–7.0)
Neutrophils: 65 %
Platelets: 313 10*3/uL (ref 150–450)
RBC: 4.71 x10E6/uL (ref 3.77–5.28)
RDW: 12.2 % (ref 11.7–15.4)
WBC: 8.6 10*3/uL (ref 3.4–10.8)

## 2020-09-22 LAB — COMPREHENSIVE METABOLIC PANEL
ALT: 11 IU/L (ref 0–32)
AST: 23 IU/L (ref 0–40)
Albumin/Globulin Ratio: 2 (ref 1.2–2.2)
Albumin: 4.3 g/dL (ref 3.7–4.7)
Alkaline Phosphatase: 96 IU/L (ref 44–121)
BUN/Creatinine Ratio: 17 (ref 12–28)
BUN: 11 mg/dL (ref 8–27)
Bilirubin Total: 0.4 mg/dL (ref 0.0–1.2)
CO2: 23 mmol/L (ref 20–29)
Calcium: 9.6 mg/dL (ref 8.7–10.3)
Chloride: 100 mmol/L (ref 96–106)
Creatinine, Ser: 0.66 mg/dL (ref 0.57–1.00)
Globulin, Total: 2.2 g/dL (ref 1.5–4.5)
Glucose: 82 mg/dL (ref 65–99)
Potassium: 4.6 mmol/L (ref 3.5–5.2)
Sodium: 137 mmol/L (ref 134–144)
Total Protein: 6.5 g/dL (ref 6.0–8.5)
eGFR: 94 mL/min/{1.73_m2} (ref >59)

## 2020-09-22 LAB — LIPID PANEL
Chol/HDL Ratio: 3.7 ratio (ref 0.0–4.4)
Cholesterol, Total: 178 mg/dL (ref 100–199)
HDL: 48 mg/dL (ref >39)
LDL Chol Calc (NIH): 112 mg/dL — ABNORMAL HIGH (ref 0–99)
Triglycerides: 99 mg/dL (ref 0–149)
VLDL Cholesterol Cal: 18 mg/dL (ref 5–40)

## 2020-09-22 LAB — TSH: TSH: 2.54 u[IU]/mL (ref 0.450–4.500)

## 2020-10-03 ENCOUNTER — Encounter: Payer: Self-pay | Admitting: Family Medicine

## 2020-10-04 ENCOUNTER — Telehealth: Payer: Self-pay | Admitting: Internal Medicine

## 2020-10-04 NOTE — Telephone Encounter (Signed)
Needs a visit for me to order the XR and make sure we are doing what is needed for her. If we do not have an opening then please check with other providers. She can also go to the after hours orthopedic clinic if she needs to be seen sooner.

## 2020-10-04 NOTE — Telephone Encounter (Signed)
Pt called stating that her left foot is bothering her and she would like to get an xray. She can't hardly want on it and she had a boot that she has put on to help. She has not scheduled an appt yet but like to get xray.

## 2020-10-04 NOTE — Telephone Encounter (Signed)
Pt scheduled for foot pain with Dr. Redmond School tomororw as [facility] doesn't have any openings

## 2020-10-05 ENCOUNTER — Encounter: Payer: Self-pay | Admitting: Family Medicine

## 2020-10-05 ENCOUNTER — Ambulatory Visit (INDEPENDENT_AMBULATORY_CARE_PROVIDER_SITE_OTHER): Payer: PPO | Admitting: Family Medicine

## 2020-10-05 VITALS — BP 114/72 | HR 84 | Temp 98.7°F | Ht 61.0 in | Wt 149.4 lb

## 2020-10-05 DIAGNOSIS — S86002A Unspecified injury of left Achilles tendon, initial encounter: Secondary | ICD-10-CM | POA: Diagnosis not present

## 2020-10-05 NOTE — Patient Instructions (Signed)
You can take up to 800 mg 3 times per day.  Wear the boot for the next week or so.  As you start to feel better use the boot less and less and eventually get out of

## 2020-10-05 NOTE — Progress Notes (Signed)
   Subjective:    Patient ID: Stacy Decker, female    DOB: 1951-01-23, 70 y.o.   MRN: [id number]  HPI She injured her left Achilles tendon when an object fell directly on that and since then has been unable to walk without pain.  She is now using a boot that she had from previous foot surgery.   Review of Systems     Objective:   Physical Exam Exam of the tendon shows no defect, swelling or palpable tenderness.  Full motion of the ankle.       Assessment & Plan:  Injury of left Achilles tendon, initial encounter You can take up to 800 mg 3 times per day.  Wear the boot for the next week or so.  As you start to feel better use the boot less and less and eventually get out of

## 2020-10-09 ENCOUNTER — Encounter: Payer: Self-pay | Admitting: Family Medicine

## 2020-10-25 ENCOUNTER — Other Ambulatory Visit: Payer: Self-pay | Admitting: Family Medicine

## 2020-10-25 DIAGNOSIS — M810 Age-related osteoporosis without current pathological fracture: Secondary | ICD-10-CM

## 2020-10-25 DIAGNOSIS — E2839 Other primary ovarian failure: Secondary | ICD-10-CM

## 2020-11-10 ENCOUNTER — Other Ambulatory Visit: Payer: Self-pay | Admitting: Family Medicine

## 2020-11-10 DIAGNOSIS — M810 Age-related osteoporosis without current pathological fracture: Secondary | ICD-10-CM

## 2020-12-01 ENCOUNTER — Ambulatory Visit: Payer: PPO | Admitting: Podiatry

## 2020-12-01 ENCOUNTER — Other Ambulatory Visit: Payer: Self-pay

## 2020-12-01 ENCOUNTER — Ambulatory Visit (INDEPENDENT_AMBULATORY_CARE_PROVIDER_SITE_OTHER): Payer: PPO

## 2020-12-01 DIAGNOSIS — M7752 Other enthesopathy of left foot: Secondary | ICD-10-CM | POA: Diagnosis not present

## 2020-12-01 DIAGNOSIS — S99912A Unspecified injury of left ankle, initial encounter: Secondary | ICD-10-CM | POA: Diagnosis not present

## 2020-12-01 MED ORDER — TRIAMCINOLONE ACETONIDE 10 MG/ML IJ SUSP
10.0000 mg | Freq: Once | INTRAMUSCULAR | Status: AC
  Administered 2020-12-01: 10 mg

## 2020-12-01 NOTE — Progress Notes (Signed)
Subjective:   Patient ID: Stacy Decker, female   DOB: 70 y.o.   MRN: [id number]   HPI Patient presents stating she has had severe pain in her left ankle and she is interested in surgery if possible.  She has had AFO bracing orthotics and other treatments and states that her foot and ankle feels unstable and she is hopeful that something can be done permanently to help her condition.  Patient does not smoke and is not currently active   Review of Systems  All other systems reviewed and are negative.      Objective:  Physical Exam Vitals and nursing note reviewed.  Constitutional:      Appearance: She is well-developed.  Pulmonary:     Effort: Pulmonary effort is normal.  Musculoskeletal:        General: Normal range of motion.  Skin:    General: Skin is warm.  Neurological:     Mental Status: She is alert.    Neurovascular status intact muscle strength found to be adequate range of motion adequate.  Patient does have complete collapse medial longitudinal arch left with severe arthritis and probable dysfunction completely of the posterior tibial tendon.  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  Patient is interested in surgical intervention I did discuss the only surgery that would be of possible improvement would be probable subtalar joint and probable midtarsal joint fusion.  She is having a lot of pain in the sinus tarsi went ahead and I did sterile injection 3 mg Dexasone Kenalog 5 mg Xylocaine to try to reduce the short-term inflammation and I am good to have her see Dr.  Sherryle Lis for consideration for fusion surgery.  Patient encouraged to call questions concerns  X-rays indicate complete collapse medial longitudinal arch left with probability for significant arthritis subtalar joint midtarsal joint left with ankle which may be involved but does appear to be spared at this time

## 2020-12-12 ENCOUNTER — Encounter: Payer: Self-pay | Admitting: Podiatry

## 2020-12-26 ENCOUNTER — Ambulatory Visit (INDEPENDENT_AMBULATORY_CARE_PROVIDER_SITE_OTHER): Payer: PPO

## 2020-12-26 ENCOUNTER — Other Ambulatory Visit: Payer: Self-pay

## 2020-12-26 ENCOUNTER — Ambulatory Visit: Payer: PPO | Admitting: Podiatry

## 2020-12-26 DIAGNOSIS — S99912A Unspecified injury of left ankle, initial encounter: Secondary | ICD-10-CM | POA: Diagnosis not present

## 2020-12-26 DIAGNOSIS — M76822 Posterior tibial tendinitis, left leg: Secondary | ICD-10-CM

## 2020-12-26 DIAGNOSIS — M21862 Other specified acquired deformities of left lower leg: Secondary | ICD-10-CM

## 2020-12-26 DIAGNOSIS — M216X2 Other acquired deformities of left foot: Secondary | ICD-10-CM | POA: Diagnosis not present

## 2020-12-26 DIAGNOSIS — M2142 Flat foot [pes planus] (acquired), left foot: Secondary | ICD-10-CM | POA: Diagnosis not present

## 2020-12-31 NOTE — Progress Notes (Signed)
  Subjective:  Patient ID: Stacy Decker, female    DOB: 1950-12-02,  MRN: [id number]  Chief Complaint  Patient presents with   Follow-up    3-4 follow up / pt is interested in fusion surgery     70 y.o. female presents with the above complaint. History confirmed with patient.  She is referred to me by Dr. Paulla Dolly she has had severe flatfoot deformity for many years and has been worsening over the last several years and become quite painful.  She has tried an AFO bracing and orthotics for this.  So far has not been able to find a nonsurgical solution.  Is interested in permanent surgical solution  Objective:  Physical Exam: warm, good capillary refill, no trophic changes or ulcerative lesions, normal DP and PT pulses, and normal sensory exam. Left Foot: Severe pes planus deformity she has gastrocnemius equinus and pain of the posterior tibial tendon and sinus tarsi with calcaneal valgus on weightbearing  Radiographs: Multiple views x-ray of left foot and ankle, new calcaneal alignment view taken today previous radiographs also reviewed: She has severe pes planovalgus deformity with calcaneal valgus as well, ankle congruent Assessment:   1. Acquired pes planus, left   2. Posterior tibial tendon dysfunction, left   3. Gastrocnemius equinus of left lower extremity      Plan:  Patient was evaluated and treated and all questions answered.  We discussed her progress so far including her nonsurgical treatment.  We discussed permanent surgical correction with reconstructive hindfoot surgery.  I think given the severity of her deformity she likely would require at least a double arthrodesis.  I am ordering MRI to evaluate the integrity of the posterior tibial tendon as well as the deltoid ligament on the inside of the ankle to plan for surgical reconstruction.  She will return after the MRI for further discussion and planning.  We discussed the surgical procedure including the postoperative  course and rationale for the procedure.  We will discuss this further at her next visit as well  Return in about 1 month (around 01/26/2021) for after MRI to review.

## 2021-01-18 ENCOUNTER — Telehealth: Payer: Self-pay | Admitting: Podiatry

## 2021-01-18 NOTE — Telephone Encounter (Signed)
Patient called today wanting to know when she would be schedule for her MRI. She states know one has called her. Please advise

## 2021-01-19 NOTE — Addendum Note (Signed)
Addended bySherryle Lis, Furqan Gosselin R on: 01/19/2021 07:52 AM   Modules accepted: Orders

## 2021-01-22 ENCOUNTER — Encounter: Payer: Self-pay | Admitting: Podiatry

## 2021-01-23 ENCOUNTER — Encounter: Payer: Self-pay | Admitting: Family Medicine

## 2021-01-23 ENCOUNTER — Ambulatory Visit (INDEPENDENT_AMBULATORY_CARE_PROVIDER_SITE_OTHER): Payer: PPO | Admitting: Family Medicine

## 2021-01-23 ENCOUNTER — Other Ambulatory Visit: Payer: Self-pay

## 2021-01-23 VITALS — BP 128/80 | HR 78 | Temp 98.1°F | Wt 147.0 lb

## 2021-01-23 DIAGNOSIS — N3001 Acute cystitis with hematuria: Secondary | ICD-10-CM | POA: Diagnosis not present

## 2021-01-23 DIAGNOSIS — R32 Unspecified urinary incontinence: Secondary | ICD-10-CM

## 2021-01-23 LAB — POCT URINALYSIS DIP (CLINITEK)
Bilirubin, UA: NEGATIVE
Glucose, UA: NEGATIVE mg/dL
Ketones, POC UA: NEGATIVE mg/dL
Nitrite, UA: NEGATIVE
Spec Grav, UA: 1.015 (ref 1.010–1.025)
Urobilinogen, UA: 0.2 E.U./dL
pH, UA: 6.5 (ref 5.0–8.0)

## 2021-01-23 MED ORDER — SULFAMETHOXAZOLE-TRIMETHOPRIM 800-160 MG PO TABS: 1.0000 | ORAL_TABLET | Freq: Two times a day (BID) | ORAL | 0 refills | Status: DC

## 2021-01-23 NOTE — Patient Instructions (Signed)
Take the antibiotic as prescribed.  Increase your water intake during the day.  Cut back on your caffeine intake.  I also recommend stopping your fluids earlier in the day. Try to double void (urinate) before you go to bed. Listen to your body and if you have the urge to urinate, stop what you are doing.  If her symptoms are worsening or are not improved after completing the antibiotic, let me know.  We will need to recheck your urine at your follow-up visit since you did have blood in your urine today.    Urinary Incontinence  Urinary incontinence refers to a condition in which a person is unable to control where and when to pass urine. A person with this condition will urinate when he or she does not mean to (involuntarily). What are the causes? This condition may be caused by: Medicines. Infections. Constipation. Overactive bladder muscles. Weak bladder muscles. Weak pelvic floor muscles. These muscles provide support for the bladder, intestine, and, in women, the uterus. Enlarged prostate in men. The prostate is a gland near the bladder. When it gets too big, it can pinch the urethra. With the urethra blocked, the bladder can weaken and lose the ability to empty properly. Surgery. Emotional factors, such as anxiety, stress, or post-traumatic stress disorder (PTSD). Pelvic organ prolapse. This happens in women when organs shift out of place and into the vagina. This shift can prevent the bladder and urethra from working properly. What increases the risk? The following factors may make you more likely to develop this condition: Older age. Obesity and physical inactivity. Pregnancy and childbirth. Menopause. Diseases that affect the nerves or spinal cord (neurological diseases). Long-term (chronic) coughing. This can increase pressure on the bladder and pelvic floor muscles. What are the signs or symptoms? Symptoms may vary depending on the type of urinary incontinence you have.  They include: A sudden urge to urinate, but passing urine involuntarily before you can get to a bathroom (urge incontinence). Suddenly passing urine with any activity that forces urine to pass, such as coughing, laughing, exercise, or sneezing (stress incontinence). Needing to urinate often, but urinating only a small amount, or constantly dribbling urine (overflow incontinence). Urinating because you cannot get to the bathroom in time due to a physical disability, such as arthritis or injury, or communication and thinking problems, such as Alzheimer disease (functional incontinence). How is this diagnosed? This condition may be diagnosed based on: Your medical history. A physical exam. Tests, such as: Urine tests. X-rays of your kidney and bladder. Ultrasound. CT scan. Cystoscopy. In this procedure, a health care provider inserts a tube with a light and camera (cystoscope) through the urethra and into the bladder in order to check for problems. Urodynamic testing. These tests assess how well the bladder, urethra, and sphincter can store and release urine. There are different types of urodynamic tests, and they vary depending on what the test is measuring. To help diagnose your condition, your health care provider may recommend thatyou keep a log of when you urinate and how much you urinate. How is this treated? Treatment for this condition depends on the type of incontinence that you have and its cause. Treatment may include: Lifestyle changes, such as: Quitting smoking. Maintaining a healthy weight. Staying active. Try to get 150 minutes of moderate-intensity exercise every week. Ask your health care provider which activities are safe for you. Eating a healthy diet. Avoid high-fat foods, like fried foods. Avoid refined carbohydrates like white bread and white  rice. Limit how much alcohol and caffeine you drink. Increase your fiber intake. Foods such as fresh fruits, vegetables, beans,  and whole grains are healthy sources of fiber. Pelvic floor muscle exercises. Bladder training, such as lengthening the amount of time between bathroom breaks, or using the bathroom at regular intervals. Using techniques to suppress bladder urges. This can include distraction techniques or controlled breathing exercises. Medicines to relax the bladder muscles and prevent bladder spasms. Medicines to help slow or prevent the growth of a man's prostate. Botox injections. These can help relax the bladder muscles. Using pulses of electricity to help change bladder reflexes (electrical nerve stimulation). For women, using a medical device to prevent urine leaks. This is a small, tampon-like, disposable device that is inserted into the urethra. Injecting collagen or carbon beads (bulking agents) into the urinary sphincter. These can help thicken tissue and close the bladder opening. Surgery. Follow these instructions at home: Lifestyle Limit alcohol and caffeine. These can fill your bladder quickly and irritate it. Keep yourself clean to help prevent odors and skin damage. Ask your doctor about special skin creams and cleansers that can protect the skin from urine. Consider wearing pads or adult diapers. Make sure to change them regularly, and always change them right after experiencing incontinence. General instructions Take over-the-counter and prescription medicines only as told by your health care provider. Use the bathroom about every 3-4 hours, even if you do not feel the need to urinate. Try to empty your bladder completely every time. After urinating, wait a minute. Then try to urinate again. Make sure you are in a relaxed position while urinating. If your incontinence is caused by nerve problems, keep a log of the medicines you take and the times you go to the bathroom. Keep all follow-up visits as told by your health care provider. This is important. Contact a health care provider if: You  have pain that gets worse. Your incontinence gets worse. Get help right away if: You have a fever or chills. You are unable to urinate. You have redness in your groin area or down your legs. Summary Urinary incontinence refers to a condition in which a person is unable to control where and when to pass urine. This condition may be caused by medicines, infection, weak bladder muscles, weak pelvic floor muscles, enlargement of the prostate (in men), or surgery. The following factors increase your risk for developing this condition: older age, obesity, pregnancy and childbirth, menopause, neurological diseases, and chronic coughing. There are several types of urinary incontinence. They include urge incontinence, stress incontinence, overflow incontinence, and functional incontinence. This condition is usually treated first with lifestyle and behavioral changes, such as quitting smoking, eating a healthier diet, and doing regular pelvic floor exercises. Other treatment options include medicines, bulking agents, medical devices, electrical nerve stimulation, or surgery. This information is not intended to replace advice given to you by your health care provider. Make sure you discuss any questions you have with your healthcare provider. Document Revised: 05/23/2017 Document Reviewed: 08/22/2016 Elsevier Patient Education  [employer].

## 2021-01-23 NOTE — Progress Notes (Signed)
   Subjective:    Patient ID: Stacy Decker, female    DOB: [birth year], 70 y.o.   MRN: [id number]  HPI Chief Complaint  Patient presents with   other    Incontinence issues  been going on last two weeks got a lot worse, has to hurry to the bathroom.    She is here with complaints of urinary leakage when getting up at night or waiting too long during the day to empty her bladder. States she often does not go to the bathroom when she feels the urge to urinate and states this has been a lifelong habit.   Stress incontinence "at times". Denies urinary frequency, dysuria or odor.   Drinks coffee, tea even at night up until bed time.   Going to [place] still. No alcohol.   New boyfriend but no sexual "penetration" yet.   Denies fever, chills, dizziness, chest pain, palpitations, shortness of breath, abdominal pain, N/V/D.   Reviewed allergies, medications, past medical, surgical, family, and social history.   Review of Systems Pertinent positives and negatives in the history of present illness.     Objective:   Physical Exam BP 128/80   Pulse 78   Temp 98.1 F (36.7 C)   Wt 147 lb (66.7 kg)   BMI 27.78 kg/m   Alert and in no distress. Cardiac exam shows a regular sinus rhythm without murmurs or gallops. Lungs are clear to auscultation. Abdomen soft, non distended, non tender, normal BS, no rebound or guarding. No CVAT.   UA shows Leuks, RBCs    Assessment & Plan:  Acute cystitis with hematuria - Plan: Urine Culture, sulfamethoxazole-trimethoprim (BACTRIM DS) 800-160 MG tablet  Urinary incontinence, unspecified type - Plan: POCT URINALYSIS DIP (CLINITEK), Urine Culture  Urine culture sent.  Antibiotic prescribed. Increase water intake. Reduce caffeine intake and stop fluids earlier in the evening. Double void before bedtime. Urinate when urge presents. Follow up for recheck of urine due to hematuria and she is aware and will return for CPE next month or sooner if worsening  or not improving

## 2021-01-26 ENCOUNTER — Telehealth: Payer: Self-pay | Admitting: Podiatry

## 2021-01-26 LAB — URINE CULTURE

## 2021-01-26 NOTE — Telephone Encounter (Signed)
Patient would like an update on her MRI, she still has not been scheduled for it. I am going to cancel her appt for 01/30/2021. We will need to reschedule after MRI is completed

## 2021-01-26 NOTE — Progress Notes (Signed)
Please call and schedule her to come in for a nurse visit in the next week or two for a recheck of her urine due to hematuria. Also, ask her if her symptoms have improved. She can stop the antibiotic since her urine culture did not show an infection.

## 2021-01-26 NOTE — Telephone Encounter (Signed)
Sounds good, I have 2-3 patients pending MRIs will talk to [facility] this afternoon and see what the status of them is. I will message her on mychart when I have an update. Thanks

## 2021-01-30 ENCOUNTER — Ambulatory Visit: Payer: PPO | Admitting: Podiatry

## 2021-02-06 ENCOUNTER — Other Ambulatory Visit: Payer: Self-pay | Admitting: Family Medicine

## 2021-02-06 DIAGNOSIS — M810 Age-related osteoporosis without current pathological fracture: Secondary | ICD-10-CM

## 2021-02-07 ENCOUNTER — Ambulatory Visit
Admission: RE | Admit: 2021-02-07 | Discharge: 2021-02-07 | Disposition: A | Discharge status: Home / Self Care | Payer: PPO | Source: Ambulatory Visit | Attending: Podiatry | Admitting: Podiatry

## 2021-02-07 ENCOUNTER — Telehealth: Payer: Self-pay | Admitting: Family Medicine

## 2021-02-07 ENCOUNTER — Other Ambulatory Visit: Payer: Self-pay

## 2021-02-07 DIAGNOSIS — M76822 Posterior tibial tendinitis, left leg: Secondary | ICD-10-CM

## 2021-02-07 DIAGNOSIS — M2142 Flat foot [pes planus] (acquired), left foot: Secondary | ICD-10-CM

## 2021-02-07 DIAGNOSIS — M19072 Primary osteoarthritis, left ankle and foot: Secondary | ICD-10-CM | POA: Diagnosis not present

## 2021-02-07 DIAGNOSIS — M65872 Other synovitis and tenosynovitis, left ankle and foot: Secondary | ICD-10-CM | POA: Diagnosis not present

## 2021-02-07 DIAGNOSIS — M7732 Calcaneal spur, left foot: Secondary | ICD-10-CM | POA: Diagnosis not present

## 2021-02-07 NOTE — Telephone Encounter (Signed)
Pt called and states that she saw Vickie for hip pain, went for a xray and diagnosed with arthritis. She states that she is having pain especially at night. She states that when she was diagnosed nothing was really discussed. Please advise pt at [phone]. Pt uses [facility].

## 2021-02-08 ENCOUNTER — Encounter: Payer: Self-pay | Admitting: Family Medicine

## 2021-02-10 ENCOUNTER — Other Ambulatory Visit: Payer: Self-pay | Admitting: Family Medicine

## 2021-02-10 MED ORDER — TRAMADOL HCL 50 MG PO TABS: 50.0000 mg | ORAL_TABLET | Freq: Three times a day (TID) | ORAL | 0 refills | Status: AC | PRN

## 2021-02-15 ENCOUNTER — Encounter: Payer: Self-pay | Admitting: Orthopaedic Surgery

## 2021-02-15 ENCOUNTER — Other Ambulatory Visit: Payer: Self-pay

## 2021-02-15 ENCOUNTER — Ambulatory Visit: Payer: PPO | Admitting: Orthopaedic Surgery

## 2021-02-15 DIAGNOSIS — M545 Low back pain, unspecified: Secondary | ICD-10-CM | POA: Diagnosis not present

## 2021-02-15 MED ORDER — IBUPROFEN 800 MG PO TABS: 800.0000 mg | ORAL_TABLET | Freq: Three times a day (TID) | ORAL | 2 refills | Status: DC | PRN

## 2021-02-15 MED ORDER — PREDNISONE 10 MG (21) PO TBPK: ORAL_TABLET | ORAL | 0 refills | Status: DC

## 2021-02-15 MED ORDER — METHOCARBAMOL 500 MG PO TABS: 500.0000 mg | ORAL_TABLET | Freq: Four times a day (QID) | ORAL | 2 refills | Status: DC | PRN

## 2021-02-15 NOTE — Progress Notes (Signed)
Office Visit Note   Patient: Stacy Decker           Date of Birth: [birth year]           MRN: [id number] Visit Date: 02/15/2021              Requested by: Girtha Rm, NP-C [place],  [place] [id number] PCP: Girtha Rm, NP-C   Assessment & Plan: Visit Diagnoses:  1. Acute right-sided low back pain without sciatica     Plan: X-rays do reveal a fair amount of degenerative scoliosis and facet disease.  She has a fair amount of lumbar lordosis as well.  Based on findings I have recommended a referral to outpatient PT as well as a course of prednisone Dosepak, Robaxin, ibuprofen.  I explained that the neck step would be an MRI of the lumbar spine should this initial course of conservative treatment failed to provide relief.  Follow-Up Instructions: Return if symptoms worsen or fail to improve.   Orders:  Orders Placed This Encounter  Procedures   Ambulatory referral to Physical Therapy   Meds ordered this encounter  Medications   predniSONE (STERAPRED UNI-PAK 21 TAB) 10 MG (21) TBPK tablet    Sig: Take as directed    Dispense:  21 tablet    Refill:  0   methocarbamol (ROBAXIN) 500 MG tablet    Sig: Take 1 tablet (500 mg total) by mouth every 6 (six) hours as needed for muscle spasms.    Dispense:  30 tablet    Refill:  2   ibuprofen (ADVIL) 800 MG tablet    Sig: Take 1 tablet (800 mg total) by mouth every 8 (eight) hours as needed.    Dispense:  30 tablet    Refill:  2      Procedures: No procedures performed   Clinical Data: No additional findings.   Subjective: Chief Complaint  Patient presents with   Right Hip - Pain    Stacy Decker comes in today for recurrent right lower back and hip pain for about a month.  Denies any injuries.  Denies any radicular symptoms or groin pain.  I have seen her in the past for similar issue which got better with conservative treatments.   Review of Systems  Constitutional: Negative.   HENT: Negative.     Eyes: Negative.   Respiratory: Negative.    Cardiovascular: Negative.   Endocrine: Negative.   Musculoskeletal: Negative.   Neurological: Negative.   Hematological: Negative.   Psychiatric/Behavioral: Negative.    All other systems reviewed and are negative.   Objective: Vital Signs: There were no vitals taken for this visit.  Physical Exam Vitals and nursing note reviewed.  Constitutional:      Appearance: She is well-developed.  Pulmonary:     Effort: Pulmonary effort is normal.  Skin:    General: Skin is warm.     Capillary Refill: Capillary refill takes less than 2 seconds.  Neurological:     Mental Status: She is alert and oriented to person, place, and time.  Psychiatric:        Behavior: Behavior normal.        Thought Content: Thought content normal.        Judgment: Judgment normal.    Ortho Exam  Right lower back shows tenderness off to the right side of the paraspinous muscles.  Hip exam is completely normal with normal range of motion.  No focal motor or sensory deficits.  Specialty Comments:  No specialty comments available.  Imaging: No results found.   PMFS History: Patient Active Problem List   Diagnosis Date Noted   Spider veins 02/09/2020   Obesity (BMI 30-39.9) 07/31/2018   Estrogen deficiency 07/31/2018   Prediabetes 05/29/2017   Anxiety and depression 05/29/2017   Screening for cervical cancer 05/29/2017   Medicare annual wellness visit, subsequent 05/29/2017   Osteoporosis without current pathological fracture 03/12/2016   Elevated hemoglobin A1c 01/30/2016   Depression 04/05/2015   Generalized anxiety disorder 04/05/2015   Past Medical History:  Diagnosis Date   Anxiety    Depression    History of vertigo    Osteoporosis    Substance abuse ([place])    recovering alcoholic    Family History  Problem Relation Age of Onset   Hypertension Mother    Cancer Mother        breast in remission age 8   Diabetes Mother    Diabetes  Paternal Grandmother    Dementia Father        UTIs    Other Father        brain tumor   Other Sister        hip replacements   Alcohol abuse Sister    Other Sister        breast cysts- benign   Colon cancer Neg Hx    Stomach cancer Neg Hx    Rectal cancer Neg Hx    Esophageal cancer Neg Hx    Liver cancer Neg Hx    Breast cancer Neg Hx    BRCA 1/2 Neg Hx     Past Surgical History:  Procedure Laterality Date   AUGMENTATION MAMMAPLASTY Bilateral    carpel tunnel     PLACEMENT OF BREAST IMPLANTS     Social History   Occupational History   Occupation: Therapy asst At [employer]  Tobacco Use   Smoking status: Former    Types: Cigarettes    Quit date: 06/28/1996    Years since quitting: 24.6   Smokeless tobacco: Never  Substance and Sexual Activity   Alcohol use: No    Alcohol/week: 0.0 standard drinks    Comment: recovering alcoholic quit in 9622   Drug use: No    Comment: former marijuana   Sexual activity: Not Currently    Birth control/protection: Post-menopausal

## 2021-02-25 NOTE — Progress Notes (Signed)
   Subjective:    Patient ID: Stacy Decker, female    DOB: 01/14/1951, 70 y.o.   MRN: [id number]  HPI Chief Complaint  Patient presents with   Osteoporosis    Had a flare up of arthritis in hip just completed prednisone by ortho, starts PT on Wednesday   She has been on Fosamax since October 2017, 5 years. Bone density scheduled next month.  She has been doing well on medication and no concerns.  Her bone density in 2020 had showed improvement since 2017 when she started on the medication.  No other concerns today.  No fever, chills, dizziness, chest pain, palpitations, shortness of breath, nausea, vomiting or diarrhea.   Review of Systems Pertinent positives and negatives in the history of present illness.     Objective:   Physical Exam BP 130/70 (BP Location: Right Arm, Patient Position: Sitting)   Pulse 79   Ht 5' (1.524 m)   Wt 149 lb (67.6 kg)   SpO2 96%   BMI 29.10 kg/m   Alert and oriented and in no acute distress.  Respirations unlabored.  Normal mood.      Assessment & Plan:  Age-related osteoporosis without current pathological fracture  Estrogen deficiency  We discussed that she can stop the Fosamax after being on the medication for 5 years.  She will have her follow-up bone density next month.  Discussed her results will determine the next step in her treatment but for now she will continue getting adequate calcium in her diet, vitamin D supplement and weightbearing exercises.

## 2021-02-26 ENCOUNTER — Encounter: Payer: Self-pay | Admitting: Family Medicine

## 2021-02-26 ENCOUNTER — Ambulatory Visit (INDEPENDENT_AMBULATORY_CARE_PROVIDER_SITE_OTHER): Payer: PPO | Admitting: Family Medicine

## 2021-02-26 ENCOUNTER — Other Ambulatory Visit: Payer: Self-pay

## 2021-02-26 VITALS — BP 130/70 | HR 79 | Ht 60.0 in | Wt 149.0 lb

## 2021-02-26 DIAGNOSIS — M810 Age-related osteoporosis without current pathological fracture: Secondary | ICD-10-CM | POA: Diagnosis not present

## 2021-02-26 DIAGNOSIS — E2839 Other primary ovarian failure: Secondary | ICD-10-CM | POA: Diagnosis not present

## 2021-02-26 NOTE — Patient Instructions (Addendum)
Your last bone density in 2020 was (-2.6) and this was better than 2017 (-2.8).  Closer to 0 is better.   Get your bone density as scheduled in November.   You can stop the Fosamax now that you have been on it for 5 years consistently.

## 2021-02-28 ENCOUNTER — Encounter: Payer: Self-pay | Admitting: Rehabilitative and Restorative Service Providers"

## 2021-02-28 ENCOUNTER — Other Ambulatory Visit: Payer: Self-pay

## 2021-02-28 ENCOUNTER — Ambulatory Visit: Payer: PPO | Admitting: Rehabilitative and Restorative Service Providers"

## 2021-02-28 DIAGNOSIS — M545 Low back pain, unspecified: Secondary | ICD-10-CM | POA: Diagnosis not present

## 2021-02-28 DIAGNOSIS — R262 Difficulty in walking, not elsewhere classified: Secondary | ICD-10-CM | POA: Diagnosis not present

## 2021-02-28 DIAGNOSIS — R293 Abnormal posture: Secondary | ICD-10-CM

## 2021-02-28 DIAGNOSIS — M6281 Muscle weakness (generalized): Secondary | ICD-10-CM

## 2021-02-28 NOTE — Patient Instructions (Signed)
Access Code: [email or web address] URL: [email or web address] Date: 02/28/2021 Prepared by: Vista Mink  Exercises Left Standing Lateral Shift Correction at [place] - 5 x daily - 7 x weekly - 1 sets - 5 reps - 3 seconds hold Standing Lumbar Extension at [place] - 5 x daily - 7 x weekly - 1 sets - 5 reps - 3 seconds hold Standing Scapular Retraction - 5 x daily - 7 x weekly - 1 sets - 5 reps - 5 second hold Prone Hip Extension - 2 x daily - 7 x weekly - 1-2 sets - 10 reps - 3 seconds hold

## 2021-02-28 NOTE — Therapy (Signed)
[place], [place], [address] Phone: [phone]   Fax:  [phone]  Physical Therapy Evaluation  Patient Details  Name: Stacy Decker MRN: [id number] Date of Birth: 1951-02-05 Referring Provider (PT): Leandrew Koyanagi MD   Encounter Date: 02/28/2021   PT End of Session - 02/28/21 1207     Visit Number 1    Number of Visits 16    Date for PT Re-Evaluation 04/25/21    PT Start Time 1100    PT Stop Time 1144    PT Time Calculation (min) 44 min    Activity Tolerance Patient tolerated treatment well;No increased pain    Behavior During Therapy WFL for tasks assessed/performed             Past Medical History:  Diagnosis Date   Anxiety    Depression    History of vertigo    Osteoporosis    Substance abuse ([place])    recovering alcoholic    Past Surgical History:  Procedure Laterality Date   AUGMENTATION MAMMAPLASTY Bilateral    carpel tunnel     PLACEMENT OF BREAST IMPLANTS      There were no vitals filed for this visit.    Subjective Assessment - 02/28/21 1201     Subjective Stacy Decker had sharp R iliac crest pain last year about this time.  Symptoms got better.  The same symptoms have reappeared a year later in the same spot.  Stacy Decker admits she sits too much and doesn't exercise.  She would like to return to tennis if her R iliac crest and foot pain (seperate issue) improve.    Pertinent History Vertigo, osteoporosis    Limitations Sitting;House hold activities;Lifting    How long can you sit comfortably? An hour    How long can you stand comfortably? Doesn't stand for long periods of time    How long can you walk comfortably? Walks the dog, exercise walking encouraged    Patient Stated Goals Get rid of R iliac crest pain    Currently in Pain? Yes    Pain Score 5     Pain Location Hip    Pain Orientation Right    Pain Descriptors / Indicators Aching;Sharp;Tingling    Pain Type Acute pain    Pain  Radiating Towards NA    Pain Onset 1 to 4 weeks ago    Pain Frequency Constant    Aggravating Factors  Prolonged postures    Pain Relieving Factors Meds, heat and ice    Effect of Pain on Daily Activities Limits dog walks, comfort and endurance with house chores    Multiple Pain Sites No                [facility] PT Assessment - 02/28/21 0001       Assessment   Medical Diagnosis Low back pain without sciatica    Referring Provider (PT) Leandrew Koyanagi MD    Onset Date/Surgical Date --   Last October, got better, has returned   Next MD Visit NA      Precautions   Precautions Back    Precaution Comments Avoid flexion, flexion with rotation and flexed postures      Restrictions   Weight Bearing Restrictions No      Balance Screen   Has the patient fallen in the past 6 months No    Has the patient had a decrease in activity level because of a fear of falling?  No  Is the patient reluctant to leave their home because of a fear of falling?  No      Home Ec[employer] residence    Additional Comments No problems with stairs      Prior Function   Level of Independence Independent    Vocation Part time employment    Vocation Requirements Sitting at a computer    Leisure Walking the dog, TV      Cognition   Overall Cognitive Status Within Functional Limits for tasks assessed      Observation/Other Assessments   Focus on Therapeutic Outcomes (FOTO)  46 (Goal 67)      Posture/Postural Control   Posture/Postural Control Postural limitations      ROM / Strength   AROM / PROM / Strength AROM;Strength      AROM   Overall AROM  Deficits    AROM Assessment Site Lumbar;Hip    Right/Left Hip Left;Right    Right Hip Flexion 105    Right Hip External Rotation  27    Right Hip Internal Rotation  20    Left Hip Flexion 110    Left Hip External Rotation  30    Left Hip Internal Rotation  15    Lumbar Extension 10      Strength   Overall Strength  Deficits    Strength Assessment Site Lumbar    Lumbar Extension --   27 seconds spine strength test (60 seconds Goal)     Flexibility   Soft Tissue Assessment /Muscle Length yes    Hamstrings 50 degrees B                        Objective measurements completed on examination: See above findings.       [facility] Adult PT Treatment/Exercise - 02/28/21 0001       Posture/Postural Control   Postural Limitations Forward head;Rounded Shoulders;Increased lumbar lordosis;Flexed trunk      Therapeutic Activites    Therapeutic Activities ADL's    ADL's See patient education (shift correction, imaging, anatomy)      Exercises   Exercises Lumbar      Lumbar Exercises: Standing   Scapular Retraction Strengthening;Both;10 reps;Limitations    Scapular Retraction Limitations 5 seconds (shoulder blade pinches)    Other Standing Lumbar Exercises Shift correction (push hips L) 2 sets of 10 for 3 seconds    Other Standing Lumbar Exercises Trunk extension after shift correction 2 sets of 10 for 3 seconds      Lumbar Exercises: Prone   Straight Leg Raise 10 reps;3 seconds    Straight Leg Raises Limitations Forehead on forearms, toes back                     PT Education - 02/28/21 1205     Education Details Spent time looking in the mirror working on active shift correction (working on shifting hips L).  Reviewed exam findings, imaging and spine anatomy using spine skeleton model.    Person(s) Educated Patient    Methods Explanation;Demonstration;Tactile cues;Verbal cues;Handout    Comprehension Verbal cues required;Need further instruction;Returned demonstration;Verbalized understanding;Tactile cues required              PT Short Term Goals - 02/28/21 1214       PT SHORT TERM GOAL #1   Title Improve trunk extension AROM to 20 degrees.    Baseline 10 degrees.    Time 4  Period Weeks    Status New    Target Date 03/28/21      PT SHORT TERM GOAL #2    Title Stacy Decker will be able to self-correct R lateral shift when cued.    Baseline Just started today.    Time 4    Period Weeks    Status New    Target Date 03/28/21               PT Long Term Goals - 02/28/21 1215       PT LONG TERM GOAL #1   Title Improve FOTO to 67.    Baseline 46    Time 8    Period Weeks    Status New    Target Date 04/25/21      PT LONG TERM GOAL #2   Title Improve low back and R iliac crest pain to consistently 0-2/10 on the Numeric Pain Rating Scale.    Baseline 5+/10    Time 8    Period Weeks    Status New    Target Date 04/25/21      PT LONG TERM GOAL #3   Title Improve B hip flexors flexibility to 110 degrees and hip ER AROM to 40 degrees.    Baseline 105-110 and 27-30 degrees, respectively    Time 8    Period Weeks    Status New    Target Date 04/25/21      PT LONG TERM GOAL #4   Title Improve spine strength as assessed by the prone strength test (60 seconds).    Baseline 27 seconds    Time 8    Period Weeks    Status New    Target Date 04/25/21      PT LONG TERM GOAL #5   Title Stacy Decker will be independent with her long-term HEP at [place].    Baseline Prescribed starter program today.    Time 8    Period Weeks    Status New    Target Date 04/25/21                    Plan - 02/28/21 1209     Clinical Impression Statement Stacy Decker is having a second battle with R iliac crest pain.  The first was a year ago and this resolved.  A year later, she is having an exacerbation with worse pain.  Stacy Decker has a R lateral shift consistent with her scoliosis.  We did work on shift correction, along with extension to reduce postural strain and improve lumbar AROM at the facet joints limited on examination).  Stacy Decker will benefit from core and hip abductors strengthening, postural correction and practical body mechanics to meet LTGs.    Personal Factors and Comorbidities Comorbidity 2    Comorbidities Vertigo, osteoporosis    Examination-Activity  Limitations Sit;Bend;Lift    Examination-Participation Restrictions Occupation;Community Activity    Stability/Clinical Decision Making Stable/Uncomplicated    Clinical Decision Making Low    Rehab Potential Good    PT Frequency 2x / week    PT Duration 8 weeks    PT Treatment/Interventions ADLs/Self Care Home Management;Moist Heat;Cryotherapy;Traction;Therapeutic activities;Therapeutic exercise;Neuromuscular re-education;Patient/family education;Manual techniques;Dry needling    PT Next Visit Plan Review HEP, continue shift/postural correction, postural/core/hip abductors strengthening, address limited hip ER AROM and practical body mechanics.  Encourage walking for exercise.    PT Home Exercise Plan Access Code: [email or web address]  URL: [email or web address]  Date: 02/28/2021  Prepared by: Vista Mink  Exercises  Left Standing Lateral Shift Correction at [place] - 5 x daily - 7 x weekly - 1 sets - 5 reps - 3 seconds hold  Standing Lumbar Extension at [place] - 5 x daily - 7 x weekly - 1 sets - 5 reps - 3 seconds hold  Standing Scapular Retraction - 5 x daily - 7 x weekly - 1 sets - 5 reps - 5 second hold  Prone Hip Extension - 2 x daily - 7 x weekly - 1-2 sets - 10 reps - 3 seconds hold    Consulted and Agree with Plan of Care Patient             Patient will benefit from skilled therapeutic intervention in order to improve the following deficits and impairments:  Decreased activity tolerance, Decreased endurance, Decreased range of motion, Decreased strength, Difficulty walking, Increased fascial restricitons, Increased edema, Hypomobility, Increased muscle spasms, Impaired flexibility, Improper body mechanics, Postural dysfunction, Pain  Visit Diagnosis: Abnormal posture  Muscle weakness (generalized)  Difficulty in walking, not elsewhere classified  Acute right-sided low back pain without sciatica     Problem List Patient Active Problem List   Diagnosis Date  Noted   Spider veins 02/09/2020   Obesity (BMI 30-39.9) 07/31/2018   Estrogen deficiency 07/31/2018   Prediabetes 05/29/2017   Anxiety and depression 05/29/2017   Screening for cervical cancer 05/29/2017   Medicare annual wellness visit, subsequent 05/29/2017   Osteoporosis without current pathological fracture 03/12/2016   Elevated hemoglobin A1c 01/30/2016   Depression 04/05/2015   Generalized anxiety disorder 04/05/2015    Farley Ly, PT, MPT 02/28/2021, 12:19 PM  [facility] [address] [place], [place], [address] Phone: [phone]   Fax:  [phone]  Name: Stacy Decker MRN: [id number] Date of Birth: Oct 03, 1950

## 2021-03-15 ENCOUNTER — Encounter: Payer: Self-pay | Admitting: Rehabilitative and Restorative Service Providers"

## 2021-03-15 ENCOUNTER — Ambulatory Visit: Payer: PPO | Admitting: Rehabilitative and Restorative Service Providers"

## 2021-03-15 ENCOUNTER — Other Ambulatory Visit: Payer: Self-pay

## 2021-03-15 DIAGNOSIS — M545 Low back pain, unspecified: Secondary | ICD-10-CM

## 2021-03-15 DIAGNOSIS — M6281 Muscle weakness (generalized): Secondary | ICD-10-CM | POA: Diagnosis not present

## 2021-03-15 DIAGNOSIS — R293 Abnormal posture: Secondary | ICD-10-CM | POA: Diagnosis not present

## 2021-03-15 DIAGNOSIS — R262 Difficulty in walking, not elsewhere classified: Secondary | ICD-10-CM | POA: Diagnosis not present

## 2021-03-15 NOTE — Therapy (Signed)
[place], [place], [address] Phone: [phone]   Fax:  [phone]  Physical Therapy Treatment  Patient Details  Name: Stacy Decker MRN: [id number] Date of Birth: [birth year] Referring Provider (PT): Leandrew Koyanagi MD   Encounter Date: 03/15/2021   PT End of Session - 03/15/21 1205     Visit Number 2    Number of Visits 16    Date for PT Re-Evaluation 04/25/21    PT Start Time 1024    PT Stop Time 1103    PT Time Calculation (min) 39 min    Activity Tolerance Patient tolerated treatment well;No increased pain    Behavior During Therapy WFL for tasks assessed/performed             Past Medical History:  Diagnosis Date   Anxiety    Depression    History of vertigo    Osteoporosis    Substance abuse ([place])    recovering alcoholic    Past Surgical History:  Procedure Laterality Date   AUGMENTATION MAMMAPLASTY Bilateral    carpel tunnel     PLACEMENT OF BREAST IMPLANTS      There were no vitals filed for this visit.   Subjective Assessment - 03/15/21 1201     Subjective [name] reports poor HEP compliance thus far.  Unsurprisingly, there is little change in her R iliac crest pain.    Pertinent History Vertigo, osteoporosis    Limitations Sitting;House hold activities;Lifting    How long can you sit comfortably? An hour    How long can you stand comfortably? Doesn't stand for long periods of time    How long can you walk comfortably? Walks the dog, exercise walking encouraged    Patient Stated Goals Get rid of R iliac crest pain    Currently in Pain? Yes    Pain Score 5     Pain Location Hip    Pain Orientation Right    Pain Descriptors / Indicators Aching;Tingling;Sharp    Pain Type Acute pain    Pain Radiating Towards NA    Pain Onset More than a month ago    Pain Frequency Constant    Aggravating Factors  Prolonged postures    Pain Relieving Factors Medication, heat and ice    Effect of  Pain on Daily Activities Limits dog walks, comfort and endurance with house chores    Multiple Pain Sites No                               [facility] Adult PT Treatment/Exercise - 03/15/21 0001       Posture/Postural Control   Posture/Postural Control Postural limitations    Postural Limitations Forward head;Rounded Shoulders;Increased lumbar lordosis;Flexed trunk      Therapeutic Activites    Therapeutic Activities ADL's    ADL's Practical golfer's and diagonal lift, log roll      Exercises   Exercises Lumbar      Lumbar Exercises: Stretches   Figure 4 Stretch 4 reps;20 seconds      Lumbar Exercises: Standing   Scapular Retraction Strengthening;Both;10 reps;Limitations    Scapular Retraction Limitations 5 seconds (shoulder blade pinches)    Other Standing Lumbar Exercises Shift correction (push hips L) 2 sets of 10 for 3 seconds    Other Standing Lumbar Exercises Trunk extension after shift correction 2 sets of 10 for 3 seconds      Lumbar Exercises:  Seated   Sit to Stand 10 reps;Limitations    Sit to Stand Limitations slow eccentrics      Lumbar Exercises: Prone   Straight Leg Raise 10 reps;3 seconds    Straight Leg Raises Limitations Forehead on forearms, toes back 2 sets                     PT Education - 03/15/21 1203     Education Details Reviewed shift correction (needed feedback) and day 1 HEP given poor early compliance.  Practiced golfer's and diagonal squat lifts, discussed ADLs like getting socks and shoes on and off and practical bed mobility.  Progressed HEP.    Person(s) Educated Patient    Methods Explanation;Demonstration;Tactile cues;Verbal cues;Handout    Comprehension Verbalized understanding;Tactile cues required;Need further instruction;Returned demonstration;Verbal cues required              PT Short Term Goals - 02/28/21 1214       PT SHORT TERM GOAL #1   Title Improve trunk extension AROM to 20 degrees.     Baseline 10 degrees.    Time 4    Period Weeks    Status New    Target Date 03/28/21      PT SHORT TERM GOAL #2   Title Cherylann will be able to self-correct R lateral shift when cued.    Baseline Just started today.    Time 4    Period Weeks    Status New    Target Date 03/28/21               PT Long Term Goals - 02/28/21 1215       PT LONG TERM GOAL #1   Title Improve FOTO to 67.    Baseline 46    Time 8    Period Weeks    Status New    Target Date 04/25/21      PT LONG TERM GOAL #2   Title Improve low back and R iliac crest pain to consistently 0-2/10 on the Numeric Pain Rating Scale.    Baseline 5+/10    Time 8    Period Weeks    Status New    Target Date 04/25/21      PT LONG TERM GOAL #3   Title Improve B hip flexors flexibility to 110 degrees and hip ER AROM to 40 degrees.    Baseline 105-110 and 27-30 degrees, respectively    Time 8    Period Weeks    Status New    Target Date 04/25/21      PT LONG TERM GOAL #4   Title Improve spine strength as assessed by the prone strength test (60 seconds).    Baseline 27 seconds    Time 8    Period Weeks    Status New    Target Date 04/25/21      PT LONG TERM GOAL #5   Title Librada will be independent with her long-term HEP at [place].    Baseline Prescribed starter program today.    Time 8    Period Weeks    Status New    Target Date 04/25/21                   Plan - 03/15/21 1205     Clinical Impression Statement Princes reports being distracted by a kitchen renovation and poor early HEP compliance.  She did report she will do better moving forward.  Corrections were  needed with her day 1 HEP.  Started practical body mechanics and started to address other impairments noted at evaluation (hip ER AROM. leg and core strength).  Her prognosis remains good with appropriate HEP compliance.    Personal Factors and Comorbidities Comorbidity 2    Comorbidities Vertigo, osteoporosis    Examination-Activity  Limitations Sit;Bend;Lift    Examination-Participation Restrictions Occupation;Community Activity    Stability/Clinical Decision Making Stable/Uncomplicated    Rehab Potential Good    PT Frequency 2x / week    PT Duration 8 weeks    PT Treatment/Interventions ADLs/Self Care Home Management;Moist Heat;Cryotherapy;Traction;Therapeutic activities;Therapeutic exercise;Neuromuscular re-education;Patient/family education;Manual techniques;Dry needling    PT Next Visit Plan Review HEP, continue shift/postural correction, postural/core/leg/hip abductors strengthening and practical body mechanics.  Follow-up on recommendations made regarding walking for exercise.    PT Home Exercise Plan Access Code: [email or web address]    Consulted and Agree with Plan of Care Patient             Patient will benefit from skilled therapeutic intervention in order to improve the following deficits and impairments:  Decreased activity tolerance, Decreased endurance, Decreased range of motion, Decreased strength, Difficulty walking, Increased fascial restricitons, Increased edema, Hypomobility, Increased muscle spasms, Impaired flexibility, Improper body mechanics, Postural dysfunction, Pain  Visit Diagnosis: Abnormal posture  Muscle weakness (generalized)  Difficulty in walking, not elsewhere classified  Acute right-sided low back pain without sciatica     Problem List Patient Active Problem List   Diagnosis Date Noted   Spider veins 02/09/2020   Obesity (BMI 30-39.9) 07/31/2018   Estrogen deficiency 07/31/2018   Prediabetes 05/29/2017   Anxiety and depression 05/29/2017   Screening for cervical cancer 05/29/2017   Medicare annual wellness visit, subsequent 05/29/2017   Osteoporosis without current pathological fracture 03/12/2016   Elevated hemoglobin A1c 01/30/2016   Depression 04/05/2015   Generalized anxiety disorder 04/05/2015    Farley Ly, PT, MPT 03/15/2021, 12:09 PM  [facility]  Physical Therapy [address] [place], [place], [address] Phone: [phone]   Fax:  [phone]  Name: Anisah Kuck MRN: [id number] Date of Birth: [birth year]

## 2021-03-15 NOTE — Patient Instructions (Signed)
Access Code: [email or web address] URL: [email or web address] Date: 03/15/2021 Prepared by: Vista Mink  Exercises Left Standing Lateral Shift Correction at [place] - 5 x daily - 7 x weekly - 1 sets - 5 reps - 3 seconds hold Standing Lumbar Extension at [place] - 5 x daily - 7 x weekly - 1 sets - 5 reps - 3 seconds hold Standing Scapular Retraction - 5 x daily - 7 x weekly - 1 sets - 5 reps - 5 second hold Prone Hip Extension - 2 x daily - 7 x weekly - 1-2 sets - 10 reps - 3 seconds hold Supine Figure 4 Piriformis Stretch - 2 x daily - 7 x weekly - 1 sets - 5 reps - 20 seconds hold Sit to Stand with Armchair - 2 x daily - 7 x weekly - 1 sets - 10 reps

## 2021-03-19 ENCOUNTER — Encounter: Payer: Self-pay | Admitting: Podiatry

## 2021-03-19 ENCOUNTER — Other Ambulatory Visit: Payer: Self-pay

## 2021-03-19 ENCOUNTER — Ambulatory Visit: Payer: PPO | Admitting: Podiatry

## 2021-03-19 DIAGNOSIS — M21612 Bunion of left foot: Secondary | ICD-10-CM | POA: Diagnosis not present

## 2021-03-19 DIAGNOSIS — M21862 Other specified acquired deformities of left lower leg: Secondary | ICD-10-CM

## 2021-03-19 DIAGNOSIS — M2012 Hallux valgus (acquired), left foot: Secondary | ICD-10-CM

## 2021-03-19 DIAGNOSIS — M76822 Posterior tibial tendinitis, left leg: Secondary | ICD-10-CM

## 2021-03-19 DIAGNOSIS — M216X2 Other acquired deformities of left foot: Secondary | ICD-10-CM

## 2021-03-19 DIAGNOSIS — M2142 Flat foot [pes planus] (acquired), left foot: Secondary | ICD-10-CM | POA: Diagnosis not present

## 2021-03-19 DIAGNOSIS — M810 Age-related osteoporosis without current pathological fracture: Secondary | ICD-10-CM | POA: Diagnosis not present

## 2021-03-19 DIAGNOSIS — M25572 Pain in left ankle and joints of left foot: Secondary | ICD-10-CM

## 2021-03-19 DIAGNOSIS — M62462 Contracture of muscle, left lower leg: Secondary | ICD-10-CM

## 2021-03-19 NOTE — Progress Notes (Signed)
Subjective:  Patient ID: Stacy Decker, female    DOB: 31-Dec-1950,  MRN: [id number]  Chief Complaint  Patient presents with   Flat Foot    Follow up after MRI    70 y.o. female presents with the above complaint. History confirmed with patient.  She returns for follow-up for surgical planning and review of her MRI  Objective:  Physical Exam: warm, good capillary refill, no trophic changes or ulcerative lesions, normal DP and PT pulses, and normal sensory exam. Left Foot: Severe pes planus deformity she has gastrocnemius equinus and pain of the posterior tibial tendon and sinus tarsi with calcaneal valgus on weightbearing  Radiographs: Multiple views x-ray of left foot and ankle, new calcaneal alignment view taken today previous radiographs also reviewed: She has severe pes planovalgus deformity with calcaneal valgus as well, ankle congruent  Study Result  Narrative & Impression  CLINICAL DATA:  Ankle pain and possible instability.  Remote trauma.   EXAM: MRI OF THE LEFT ANKLE WITHOUT CONTRAST   TECHNIQUE: Multiplanar, multisequence MR imaging of the ankle was performed. No intravenous contrast was administered.   COMPARISON:  12/01/2020 and 12/26/2020 radiographs   FINDINGS: TENDONS   Peroneal: Common peroneus tendon sheath tenosynovitis likely, with likely chronic laxity of the overlying peroneal retinaculum to allow for lateral deviation of the peroneal tendons given the abnormal pseudoarticulation between the distal fibular tip and the calcaneus which may preclude normal course of the peroneal tendons.   Posteromedial: Mild tibialis posterior and flexor digitorum longus tenosynovitis with mild distal tibialis posterior tendinopathy.   Anterior: Mildly thickened distal tibialis anterior tendon suggesting tendinopathy.   Achilles: Minimal Achilles tendinopathy with epicenter about 4.3 cm distal to the Achilles tendon insertion. Low-grade edema in Kager's fat  pad.   Plantar Fascia: Unremarkable.  Plantar calcaneal spur noted.   LIGAMENTS   Lateral: The inferior tibiofibular ligaments appear grossly intact. Attenuated anterior talofibular ligament, likely injured in the past. Nonvisualization of the calcaneofibular ligament.   Medial: The deltoid ligament is grossly intact. There is substantial thickening of the superomedial portion of the spring ligament along with thickening of the medioplantar oblique and ill definition of the inferoplantar longitudinal components of the spring ligament.   CARTILAGE   Ankle Joint: Adjacent foci of subcortical marrow edema along the posterolateral tibiotalar joint on image 16 series 7. Degenerative chondral thinning noted.   Subtalar Joints/Sinus Tarsi: Substantial lateral hindfoot impingement as on images 11-12 series 6, with some valgus rotation of the talocalcaneal articulation resulting in pseudoarticulation of the lateral calcaneus with the fibular tip, with intervening fluid signal as on image 19 series 7 (calcaneofibular impingement). The sinus tarsi is substantially narrowed in this case.   Bones: Notable pes planus with substantial valgus angulation at the talonavicular joint and substantial abnormally increased talonavicular coverage angle. Striking dorsal degenerative arthropathy between the medial cuneiform and the base of the first metatarsal. Lisfranc ligament seems grossly intact. Dorsal talonavicular spurring and subcortical marrow edema. Erosions versus degenerative subcortical cysts along the articulation of the cuboid with the lateral cuneiform.   Other: Mild subcutaneous edema in the ankle.   IMPRESSION: 1. Pes planus with substantial lateral hindfoot impingement and calcaneofibular impingement, as well as a markedly elevated talonavicular coverage angle. 2. Common peroneus tenosynovitis and lateral deviation of the peroneus tendons implying retinacular laxity necessary due  to the calcaneofibular impingement. 3. Relatively mild distal tibialis posterior tendinopathy along with tibialis posterior and flexor digitorum longus tenosynovitis. There is substantial thickening of the superomedial  portion of the spring ligament with thickening of the medioplantar oblique and ill definition of the inferoplantar longitudinal portions of the spring ligament. 4. Attenuated anterior talofibular ligament, possibly from a prior injury. Nonvisualization of the calcaneofibular ligament. 5. Scattered degenerative arthropathy most notably between the medial cuneiform and the base of the first metatarsal, and along the cuboid and lateral cuneiform. 6. Minimal distal Achilles tendinopathy. 7. Thickened distal tibialis anterior tendon compatible with tendinopathy.     Electronically Signed   By: Van Clines M.D.   On: 02/08/2021 16:55   Assessment:   1. Acquired pes planus, left   2. Posterior tibial tendon dysfunction, left   3. Gastrocnemius equinus of left lower extremity   4. Sinus tarsi syndrome, left   5. Acquired forefoot varus, left   6. Hallux valgus with bunions, left   7. Age-related osteoporosis without current pathological fracture      Plan:  Patient was evaluated and treated and all questions answered.  Reviewed the findings of the MRI with her in detail.  We discussed again her significant foot and ankle deformity reviewed her previous radiographs.  Her MRI shows the PT tendon and peroneal tendons have inflammation but have good integrity.  There is attenuation of the spring ligament which I expected but the deltoid ligament is intact although thickened.  I reviewed this with her and think we can proceed with surgical correction.  She has exhausted all conservative treatments.  Surgical we discussed the following procedures: Double, possible triple arthrodesis using autologous and autogenous bone grafting as well as platelet derived growth factor,  Achilles tendon lengthening, cotton osteotomy versus Lapidus arthrodesis which will be an intraoperative decision.  We discussed the risk benefits and potential complications of this which may include but not limited to insert risks.  I reviewed her last vitamin D level from 08/09/2019 which was quite good at 56.5 ng/mL.  I would like to recheck this prior to surgery.  She is due for a DEXA scan next month.  All questions were addressed today.  Surgery be scheduled for next January.  She is currently undergoing physical therapy for her right hip and I would like her therapist to work on her with think she may need to be nonweightbearing such as a knee scooter or crutches walker or bedside commode or shower chair.  She thinks is a good idea as well.  Surgery will be a post operative same day observation admission for 1 night.   Surgical plan:  Procedure: -Double, possible triple arthrodesis using autologous and autogenous bone grafting as well as platelet derived growth factor, Achilles tendon lengthening, cotton osteotomy versus Lapidus arthrodesis   Location: -[facility]  Anesthesia plan: -General anesthesia with regional block  Postoperative pain plan: - Tylenol 1000 mg every 6 hours, gabapentin 300 mg every 8 hours x5 days, oxycodone 5 mg 1-2 tabs every 6 hours only as needed  DVT prophylaxis: -Xarelto 10 mg nightly  WB Restrictions / DME needs: -NWB in short leg cast or splint after surgery   No follow-ups on file.

## 2021-03-21 ENCOUNTER — Encounter: Payer: PPO | Admitting: Family Medicine

## 2021-03-21 DIAGNOSIS — F411 Generalized anxiety disorder: Secondary | ICD-10-CM | POA: Diagnosis not present

## 2021-03-21 DIAGNOSIS — F331 Major depressive disorder, recurrent, moderate: Secondary | ICD-10-CM | POA: Diagnosis not present

## 2021-03-23 ENCOUNTER — Other Ambulatory Visit: Payer: Self-pay

## 2021-03-23 ENCOUNTER — Encounter: Payer: Self-pay | Admitting: Rehabilitative and Restorative Service Providers"

## 2021-03-23 ENCOUNTER — Ambulatory Visit: Payer: PPO | Admitting: Rehabilitative and Restorative Service Providers"

## 2021-03-23 ENCOUNTER — Encounter: Payer: PPO | Admitting: Family Medicine

## 2021-03-23 DIAGNOSIS — M545 Low back pain, unspecified: Secondary | ICD-10-CM

## 2021-03-23 DIAGNOSIS — R293 Abnormal posture: Secondary | ICD-10-CM | POA: Diagnosis not present

## 2021-03-23 DIAGNOSIS — M6281 Muscle weakness (generalized): Secondary | ICD-10-CM | POA: Diagnosis not present

## 2021-03-23 DIAGNOSIS — R262 Difficulty in walking, not elsewhere classified: Secondary | ICD-10-CM

## 2021-03-23 NOTE — Therapy (Addendum)
[facility] [address] [place], [place], [address] Phone: [phone]   Fax:  [phone]  Physical Therapy Treatment  Patient Details  Name: Stacy Decker MRN: [id number] Date of Birth: 1951/05/23 Referring Provider (PT): Leandrew Koyanagi MD  PHYSICAL THERAPY DISCHARGE SUMMARY  Visits from Start of Care: 3  Current functional level related to goals / functional outcomes: See note   Remaining deficits: See note   Education / Equipment: See note   Patient agrees to discharge. Patient goals were not met. Patient is being discharged due to not returning since the last visit.   Encounter Date: 03/23/2021   PT End of Session - 03/23/21 1418     Visit Number 3    Number of Visits 16    Date for PT Re-Evaluation 04/25/21    PT Start Time 1017    PT Stop Time 1102    PT Time Calculation (min) 45 min    Activity Tolerance Patient tolerated treatment well;No increased pain    Behavior During Therapy WFL for tasks assessed/performed             Past Medical History:  Diagnosis Date   Anxiety    Depression    History of vertigo    Osteoporosis    Substance abuse ([place])    recovering alcoholic    Past Surgical History:  Procedure Laterality Date   AUGMENTATION MAMMAPLASTY Bilateral    carpel tunnel     PLACEMENT OF BREAST IMPLANTS      There were no vitals filed for this visit.   Subjective Assessment - 03/23/21 1037     Subjective Stacy Decker to struggle with HEP compliance.  Ibuprofen or topical pain treatment.    Pertinent History Vertigo, osteoporosis    Limitations Sitting;House hold activities;Lifting    How long can you sit comfortably? An hour    How long can you stand comfortably? Doesn't stand for long periods of time    How long can you walk comfortably? Walks the dog, exercise walking encouraged    Patient Stated Goals Get rid of R iliac crest pain    Currently in Pain? Yes    Pain Score 4     Pain  Location Hip    Pain Orientation Right    Pain Descriptors / Indicators Aching;Tingling;Sharp    Pain Type Acute pain    Pain Radiating Towards NA    Pain Onset More than a month ago    Pain Frequency Intermittent    Aggravating Factors  Prolonged postures    Pain Relieving Factors Pain meds and change of position    Effect of Pain on Daily Activities Limits dog walks, comfort and endurance with house chores    Multiple Pain Sites No                               [facility] Adult PT Treatment/Exercise - 03/23/21 0001       Posture/Postural Control   Posture/Postural Control Postural limitations    Postural Limitations Forward head;Rounded Shoulders;Increased lumbar lordosis;Flexed trunk      Therapeutic Activites    Therapeutic Activities ADL's    ADL's Golfer's and diagonal lift for the dog, kitchen/sink mechanics, vacuuming and sweeping, log roll bed mobility      Exercises   Exercises Lumbar      Lumbar Exercises: Stretches   Figure 4 Stretch 4 reps;20 seconds  Lumbar Exercises: Standing   Scapular Retraction Strengthening;Both;10 reps;Limitations    Scapular Retraction Limitations 5 seconds (shoulder blade pinches)    Other Standing Lumbar Exercises Shift correction (push hips L) 2 sets of 10 for 3 seconds & (2nd exercise) alternating hip hike in doorframe 10X 3 seconds    Other Standing Lumbar Exercises Trunk extension after shift correction 2 sets of 10 for 3 seconds      Lumbar Exercises: Seated   Sit to Stand 10 reps;Limitations    Sit to Stand Limitations slow eccentrics      Lumbar Exercises: Prone   Straight Leg Raise 10 reps;3 seconds    Straight Leg Raises Limitations Forehead on forearms, toes back 2 sets                     PT Education - 03/23/21 1416     Education Details Reviewed practical body mechanics for house chores and caring for her dog.  Strongly encouraged improved HEP compliance.    Person(s) Educated Patient     Methods Explanation;Demonstration;Verbal cues;Handout    Comprehension Verbalized understanding;Need further instruction;Returned demonstration;Verbal cues required              PT Short Term Goals - 02/28/21 1214       PT SHORT TERM GOAL #1   Title Improve trunk extension AROM to 20 degrees.    Baseline 10 degrees.    Time 4    Period Weeks    Status New    Target Date 03/28/21      PT SHORT TERM GOAL #2   Title Stacy Decker will be able to self-correct R lateral shift when cued.    Baseline Just started today.    Time 4    Period Weeks    Status New    Target Date 03/28/21               PT Long Term Goals - 02/28/21 1215       PT LONG TERM GOAL #1   Title Improve FOTO to 67.    Baseline 46    Time 8    Period Weeks    Status New    Target Date 04/25/21      PT LONG TERM GOAL #2   Title Improve low back and R iliac crest pain to consistently 0-2/10 on the Numeric Pain Rating Scale.    Baseline 5+/10    Time 8    Period Weeks    Status New    Target Date 04/25/21      PT LONG TERM GOAL #3   Title Improve B hip flexors flexibility to 110 degrees and hip ER AROM to 40 degrees.    Baseline 105-110 and 27-30 degrees, respectively    Time 8    Period Weeks    Status New    Target Date 04/25/21      PT LONG TERM GOAL #4   Title Improve spine strength as assessed by the prone strength test (60 seconds).    Baseline 27 seconds    Time 8    Period Weeks    Status New    Target Date 04/25/21      PT LONG TERM GOAL #5   Title Stacy Decker will be independent with her long-term HEP at DC.    Baseline Prescribed starter program today.    Time 8    Period Weeks    Status New    Target Date 04/25/21  Plan - 03/23/21 1418     Clinical Impression Statement Stacy Decker reports that her HEP compliance has not improved.  She is paying better attention to her body mechanics and she feels as if this is helping.  Significant spine and postural weakness  is present and this was an area of focus and progression today.  With 1-2X/day HEP compliance, Stacy Decker's prognosis is very good.  With current HEP compliance this will be a prolonged rehabilitation.    Personal Factors and Comorbidities Comorbidity 2    Comorbidities Vertigo, osteoporosis    Examination-Activity Limitations Sit;Bend;Lift    Examination-Participation Restrictions Occupation;Community Activity    Stability/Clinical Decision Making Stable/Uncomplicated    Rehab Potential Good    PT Frequency 2x / week    PT Duration 8 weeks    PT Treatment/Interventions ADLs/Self Care Home Management;Moist Heat;Cryotherapy;Traction;Therapeutic activities;Therapeutic exercise;Neuromuscular re-education;Patient/family education;Manual techniques;Dry needling    PT Next Visit Plan Postural and lumbar strengthening.    PT Home Exercise Plan Access Code: [email or web address]    Consulted and Agree with Plan of Care Patient             Patient will benefit from skilled therapeutic intervention in order to improve the following deficits and impairments:  Decreased activity tolerance, Decreased endurance, Decreased range of motion, Decreased strength, Difficulty walking, Increased fascial restricitons, Increased edema, Hypomobility, Increased muscle spasms, Impaired flexibility, Improper body mechanics, Postural dysfunction, Pain  Visit Diagnosis: Abnormal posture  Muscle weakness (generalized)  Difficulty in walking, not elsewhere classified  Acute right-sided low back pain without sciatica     Problem List Patient Active Problem List   Diagnosis Date Noted   Spider veins 02/09/2020   Obesity (BMI 30-39.9) 07/31/2018   Estrogen deficiency 07/31/2018   Prediabetes 05/29/2017   Anxiety and depression 05/29/2017   Screening for cervical cancer 05/29/2017   Medicare annual wellness visit, subsequent 05/29/2017   Osteoporosis without current pathological fracture 03/12/2016   Elevated hemoglobin A1c  01/30/2016   Depression 04/05/2015   Generalized anxiety disorder 04/05/2015    Farley Ly, PT, MPT 03/23/2021, 2:21 PM  [facility] [address] [place], [place], [address] Phone: [phone]   Fax:  [phone]  Name: Stacy Decker MRN: [id number] Date of Birth: [birth year]

## 2021-03-23 NOTE — Patient Instructions (Signed)
Access Code: [email or web address] URL: [email or web address] Date: 03/23/2021 Prepared by: Vista Mink  Exercises Left Standing Lateral Shift Correction at [place] - 5 x daily - 7 x weekly - 1 sets - 5 reps - 3 seconds hold Standing Lumbar Extension at [place] - 5 x daily - 7 x weekly - 1 sets - 5 reps - 3 seconds hold Standing Scapular Retraction - 5 x daily - 7 x weekly - 1 sets - 5 reps - 5 second hold Prone Hip Extension - 2 x daily - 7 x weekly - 1-2 sets - 10 reps - 3 seconds hold Supine Figure 4 Piriformis Stretch - 2 x daily - 7 x weekly - 1 sets - 4-5 reps - 20 seconds hold Sit to Stand with Armchair - 2 x daily - 7 x weekly - 1 sets - 10 reps Standing Hip Hiking - 2 x daily - 7 x weekly - 2 sets - 10 reps - 3 seconds hold

## 2021-03-29 ENCOUNTER — Encounter: Payer: PPO | Admitting: Rehabilitative and Restorative Service Providers"

## 2021-04-03 ENCOUNTER — Telehealth: Payer: Self-pay

## 2021-04-03 DIAGNOSIS — M810 Age-related osteoporosis without current pathological fracture: Secondary | ICD-10-CM

## 2021-04-03 MED ORDER — ALENDRONATE SODIUM 70 MG PO TABS: ORAL_TABLET | ORAL | 0 refills | Status: DC

## 2021-04-03 NOTE — Telephone Encounter (Signed)
Rx refill via fax

## 2021-04-05 ENCOUNTER — Encounter: Payer: PPO | Admitting: Rehabilitative and Restorative Service Providers"

## 2021-04-11 ENCOUNTER — Other Ambulatory Visit: Payer: Self-pay

## 2021-04-11 ENCOUNTER — Ambulatory Visit
Admission: RE | Admit: 2021-04-11 | Discharge: 2021-04-11 | Disposition: A | Discharge status: Home / Self Care | Payer: PPO | Source: Ambulatory Visit | Attending: Family Medicine | Admitting: Family Medicine

## 2021-04-11 DIAGNOSIS — Z780 Asymptomatic menopausal state: Secondary | ICD-10-CM | POA: Diagnosis not present

## 2021-04-11 DIAGNOSIS — M810 Age-related osteoporosis without current pathological fracture: Secondary | ICD-10-CM | POA: Diagnosis not present

## 2021-04-11 DIAGNOSIS — E2839 Other primary ovarian failure: Secondary | ICD-10-CM

## 2021-04-11 DIAGNOSIS — M85832 Other specified disorders of bone density and structure, left forearm: Secondary | ICD-10-CM | POA: Diagnosis not present

## 2021-04-23 ENCOUNTER — Other Ambulatory Visit: Payer: Self-pay

## 2021-04-23 ENCOUNTER — Encounter: Payer: Self-pay | Admitting: Family Medicine

## 2021-04-23 ENCOUNTER — Telehealth (INDEPENDENT_AMBULATORY_CARE_PROVIDER_SITE_OTHER): Payer: PPO | Admitting: Family Medicine

## 2021-04-23 VITALS — Temp 98.8°F | Wt 149.0 lb

## 2021-04-23 DIAGNOSIS — M810 Age-related osteoporosis without current pathological fracture: Secondary | ICD-10-CM

## 2021-04-23 NOTE — Progress Notes (Signed)
   Subjective:    Patient ID: Stacy Decker, female    DOB: May 25, 1951, 70 y.o.   MRN: [id number]  HPI Documentation for virtual audio and video telecommunications through [facility] encounter: The patient was located at home. 2 patient identifiers used.  The provider was located in the office. The patient did consent to this visit and is aware of possible charges through their insurance for this visit. The other persons participating in this telemedicine service were none. Time spent on call was 5 minutes and in review of previous records >18 minutes total for counseling and coordination of care. This virtual service is not related to other E/M service within previous 7 days.  This visit is to discuss her osteoporosis.  She has been on Fosamax for approximately 7 years.  She did have a DEXA scan done 2 years ago which showed -2.6 and repeat recently was -2.8.  She has also been taking a vitamin supplement.  Review of Systems     Objective:   Physical Exam        Assessment & Plan:  Osteoporosis without current pathological fracture, unspecified osteoporosis type I explained that since the Fosamax is really not stabilized her bone density, we need to move forward with Prolia.  I will start the process of getting her qualified for this.  Explained that it will be twice a year.  Encouraged her to make sure she continues on a good multivitamin with extra calcium and vitamin D.

## 2021-05-01 ENCOUNTER — Encounter: Payer: Self-pay | Admitting: Podiatry

## 2021-05-01 NOTE — Telephone Encounter (Signed)
Can you schedule her a f/u appointment for surgery booking?

## 2021-05-01 NOTE — Progress Notes (Signed)
Stacy Decker D.[place] [facility] Phone: [phone]   Assessment and Plan:     1. Costochondritis -Acute, uncomplicated, initial visit - Likely costochondritis with irritation of right ribs 4-6 laterally - No flail chest, mechanism of injury, or red flag symptoms, so no imaging obtained today - Start Tylenol 500 mg 2 tablets 3 times a day for regular pain relief and may use ibuprofen as needed for breakthrough pain    Pertinent previous records reviewed include none   Follow Up: As needed if no improvement or worsening symptoms.  Could consider x-ray at that time   Subjective:    I, Stacy Decker, am serving as a Education administrator for Doctor Glennon Mac  Chief Complaint: right side rib pain  HPI:   05/02/2021 Patient is a 70 year old female presenting with right sided rib pain going on for about a week. No MOI patient locates pain to mid back/side rib pain that will cause her to catch her breath when the pain starts.   Radiates: no Upper extremity numbness or tingling: no Aggravates: reaching, taking a deep breath Treatments tried: Ibuprofen, ice, heat   Relevant Historical Information: Osteoporosis, currently undergoing treatment for ongoing right hip pain  Additional pertinent review of systems negative.   Current Outpatient Medications:    aspirin EC 81 MG tablet, Take 81 mg by mouth daily., Disp: , Rfl:    buPROPion (WELLBUTRIN XL) 150 MG 24 hr tablet, Take 150 mg by mouth daily. , Disp: , Rfl:    busPIRone (BUSPAR) 7.5 MG tablet, Take 7.5 mg by mouth 2 (two) times daily as needed., Disp: , Rfl:    cholecalciferol (VITAMIN D) 1000 units tablet, Take 1,000 Units by mouth daily., Disp: , Rfl:    citalopram (CELEXA) 20 MG tablet, Take 20 mg daily by mouth., Disp: , Rfl:    Glucos-Chond-Sterol-Fish Oil (GLUCOSAMINE CHONDROITIN PLUS PO), Take 2 tablets by mouth daily., Disp: , Rfl:    Multiple Vitamin  (MULTIVITAMIN) tablet, Take 1 tablet by mouth daily., Disp: , Rfl:    alendronate (FOSAMAX) 70 MG tablet, Take with a full glass of water on an empty stomach. (Patient not taking: Reported on 04/23/2021), Disp: 7 tablet, Rfl: 0   ibuprofen (ADVIL) 800 MG tablet, Take 1 tablet (800 mg total) by mouth every 8 (eight) hours as needed. (Patient not taking: Reported on 02/26/2021), Disp: 30 tablet, Rfl: 2   methocarbamol (ROBAXIN) 500 MG tablet, Take 1 tablet (500 mg total) by mouth every 6 (six) hours as needed for muscle spasms. (Patient not taking: Reported on 04/23/2021), Disp: 30 tablet, Rfl: 2   Objective:     Vitals:   05/02/21 1056  BP: 110/70  Pulse: 84  SpO2: 99%  Weight: 150 lb (68 kg)  Height: 5' (1.524 m)      Body mass index is 29.29 kg/m.    Physical Exam:    General: Well-appearing, cooperative, sitting comfortably in no acute distress.  HEENT: Normocephalic, atraumatic.   Neck: No gross abnormality.  Cardiovascular: No pallor or cyanosis. Resp: Comfortable WOB.  Mild TTP over lateral ribs 4-6 on right Abdomen: Non distended.   Skin: Warm and dry; no focal rashes identified on limited exam. Extremities: No cyanosis or edema.  Neuro: Gross motor and sensory intact. Gait normal. Psychiatric: Mood and affect are appropriate.   Neuro: CN II-XII intact; strength and sensation intact generally, reflexes 2+ in upper and lower extremities  Electronically signed by:  Stacy Decker D.[facility] 12:11 PM 05/02/21

## 2021-05-02 ENCOUNTER — Other Ambulatory Visit: Payer: Self-pay

## 2021-05-02 ENCOUNTER — Ambulatory Visit: Payer: PPO | Admitting: Sports Medicine

## 2021-05-02 VITALS — BP 110/70 | HR 84 | Ht 60.0 in | Wt 150.0 lb

## 2021-05-02 DIAGNOSIS — M940 Chondrocostal junction syndrome [Tietze]: Secondary | ICD-10-CM | POA: Diagnosis not present

## 2021-05-02 NOTE — Patient Instructions (Addendum)
Good to see you  Take tylenol 500mg  2 tablets 2 times a day for regular pain relief Ibuprofen as needed for break through pain  Voltaren gel over painful areas Follow up as needed

## 2021-05-10 ENCOUNTER — Telehealth: Payer: Self-pay | Admitting: Family Medicine

## 2021-05-10 NOTE — Telephone Encounter (Signed)
Left message for pt to call concerning Prolia .  

## 2021-05-14 ENCOUNTER — Other Ambulatory Visit: Payer: Self-pay

## 2021-05-14 ENCOUNTER — Ambulatory Visit (INDEPENDENT_AMBULATORY_CARE_PROVIDER_SITE_OTHER): Payer: PPO | Admitting: Podiatry

## 2021-05-14 DIAGNOSIS — M21862 Other specified acquired deformities of left lower leg: Secondary | ICD-10-CM

## 2021-05-14 DIAGNOSIS — R2689 Other abnormalities of gait and mobility: Secondary | ICD-10-CM | POA: Diagnosis not present

## 2021-05-14 DIAGNOSIS — M76822 Posterior tibial tendinitis, left leg: Secondary | ICD-10-CM | POA: Diagnosis not present

## 2021-05-14 DIAGNOSIS — M2142 Flat foot [pes planus] (acquired), left foot: Secondary | ICD-10-CM | POA: Diagnosis not present

## 2021-05-15 NOTE — Progress Notes (Signed)
Subjective:  Patient ID: Stacy Decker, female    DOB: 05/29/1950,  MRN: [id number]  Chief Complaint  Patient presents with   Flat Foot     consult and schedule surgery -req appt time    70 y.o. female presents with the above complaint. History confirmed with patient.  She returns for follow-up for surgical planning   Objective:  Physical Exam: warm, good capillary refill, no trophic changes or ulcerative lesions, normal DP and PT pulses, and normal sensory exam. Left Foot: Severe pes planus deformity she has gastrocnemius equinus and pain of the posterior tibial tendon and sinus tarsi with calcaneal valgus on weightbearing  Radiographs: Multiple views x-ray of left foot and ankle, new calcaneal alignment view taken today previous radiographs also reviewed: She has severe pes planovalgus deformity with calcaneal valgus as well, ankle congruent  Study Result  Narrative & Impression  CLINICAL DATA:  Ankle pain and possible instability.  Remote trauma.   EXAM: MRI OF THE LEFT ANKLE WITHOUT CONTRAST   TECHNIQUE: Multiplanar, multisequence MR imaging of the ankle was performed. No intravenous contrast was administered.   COMPARISON:  12/01/2020 and 12/26/2020 radiographs   FINDINGS: TENDONS   Peroneal: Common peroneus tendon sheath tenosynovitis likely, with likely chronic laxity of the overlying peroneal retinaculum to allow for lateral deviation of the peroneal tendons given the abnormal pseudoarticulation between the distal fibular tip and the calcaneus which may preclude normal course of the peroneal tendons.   Posteromedial: Mild tibialis posterior and flexor digitorum longus tenosynovitis with mild distal tibialis posterior tendinopathy.   Anterior: Mildly thickened distal tibialis anterior tendon suggesting tendinopathy.   Achilles: Minimal Achilles tendinopathy with epicenter about 4.3 cm distal to the Achilles tendon insertion. Low-grade edema in  Kager's fat pad.   Plantar Fascia: Unremarkable.  Plantar calcaneal spur noted.   LIGAMENTS   Lateral: The inferior tibiofibular ligaments appear grossly intact. Attenuated anterior talofibular ligament, likely injured in the past. Nonvisualization of the calcaneofibular ligament.   Medial: The deltoid ligament is grossly intact. There is substantial thickening of the superomedial portion of the spring ligament along with thickening of the medioplantar oblique and ill definition of the inferoplantar longitudinal components of the spring ligament.   CARTILAGE   Ankle Joint: Adjacent foci of subcortical marrow edema along the posterolateral tibiotalar joint on image 16 series 7. Degenerative chondral thinning noted.   Subtalar Joints/Sinus Tarsi: Substantial lateral hindfoot impingement as on images 11-12 series 6, with some valgus rotation of the talocalcaneal articulation resulting in pseudoarticulation of the lateral calcaneus with the fibular tip, with intervening fluid signal as on image 19 series 7 (calcaneofibular impingement). The sinus tarsi is substantially narrowed in this case.   Bones: Notable pes planus with substantial valgus angulation at the talonavicular joint and substantial abnormally increased talonavicular coverage angle. Striking dorsal degenerative arthropathy between the medial cuneiform and the base of the first metatarsal. Lisfranc ligament seems grossly intact. Dorsal talonavicular spurring and subcortical marrow edema. Erosions versus degenerative subcortical cysts along the articulation of the cuboid with the lateral cuneiform.   Other: Mild subcutaneous edema in the ankle.   IMPRESSION: 1. Pes planus with substantial lateral hindfoot impingement and calcaneofibular impingement, as well as a markedly elevated talonavicular coverage angle. 2. Common peroneus tenosynovitis and lateral deviation of the peroneus tendons implying retinacular laxity  necessary due to the calcaneofibular impingement. 3. Relatively mild distal tibialis posterior tendinopathy along with tibialis posterior and flexor digitorum longus tenosynovitis. There is substantial thickening of the superomedial  portion of the spring ligament with thickening of the medioplantar oblique and ill definition of the inferoplantar longitudinal portions of the spring ligament. 4. Attenuated anterior talofibular ligament, possibly from a prior injury. Nonvisualization of the calcaneofibular ligament. 5. Scattered degenerative arthropathy most notably between the medial cuneiform and the base of the first metatarsal, and along the cuboid and lateral cuneiform. 6. Minimal distal Achilles tendinopathy. 7. Thickened distal tibialis anterior tendon compatible with tendinopathy.     Electronically Signed   By: Van Clines M.D.   On: 02/08/2021 16:55   Assessment:   1. Inability to bear weight   2. Acquired pes planus, left   3. Posterior tibial tendon dysfunction, left   4. Gastrocnemius equinus of left lower extremity      Plan:  Patient was evaluated and treated and all questions answered.  Today we again discussed her surgical plan in detail.  She has exhausted all conservative treatments.  Surgical we discussed the following procedures: Double, possible triple arthrodesis using autologous and autogenous bone grafting as well as platelet derived growth factor, Achilles tendon lengthening, cotton osteotomy versus Lapidus arthrodesis which will be an intraoperative decision.  We discussed the risk benefits and potential complications of this which may include but not limited to pain, swelling, infection, scar, numbness which may be temporary or permanent, chronic pain, stiffness, nerve pain or damage, wound healing problems, bone healing problems including delayed or non-union.    All questions were addressed today.  Surgery be scheduled for early February. She is  getting a knee scooter.  I dispensed an order for a bedside commode.  She thinks is a good idea as well.  Surgery will be a post operative same day observation admission for 1 night.   Surgical plan:  Procedure: -Double, possible triple arthrodesis using autologous and autogenous bone grafting as well as platelet derived growth factor, Achilles tendon lengthening, cotton osteotomy versus Lapidus arthrodesis   Location: -[facility]  Anesthesia plan: -General anesthesia with regional block  Postoperative pain plan: - Tylenol 1000 mg every 6 hours, gabapentin 300 mg every 8 hours x5 days, oxycodone 5 mg 1-2 tabs every 6 hours only as needed  DVT prophylaxis: -Xarelto 10 mg nightly  WB Restrictions / DME needs: -NWB in short leg cast or splint after surgery, she will stay overnight after surgery and have physical therapy after   No follow-ups on file.

## 2021-05-16 ENCOUNTER — Telehealth: Payer: Self-pay | Admitting: Family Medicine

## 2021-05-16 NOTE — Telephone Encounter (Signed)
Left another message for pt to call concerning Prolia.

## 2021-05-16 NOTE — Telephone Encounter (Signed)
Pt called back concerning Prolia. She states that she would like me to rerun in January to see if anything changes in the new year. I will add her to that list.

## 2021-06-01 ENCOUNTER — Telehealth: Payer: Self-pay | Admitting: Urology

## 2021-06-01 NOTE — Telephone Encounter (Signed)
DOS - 06/29/21  LAPIDUS PROCEDURE INCLUDING BUNIONECTOMY LEFT --- 81448 COTTON TARSAL OSTEOTOMY LEFT --- 18563 TRIPLE ARTHRODESIS LEFT --- 14970 TENDO - ACHILLIS LENGTH LEFT --- 26378  HTA EFFECTIVE DATE - 05/27/2018   RECEIVED FAX FROM [facility] STATING THAT CPT CODES 58850, 27741, 28786 AND 76720 HAVE BEEN APPROVED, [place] # [id number], GOOD FROM 06/29/21 - 09/27/21.

## 2021-06-12 DIAGNOSIS — F331 Major depressive disorder, recurrent, moderate: Secondary | ICD-10-CM | POA: Diagnosis not present

## 2021-06-12 DIAGNOSIS — F411 Generalized anxiety disorder: Secondary | ICD-10-CM | POA: Diagnosis not present

## 2021-06-20 ENCOUNTER — Other Ambulatory Visit: Payer: Self-pay

## 2021-06-20 ENCOUNTER — Encounter
Admission: RE | Admit: 2021-06-20 | Discharge: 2021-06-20 | Disposition: A | Discharge status: Home / Self Care | Payer: PPO | Source: Ambulatory Visit | Attending: Podiatry | Admitting: Podiatry

## 2021-06-20 NOTE — Patient Instructions (Addendum)
Your procedure is scheduled on:06-29-21 Friday Report to the Registration Desk on the 1st floor of the [facility].Then proceed to the 2nd floor Surgery Desk in the [facility] To find out your arrival time, please call [phone] between 1PM - 3PM on:06-28-21 Thursday  REMEMBER: Instructions that are not followed completely may result in serious medical risk, up to and including death; or upon the discretion of your surgeon and anesthesiologist your surgery may need to be rescheduled.  Do not eat food after midnight the night before surgery.  No gum chewing, lozengers or hard candies.  You may however, drink CLEAR liquids up to 2 hours before you are scheduled to arrive for your surgery. Do not drink anything within 2 hours of your scheduled arrival time.  Clear liquids include: - water  - apple juice without pulp - gatorade (not RED, PURPLE, OR BLUE) - black coffee or tea (Do NOT add milk or creamers to the coffee or tea) Do NOT drink anything that is not on this list.  TAKE THESE MEDICATIONS THE MORNING OF SURGERY WITH A SIP OF WATER: -buPROPion (WELLBUTRIN XL) -You may take your busPIRone (BUSPAR) the day of surgery if needed for anxiety  Stop your aspirin EC 81 MG 7 days prior to surgery-Last dose on 06-21-21 Thursday  One week prior to surgery: Stop Anti-inflammatories (NSAIDS) such as Advil, Aleve, Ibuprofen, Motrin, Naproxen, Naprosyn and Aspirin based products such as Excedrin, Goodys Powder, BC Powder.You may however, continue to take Tylenol if needed for pain up until the day of surgery.  Stop ANY OVER THE COUNTER supplements/vitamins 7 days prior to surgery (cholecalciferol (VITAMIN D), glucosamine-chondroitin, and Multiple Vitamin (MULTIVITAMIN)  No Alcohol for 24 hours before or after surgery.  No Smoking including e-cigarettes for 24 hours prior to surgery.  No chewable tobacco products for at least 6 hours prior to surgery.  No nicotine patches on the day of  surgery.  Do not use any "recreational" drugs for at least a week prior to your surgery.  Please be advised that the combination of cocaine and anesthesia may have negative outcomes, up to and including death. If you test positive for cocaine, your surgery will be cancelled.  On the morning of surgery brush your teeth with toothpaste and water, you may rinse your mouth with mouthwash if you wish. Do not swallow any toothpaste or mouthwash.  Do not wear jewelry, make-up, hairpins, clips or nail polish.  Do not wear lotions, powders, or perfumes.   Do not shave body from the neck down 48 hours prior to surgery just in case you cut yourself which could leave a site for infection.  Also, freshly shaved skin may become irritated if using the CHG soap.  Contact lenses, hearing aids and dentures may not be worn into surgery.  Do not bring valuables to the hospital. [facility] is not responsible for any missing/lost belongings or valuables.  Notify your doctor if there is any change in your medical condition (cold, fever, infection).  Wear comfortable clothing (specific to your surgery type) to the hospital.  After surgery, you can help prevent lung complications by doing breathing exercises.  Take deep breaths and cough every 1-2 hours. Your doctor may order a device called an Incentive Spirometer to help you take deep breaths. When coughing or sneezing, hold a pillow firmly against your incision with both hands. This is called splinting. Doing this helps protect your incision. It also decreases belly discomfort.  If you are being  admitted to the hospital overnight, leave your suitcase in the car. After surgery it may be brought to your room.  If you are being discharged the day of surgery, you will not be allowed to drive home. You will need a responsible adult (18 years or older) to drive you home and stay with you that night.   If you are taking public transportation, you will need  to have a responsible adult (18 years or older) with you. Please confirm with your physician that it is acceptable to use public transportation.   Please call the [facility]. at [phone] if you have any questions about these instructions.  Surgery Visitation Policy:  Patients undergoing a surgery or procedure may have one family member or support person with them as long as that person is not COVID-19 positive or experiencing its symptoms.  That person may remain in the waiting area during the procedure and may rotate out with other people.  Inpatient Visitation:    Visiting hours are 7 a.m. to 8 p.m. Up to two visitors ages 16+ are allowed at one time in a patient room. The visitors may rotate out with other people during the day. Visitors must check out when they leave, or other visitors will not be allowed. One designated support person may remain overnight. The visitor must pass COVID-19 screenings, use hand sanitizer when entering and exiting the patients room and wear a mask at all times, including in the patients room. Patients must also wear a mask when staff or their visitor are in the room. Masking is required regardless of vaccination status.

## 2021-06-21 ENCOUNTER — Encounter: Payer: Self-pay | Admitting: Family Medicine

## 2021-06-21 ENCOUNTER — Ambulatory Visit (INDEPENDENT_AMBULATORY_CARE_PROVIDER_SITE_OTHER): Payer: PPO | Admitting: Family Medicine

## 2021-06-21 VITALS — BP 138/80 | HR 82 | Temp 97.8°F | Wt 148.0 lb

## 2021-06-21 DIAGNOSIS — R7303 Prediabetes: Secondary | ICD-10-CM

## 2021-06-21 DIAGNOSIS — F419 Anxiety disorder, unspecified: Secondary | ICD-10-CM

## 2021-06-21 DIAGNOSIS — Z1152 Encounter for screening for COVID-19: Secondary | ICD-10-CM | POA: Diagnosis not present

## 2021-06-21 DIAGNOSIS — F1021 Alcohol dependence, in remission: Secondary | ICD-10-CM | POA: Insufficient documentation

## 2021-06-21 DIAGNOSIS — Z01818 Encounter for other preprocedural examination: Secondary | ICD-10-CM | POA: Diagnosis not present

## 2021-06-21 DIAGNOSIS — M810 Age-related osteoporosis without current pathological fracture: Secondary | ICD-10-CM | POA: Diagnosis not present

## 2021-06-21 DIAGNOSIS — F32A Depression, unspecified: Secondary | ICD-10-CM

## 2021-06-21 LAB — POCT GLYCOSYLATED HEMOGLOBIN (HGB A1C): Hemoglobin A1C: 5.5 % (ref 4.0–5.6)

## 2021-06-21 NOTE — H&P (View-Only) (Signed)
° °  Subjective:    Patient ID: Stacy Decker, female    DOB: Nov 28, 1950, 71 y.o.   MRN: [id number]  HPI She is here for preoperative evaluation prior to foot surgery.  She does have an underlying history of osteoporosis and is in the process of being placed on Prolia.  She did not benefit from bisphosphonates.  She also has an underlying history of anxiety and depression and is a recovering alcoholic, 29 years of alcohol free.  She has a history of prediabetes.  She has had no difficulty with chest pain, shortness of breath, PND or DOE.  She does not smoke.   Review of Systems     Objective:   Physical Exam Alert and in no distress. Tympanic membranes and canals are normal. Pharyngeal area is normal. Neck is supple without adenopathy or thyromegaly. Cardiac exam shows a regular sinus rhythm without murmurs or gallops. Lungs are clear to auscultation. EKG read by me shows a rate of 74 with other parameters being totally normal  Hemoglobin A1c is 5.5     Assessment & Plan:   Preoperative evaluation to rule out surgical contraindication  Anxiety and depression  Encounter for screening for COVID-19  Osteoporosis without current pathological fracture, unspecified osteoporosis type  Recovering alcoholic ([place])  Prediabetes No evidence of prediabetes at the present time. I congratulated her on being 29 years alcohol free. She will continue in counseling with her psychiatrist. COVID test will be ordered prior to her surgery. She is cleared medically for the surgery.

## 2021-06-21 NOTE — Progress Notes (Signed)
° °  Subjective:    Patient ID: Stacy Decker, female    DOB: 1950-10-17, 71 y.o.   MRN: [id number]  HPI She is here for preoperative evaluation prior to foot surgery.  She does have an underlying history of osteoporosis and is in the process of being placed on Prolia.  She did not benefit from bisphosphonates.  She also has an underlying history of anxiety and depression and is a recovering alcoholic, 29 years of alcohol free.  She has a history of prediabetes.  She has had no difficulty with chest pain, shortness of breath, PND or DOE.  She does not smoke.   Review of Systems     Objective:   Physical Exam Alert and in no distress. Tympanic membranes and canals are normal. Pharyngeal area is normal. Neck is supple without adenopathy or thyromegaly. Cardiac exam shows a regular sinus rhythm without murmurs or gallops. Lungs are clear to auscultation. EKG read by me shows a rate of 74 with other parameters being totally normal  Hemoglobin A1c is 5.5     Assessment & Plan:   Preoperative evaluation to rule out surgical contraindication  Anxiety and depression  Encounter for screening for COVID-19  Osteoporosis without current pathological fracture, unspecified osteoporosis type  Recovering alcoholic ([place])  Prediabetes No evidence of prediabetes at the present time. I congratulated her on being 29 years alcohol free. She will continue in counseling with her psychiatrist. COVID test will be ordered prior to her surgery. She is cleared medically for the surgery.

## 2021-06-22 ENCOUNTER — Telehealth: Payer: Self-pay | Admitting: Physician Assistant

## 2021-06-22 NOTE — Telephone Encounter (Signed)
Pt was called and advised that Prolia has been approved with estimated cost of $302.  Pt states she is having surgery 06/29/2021 and will ask surgeon if she needs before or after surgery and will call me back.

## 2021-06-25 ENCOUNTER — Telehealth: Payer: Self-pay

## 2021-06-25 NOTE — Telephone Encounter (Signed)
Patient called to check on surgical clearance form that was supposed to go to [facility] surgery coordinator @ [facility] they have informed her that has not been received yet

## 2021-06-25 NOTE — Telephone Encounter (Signed)
Done KH 

## 2021-06-27 ENCOUNTER — Other Ambulatory Visit: Payer: PPO

## 2021-06-27 ENCOUNTER — Other Ambulatory Visit (HOSPITAL_COMMUNITY)
Admission: RE | Admit: 2021-06-27 | Discharge: 2021-06-27 | Disposition: A | Discharge status: Home / Self Care | Payer: PPO | Source: Ambulatory Visit | Attending: Podiatry | Admitting: Podiatry

## 2021-06-27 DIAGNOSIS — Z01812 Encounter for preprocedural laboratory examination: Secondary | ICD-10-CM | POA: Diagnosis not present

## 2021-06-27 DIAGNOSIS — Z20822 Contact with and (suspected) exposure to covid-19: Secondary | ICD-10-CM | POA: Diagnosis not present

## 2021-06-27 DIAGNOSIS — Z01818 Encounter for other preprocedural examination: Secondary | ICD-10-CM

## 2021-06-27 LAB — SARS CORONAVIRUS 2 (TAT 6-24 HRS): SARS Coronavirus 2: NEGATIVE

## 2021-06-29 ENCOUNTER — Ambulatory Visit: Payer: PPO

## 2021-06-29 ENCOUNTER — Encounter: Payer: Self-pay | Admitting: Podiatry

## 2021-06-29 ENCOUNTER — Encounter
Admission: RE | Disposition: A | Discharge status: Home / Self Care | Payer: Self-pay | Source: Home / Self Care | Attending: Internal Medicine

## 2021-06-29 ENCOUNTER — Other Ambulatory Visit: Payer: Self-pay

## 2021-06-29 ENCOUNTER — Ambulatory Visit: Payer: PPO | Admitting: Certified Registered Nurse Anesthetist

## 2021-06-29 ENCOUNTER — Observation Stay
Admission: RE | Admit: 2021-06-29 | Discharge: 2021-07-02 | Disposition: A | Discharge status: Home / Self Care | Payer: PPO | Attending: Internal Medicine | Admitting: Internal Medicine

## 2021-06-29 DIAGNOSIS — M2140 Flat foot [pes planus] (acquired), unspecified foot: Secondary | ICD-10-CM

## 2021-06-29 DIAGNOSIS — M19072 Primary osteoarthritis, left ankle and foot: Principal | ICD-10-CM

## 2021-06-29 DIAGNOSIS — M810 Age-related osteoporosis without current pathological fracture: Secondary | ICD-10-CM | POA: Insufficient documentation

## 2021-06-29 DIAGNOSIS — Z9889 Other specified postprocedural states: Secondary | ICD-10-CM

## 2021-06-29 DIAGNOSIS — M2012 Hallux valgus (acquired), left foot: Secondary | ICD-10-CM | POA: Diagnosis not present

## 2021-06-29 DIAGNOSIS — Z79899 Other long term (current) drug therapy: Secondary | ICD-10-CM | POA: Diagnosis not present

## 2021-06-29 DIAGNOSIS — M21072 Valgus deformity, not elsewhere classified, left ankle: Secondary | ICD-10-CM | POA: Diagnosis not present

## 2021-06-29 DIAGNOSIS — Z419 Encounter for procedure for purposes other than remedying health state, unspecified: Secondary | ICD-10-CM

## 2021-06-29 DIAGNOSIS — Z87891 Personal history of nicotine dependence: Secondary | ICD-10-CM | POA: Insufficient documentation

## 2021-06-29 DIAGNOSIS — F32A Depression, unspecified: Secondary | ICD-10-CM | POA: Diagnosis not present

## 2021-06-29 DIAGNOSIS — Z981 Arthrodesis status: Secondary | ICD-10-CM | POA: Diagnosis not present

## 2021-06-29 DIAGNOSIS — Z299 Encounter for prophylactic measures, unspecified: Secondary | ICD-10-CM

## 2021-06-29 DIAGNOSIS — F419 Anxiety disorder, unspecified: Secondary | ICD-10-CM | POA: Diagnosis present

## 2021-06-29 DIAGNOSIS — Z01818 Encounter for other preprocedural examination: Secondary | ICD-10-CM | POA: Diagnosis not present

## 2021-06-29 DIAGNOSIS — R7303 Prediabetes: Secondary | ICD-10-CM | POA: Insufficient documentation

## 2021-06-29 HISTORY — PX: BUNIONECTOMY: SHX129

## 2021-06-29 HISTORY — PX: FLAT FOOT CORRECTION: SHX6619

## 2021-06-29 SURGERY — BUNIONECTOMY
Anesthesia: Monitor Anesthesia Care | Site: Foot | Laterality: Left

## 2021-06-29 MED ORDER — PROMETHAZINE HCL 25 MG PO TABS
12.5000 mg | ORAL_TABLET | ORAL | Status: DC | PRN
  Filled 2021-06-29: qty 1

## 2021-06-29 MED ORDER — HYDROCODONE-ACETAMINOPHEN 5-325 MG PO TABS: 1.0000 | ORAL_TABLET | ORAL | Status: DC | PRN

## 2021-06-29 MED ORDER — PROPOFOL 10 MG/ML IV BOLUS
INTRAVENOUS | Status: AC
  Filled 2021-06-29: qty 20

## 2021-06-29 MED ORDER — PHENYLEPHRINE HCL (PRESSORS) 10 MG/ML IV SOLN
INTRAVENOUS | Status: DC | PRN
  Administered 2021-06-29 (×2): 100 ug via INTRAVENOUS

## 2021-06-29 MED ORDER — CHLORHEXIDINE GLUCONATE 0.12 % MT SOLN: 15.0000 mL | Freq: Once | OROMUCOSAL | Status: AC

## 2021-06-29 MED ORDER — HEPARIN SODIUM (PORCINE) 5000 UNIT/ML IJ SOLN
5000.0000 [IU] | Freq: Three times a day (TID) | INTRAMUSCULAR | Status: DC
  Administered 2021-06-30: 5000 [IU] via SUBCUTANEOUS
  Filled 2021-06-29: qty 1

## 2021-06-29 MED ORDER — CEFAZOLIN SODIUM-DEXTROSE 2-4 GM/100ML-% IV SOLN
2.0000 g | INTRAVENOUS | Status: AC
  Administered 2021-06-29: 2 g via INTRAVENOUS
  Filled 2021-06-29: qty 100

## 2021-06-29 MED ORDER — LABETALOL HCL 5 MG/ML IV SOLN
INTRAVENOUS | Status: AC
  Filled 2021-06-29: qty 4

## 2021-06-29 MED ORDER — ACETAMINOPHEN 325 MG PO TABS: 650.0000 mg | ORAL_TABLET | ORAL | Status: DC | PRN

## 2021-06-29 MED ORDER — MIDAZOLAM HCL 2 MG/2ML IJ SOLN
INTRAMUSCULAR | Status: AC
  Administered 2021-06-29: 1 mg
  Filled 2021-06-29: qty 2

## 2021-06-29 MED ORDER — CEFAZOLIN SODIUM-DEXTROSE 2-4 GM/100ML-% IV SOLN: 2.0000 g | Freq: Three times a day (TID) | INTRAVENOUS | Status: DC

## 2021-06-29 MED ORDER — LACTATED RINGERS IV SOLN: INTRAVENOUS | Status: DC

## 2021-06-29 MED ORDER — OXYCODONE HCL 5 MG/5ML PO SOLN: 5.0000 mg | Freq: Once | ORAL | Status: DC | PRN

## 2021-06-29 MED ORDER — PROMETHAZINE HCL 25 MG/ML IJ SOLN: 6.2500 mg | INTRAMUSCULAR | Status: DC | PRN

## 2021-06-29 MED ORDER — CEFAZOLIN SODIUM-DEXTROSE 1-4 GM/50ML-% IV SOLN
1.0000 g | Freq: Three times a day (TID) | INTRAVENOUS | Status: AC
  Administered 2021-06-30 (×3): 1 g via INTRAVENOUS
  Filled 2021-06-29 (×3): qty 50

## 2021-06-29 MED ORDER — CEFAZOLIN SODIUM-DEXTROSE 1-4 GM/50ML-% IV SOLN
1.0000 g | Freq: Three times a day (TID) | INTRAVENOUS | Status: DC
Start: 2021-06-30 — End: 2021-06-29

## 2021-06-29 MED ORDER — LIDOCAINE HCL (CARDIAC) PF 100 MG/5ML IV SOSY
PREFILLED_SYRINGE | INTRAVENOUS | Status: DC | PRN
  Administered 2021-06-29: 30 mg via INTRAVENOUS

## 2021-06-29 MED ORDER — LIDOCAINE HCL (PF) 1 % IJ SOLN
INTRAMUSCULAR | Status: AC
  Filled 2021-06-29: qty 5

## 2021-06-29 MED ORDER — GLUCOSAMINE-CHONDROITIN 500-400 MG PO TABS: 1.0000 | ORAL_TABLET | Freq: Every day | ORAL | Status: DC

## 2021-06-29 MED ORDER — BUPIVACAINE HCL (PF) 0.5 % IJ SOLN
INTRAMUSCULAR | Status: AC
  Filled 2021-06-29: qty 30

## 2021-06-29 MED ORDER — LIDOCAINE HCL (PF) 1 % IJ SOLN
INTRAMUSCULAR | Status: DC | PRN
  Administered 2021-06-29: 1 mL via SUBCUTANEOUS

## 2021-06-29 MED ORDER — PROPOFOL 500 MG/50ML IV EMUL
INTRAVENOUS | Status: AC
  Filled 2021-06-29: qty 50

## 2021-06-29 MED ORDER — FENTANYL CITRATE PF 50 MCG/ML IJ SOSY
PREFILLED_SYRINGE | INTRAMUSCULAR | Status: AC
  Administered 2021-06-29: 50 ug
  Filled 2021-06-29: qty 1

## 2021-06-29 MED ORDER — LABETALOL HCL 5 MG/ML IV SOLN
INTRAVENOUS | Status: DC | PRN
Start: 2021-06-29 — End: 2021-06-29
  Administered 2021-06-29: 5 mg via INTRAVENOUS

## 2021-06-29 MED ORDER — LIDOCAINE HCL (PF) 2 % IJ SOLN
INTRAMUSCULAR | Status: AC
  Filled 2021-06-29: qty 5

## 2021-06-29 MED ORDER — OXYCODONE HCL 5 MG PO TABS: 5.0000 mg | ORAL_TABLET | Freq: Once | ORAL | Status: DC | PRN

## 2021-06-29 MED ORDER — 0.9 % SODIUM CHLORIDE (POUR BTL) OPTIME
TOPICAL | Status: DC | PRN
  Administered 2021-06-29: 1000 mL

## 2021-06-29 MED ORDER — FENTANYL CITRATE (PF) 100 MCG/2ML IJ SOLN: 25.0000 ug | INTRAMUSCULAR | Status: DC | PRN

## 2021-06-29 MED ORDER — CHLORHEXIDINE GLUCONATE 0.12 % MT SOLN
OROMUCOSAL | Status: AC
  Administered 2021-06-29: 15 mL via OROMUCOSAL
  Filled 2021-06-29: qty 15

## 2021-06-29 MED ORDER — MORPHINE SULFATE (PF) 2 MG/ML IV SOLN: 2.0000 mg | INTRAVENOUS | Status: DC | PRN

## 2021-06-29 MED ORDER — BUPIVACAINE HCL (PF) 0.5 % IJ SOLN
INTRAMUSCULAR | Status: DC | PRN
  Administered 2021-06-29: 10 mL via PERINEURAL
  Administered 2021-06-29: 20 mL via PERINEURAL

## 2021-06-29 MED ORDER — ACETAMINOPHEN 10 MG/ML IV SOLN: 1000.0000 mg | Freq: Once | INTRAVENOUS | Status: DC | PRN

## 2021-06-29 MED ORDER — BUPROPION HCL ER (XL) 150 MG PO TB24
150.0000 mg | ORAL_TABLET | ORAL | Status: DC
  Administered 2021-06-30 – 2021-07-02 (×3): 150 mg via ORAL
  Filled 2021-06-29 (×3): qty 1

## 2021-06-29 MED ORDER — SODIUM CHLORIDE 0.9 % IV SOLN: 250.0000 mL | INTRAVENOUS | Status: DC | PRN

## 2021-06-29 MED ORDER — CEFAZOLIN SODIUM 1 G IJ SOLR
INTRAMUSCULAR | Status: AC
  Filled 2021-06-29: qty 10

## 2021-06-29 MED ORDER — FAMOTIDINE 20 MG PO TABS
ORAL_TABLET | ORAL | Status: AC
  Administered 2021-06-29: 20 mg via ORAL
  Filled 2021-06-29: qty 1

## 2021-06-29 MED ORDER — ASPIRIN EC 81 MG PO TBEC
81.0000 mg | DELAYED_RELEASE_TABLET | Freq: Every day | ORAL | Status: DC
  Administered 2021-06-30: 81 mg via ORAL
  Filled 2021-06-29 (×3): qty 1

## 2021-06-29 MED ORDER — OYSTER SHELL CALCIUM/D3 500-5 MG-MCG PO TABS
2.0000 | ORAL_TABLET | Freq: Every day | ORAL | Status: DC
  Administered 2021-06-30: 2 via ORAL
  Filled 2021-06-29 (×4): qty 2

## 2021-06-29 MED ORDER — ADULT MULTIVITAMIN W/MINERALS CH
1.0000 | ORAL_TABLET | Freq: Every day | ORAL | Status: DC
  Administered 2021-06-30: 1 via ORAL
  Filled 2021-06-29 (×4): qty 1

## 2021-06-29 MED ORDER — MIDAZOLAM HCL 2 MG/2ML IJ SOLN
INTRAMUSCULAR | Status: AC
  Filled 2021-06-29: qty 2

## 2021-06-29 MED ORDER — ORAL CARE MOUTH RINSE: 15.0000 mL | Freq: Once | OROMUCOSAL | Status: AC

## 2021-06-29 MED ORDER — PROPOFOL 500 MG/50ML IV EMUL
INTRAVENOUS | Status: DC | PRN
  Administered 2021-06-29 (×2): 50 mg via INTRAVENOUS
  Administered 2021-06-29: 25 ug/kg/min via INTRAVENOUS

## 2021-06-29 MED ORDER — CITALOPRAM HYDROBROMIDE 10 MG PO TABS
40.0000 mg | ORAL_TABLET | Freq: Every evening | ORAL | Status: DC
  Administered 2021-06-29 – 2021-07-01 (×3): 40 mg via ORAL
  Filled 2021-06-29 (×3): qty 4

## 2021-06-29 MED ORDER — OXYCODONE-ACETAMINOPHEN 5-325 MG PO TABS
1.0000 | ORAL_TABLET | ORAL | Status: DC | PRN
  Filled 2021-06-29: qty 1

## 2021-06-29 MED ORDER — SODIUM CHLORIDE 0.9% FLUSH: 3.0000 mL | INTRAVENOUS | Status: DC | PRN

## 2021-06-29 MED ORDER — SODIUM CHLORIDE 0.9% FLUSH
3.0000 mL | Freq: Two times a day (BID) | INTRAVENOUS | Status: DC
  Administered 2021-06-29 – 2021-07-02 (×6): 3 mL via INTRAVENOUS

## 2021-06-29 MED ORDER — FENTANYL CITRATE (PF) 100 MCG/2ML IJ SOLN
INTRAMUSCULAR | Status: AC
  Filled 2021-06-29: qty 2

## 2021-06-29 MED ORDER — MIDAZOLAM HCL 2 MG/2ML IJ SOLN
INTRAMUSCULAR | Status: DC | PRN
  Administered 2021-06-29: 2 mg via INTRAVENOUS

## 2021-06-29 MED ORDER — MORPHINE SULFATE (PF) 4 MG/ML IV SOLN
4.0000 mg | INTRAVENOUS | Status: DC | PRN
  Administered 2021-06-30: 4 mg via INTRAVENOUS
  Filled 2021-06-29: qty 1

## 2021-06-29 MED ORDER — FAMOTIDINE 20 MG PO TABS: 20.0000 mg | ORAL_TABLET | Freq: Once | ORAL | Status: AC

## 2021-06-29 MED ORDER — FENTANYL CITRATE (PF) 100 MCG/2ML IJ SOLN
INTRAMUSCULAR | Status: DC | PRN
  Administered 2021-06-29: 50 ug via INTRAVENOUS
  Administered 2021-06-29 (×2): 25 ug via INTRAVENOUS

## 2021-06-29 MED ORDER — BUSPIRONE HCL 15 MG PO TABS
7.5000 mg | ORAL_TABLET | Freq: Two times a day (BID) | ORAL | Status: DC | PRN
  Administered 2021-07-02: 7.5 mg via ORAL
  Filled 2021-06-29 (×2): qty 1

## 2021-06-29 MED ORDER — CEFAZOLIN SODIUM-DEXTROSE 1-4 GM/50ML-% IV SOLN
INTRAVENOUS | Status: DC | PRN
  Administered 2021-06-29: 1 g via INTRAVENOUS

## 2021-06-29 SURGICAL SUPPLY — 99 items
2.0 unthreaded wire ×2 IMPLANT
3.5 cannulated drill bit ×1 IMPLANT
BASIN GRAD PLASTIC 32OZ STRL (MISCELLANEOUS) ×2 IMPLANT
BIT DRILL 12.7X2.7STRG SHNK (BIT) IMPLANT
BIT DRILL 2.7 (BIT) ×3
BIT DRILL CANN 3.5 LRG (BIT) ×1 IMPLANT
BIT DRILL CANN 4.9 LRG AO FIT (BIT) IMPLANT
BIT DRL 12.7X2.7STRG SHNK (BIT) ×2
BLADE MED AGGRESSIVE (BLADE) ×2 IMPLANT
BLADE OSCILLATING/SAGITTAL (BLADE) ×3
BLADE SURG 15 STRL LF DISP TIS (BLADE) IMPLANT
BLADE SURG 15 STRL SS (BLADE) ×15
BLADE SURG MINI STRL (BLADE) ×2 IMPLANT
BLADE SW THK.38XMED LNG THN (BLADE) IMPLANT
BNDG CMPR STD VLCR NS LF 5.8X4 (GAUZE/BANDAGES/DRESSINGS)
BNDG CMPR STD VLCR NS LF 5.8X6 (GAUZE/BANDAGES/DRESSINGS)
BNDG COHESIVE 4X5 TAN ST LF (GAUZE/BANDAGES/DRESSINGS) ×3 IMPLANT
BNDG ELASTIC 4X5.8 VLCR NS LF (GAUZE/BANDAGES/DRESSINGS) ×2 IMPLANT
BNDG ELASTIC 6X5.8 VLCR NS LF (GAUZE/BANDAGES/DRESSINGS) ×2 IMPLANT
BNDG ESMARK 4X12 TAN STRL LF (GAUZE/BANDAGES/DRESSINGS) ×3 IMPLANT
BNDG GAUZE ELAST 4 BULKY (GAUZE/BANDAGES/DRESSINGS) ×4 IMPLANT
BONE CANC CHIPS 20CC PCAN1/4 (Bone Implant) ×3 IMPLANT
CHIPS CANC BONE 20CC PCAN1/4 (Bone Implant) ×2 IMPLANT
COVER LIGHT HANDLE STERIS (MISCELLANEOUS) ×6 IMPLANT
COVER PIN YLW 0.028-062 (MISCELLANEOUS) ×4 IMPLANT
CUFF TOURN SGL QUICK 18X4 (TOURNIQUET CUFF) ×2 IMPLANT
DRAPE C-ARM XRAY 36X54 (DRAPES) ×1 IMPLANT
DRAPE C-ARMOR (DRAPES) ×1 IMPLANT
DRAPE FLUOR MINI C-ARM 54X84 (DRAPES) ×3 IMPLANT
DRILL CANNULATED 4.9 (BIT) ×3
DRSG TEGADERM 4X4.75 (GAUZE/BANDAGES/DRESSINGS) ×2 IMPLANT
DURAPREP 26ML APPLICATOR (WOUND CARE) ×3 IMPLANT
ELECT REM PT RETURN 9FT ADLT (ELECTROSURGICAL) ×3
ELECTRODE REM PT RTRN 9FT ADLT (ELECTROSURGICAL) ×2 IMPLANT
GAUZE 4X4 16PLY ~~LOC~~+RFID DBL (SPONGE) ×1 IMPLANT
GAUZE SPONGE 4X4 12PLY STRL (GAUZE/BANDAGES/DRESSINGS) ×4 IMPLANT
GAUZE XEROFORM 1X8 LF (GAUZE/BANDAGES/DRESSINGS) ×3 IMPLANT
GLOVE SURG ENC MOIS LTX SZ7 (GLOVE) ×3 IMPLANT
GLOVE SURG UNDER POLY LF SZ7 (GLOVE) ×3 IMPLANT
GOWN STRL REUS W/ TWL LRG LVL3 (GOWN DISPOSABLE) ×4 IMPLANT
GOWN STRL REUS W/TWL LRG LVL3 (GOWN DISPOSABLE) ×6
GRAFT BNE CANC CHIPS 1-8 20CC (Bone Implant) IMPLANT
GUIDEWIRE UNTHREAD 3.2X150 (WIRE) ×3 IMPLANT
GUIDEWIRE UNTHREADED 2.0X150 (WIRE) ×2 IMPLANT
KIT INJECTABLE AUGMENT 1.5 (Tissue) ×1 IMPLANT
KIT INSTR EASYFUSE HINDFOOT (INSTRUMENTS) ×1 IMPLANT
KIT TURNOVER KIT A (KITS) ×3 IMPLANT
MANIFOLD NEPTUNE II (INSTRUMENTS) ×3 IMPLANT
NDL FILTER BLUNT 18X1 1/2 (NEEDLE) ×2 IMPLANT
NEEDLE FILTER BLUNT 18X 1/2SAF (NEEDLE)
NEEDLE FILTER BLUNT 18X1 1/2 (NEEDLE) IMPLANT
NS IRRIG 1000ML POUR BTL (IV SOLUTION) ×1 IMPLANT
NS IRRIG 500ML POUR BTL (IV SOLUTION) ×2 IMPLANT
PACK EXTREMITY ARMC (MISCELLANEOUS) ×3 IMPLANT
PAD ABD DERMACEA PRESS 5X9 (GAUZE/BANDAGES/DRESSINGS) ×3 IMPLANT
PAD CAST CTTN 4X4 STRL (SOFTGOODS) IMPLANT
PADDING CAST BLEND 4X4 NS (MISCELLANEOUS) ×8 IMPLANT
PADDING CAST COTTON 4X4 STRL (SOFTGOODS) ×3
PENCIL SMOKE EVACUATOR (MISCELLANEOUS) ×3 IMPLANT
SCREW COMPR HDLS 5.0X32 (Screw) ×1 IMPLANT
SCREW COMPR HDLS PT 5.0X44 (Screw) ×1 IMPLANT
SCREW COMPR HDLS SHRT 7.0X70 (Screw) ×1 IMPLANT
SCREW COMPR HDLS SHRT 7.0X75 (Screw) ×1 IMPLANT
SCREW HEADLESS COMP 5.0X60 (Screw) ×1 IMPLANT
SPLINT CAST 1 STEP 4X30 (MISCELLANEOUS) ×2 IMPLANT
SPLINT FAST PLASTER 5X30 (CAST SUPPLIES)
SPLINT PLASTER CAST FAST 5X30 (CAST SUPPLIES) ×2 IMPLANT
SPONGE T-LAP 18X18 ~~LOC~~+RFID (SPONGE) ×1 IMPLANT
STAPLE COMPR EASYFUSE 20X15 (Staple) ×2 IMPLANT
STAPLE COMPR EASYFUSE 25X20 (Staple) ×1 IMPLANT
STAPLE COMPR EASYFUSE 25X25 (Staple) ×1 IMPLANT
STAPLER SKIN PROX 35W (STAPLE) ×1 IMPLANT
STOCKINETTE IMPERVIOUS 9X36 MD (GAUZE/BANDAGES/DRESSINGS) ×3 IMPLANT
STOCKINETTE STRL 6IN 960660 (GAUZE/BANDAGES/DRESSINGS) ×2 IMPLANT
STRIP CLOSURE SKIN 1/4X4 (GAUZE/BANDAGES/DRESSINGS) ×3 IMPLANT
SUT ETHILON 3-0 (SUTURE) ×2 IMPLANT
SUT ETHILON 4 0 PS 2 18 (SUTURE) ×2 IMPLANT
SUT ETHILON 4-0 (SUTURE) ×3
SUT ETHILON 4-0 FS2 18XMFL BLK (SUTURE) ×2
SUT MNCRL AB 3-0 PS2 27 (SUTURE) ×2 IMPLANT
SUT MNCRL+ 5-0 UNDYED PC-3 (SUTURE) IMPLANT
SUT MONOCRYL 5-0 (SUTURE)
SUT VIC AB 0 CT1 27 (SUTURE)
SUT VIC AB 0 CT1 27XCR 8 STRN (SUTURE) IMPLANT
SUT VIC AB 2-0 SH 27 (SUTURE) ×3
SUT VIC AB 2-0 SH 27XBRD (SUTURE) IMPLANT
SUT VIC AB 3-0 54X BRD REEL (SUTURE) IMPLANT
SUT VIC AB 3-0 BRD 54 (SUTURE) ×3
SUT VIC AB 3-0 SH 27 (SUTURE) ×3
SUT VIC AB 3-0 SH 27X BRD (SUTURE) IMPLANT
SUT VIC AB 4-0 FS2 27 (SUTURE) ×4 IMPLANT
SUT VIC AB 4-0 SH 27 (SUTURE) ×3
SUT VIC AB 4-0 SH 27XANBCTRL (SUTURE) IMPLANT
SUT VICRYL AB 3-0 FS1 BRD 27IN (SUTURE) ×2 IMPLANT
SUTURE ETHLN 4-0 FS2 18XMF BLK (SUTURE) ×2 IMPLANT
SYR 10ML LL (SYRINGE) ×4 IMPLANT
SYR 3ML LL SCALE MARK (SYRINGE) ×2 IMPLANT
WATER STERILE IRR 500ML POUR (IV SOLUTION) ×3 IMPLANT
WIRE Z .062 C-WIRE SPADE TIP (WIRE) ×4 IMPLANT

## 2021-06-29 NOTE — Assessment & Plan Note (Deleted)
Heparin 24 hours post surgery.

## 2021-06-29 NOTE — Transfer of Care (Signed)
Immediate Anesthesia Transfer of Care Note  Patient: Stacy Decker  Procedure(s) Performed: BUNIONECTOMY (Left: Foot) FLAT FOOT CORRECTION (Left)  Patient Location: PACU  Anesthesia Type:General  Level of Consciousness: drowsy and patient cooperative  Airway & Oxygen Therapy: Patient Spontanous Breathing  Post-op Assessment: Report given to RN and Post -op Vital signs reviewed and stable  Post vital signs: Reviewed and stable  Last Vitals:  Vitals Value Taken Time  BP 115/49 06/29/21 1836  Temp 36.4 C 06/29/21 1836  Pulse 63 06/29/21 1842  Resp 15 06/29/21 1842  SpO2 95 % 06/29/21 1842  Vitals shown include unvalidated device data.  Last Pain:  Vitals:   06/29/21 1126  TempSrc: Temporal  PainSc: 2          Complications: No notable events documented.

## 2021-06-29 NOTE — Anesthesia Preprocedure Evaluation (Addendum)
Anesthesia Evaluation  Patient identified by MRN, date of birth, ID band Patient awake    Reviewed: Allergy & Precautions, NPO status , Patient's Chart, lab work & pertinent test results  Airway Mallampati: III  TM Distance: >3 FB Neck ROM: full    Dental no notable dental hx.    Pulmonary neg pulmonary ROS, former smoker,    Pulmonary exam normal        Cardiovascular Exercise Tolerance: Good negative cardio ROS Normal cardiovascular exam     Neuro/Psych PSYCHIATRIC DISORDERS Anxiety Depression negative neurological ROS     GI/Hepatic negative GI ROS, Neg liver ROS,   Endo/Other  negative endocrine ROS  Renal/GU negative Renal ROS  negative genitourinary   Musculoskeletal   Abdominal Normal abdominal exam  (+)   Peds  Hematology negative hematology ROS (+)   Anesthesia Other Findings Past Medical History: No date: Anxiety No date: Depression No date: History of vertigo No date: Osteoporosis No date: Substance abuse ([place])     Comment:  recovering alcoholic-sober since 1610  Past Surgical History: No date: AUGMENTATION MAMMAPLASTY; Bilateral No date: carpel tunnel; Bilateral No date: COLONOSCOPY No date: PLACEMENT OF BREAST IMPLANTS     Reproductive/Obstetrics negative OB ROS                            Anesthesia Physical Anesthesia Plan  ASA: 2  Anesthesia Plan: MAC and Regional   Post-op Pain Management: Regional block   Induction: Intravenous  PONV Risk Score and Plan: Propofol infusion, TIVA, Dexamethasone and Ondansetron  Airway Management Planned: Natural Airway and Nasal Cannula  Additional Equipment:   Intra-op Plan:   Post-operative Plan:   Informed Consent: I have reviewed the patients History and Physical, chart, labs and discussed the procedure including the risks, benefits and alternatives for the proposed anesthesia with the patient or authorized  representative who has indicated his/her understanding and acceptance.     Dental Advisory Given  Plan Discussed with: Anesthesiologist, CRNA and Surgeon  Anesthesia Plan Comments: (Patient consented for risks of anesthesia including but not limited to:  - adverse reactions to medications - risk of airway placement if required - damage to eyes, teeth, lips or other oral mucosa - nerve damage due to positioning  - sore throat or hoarseness - Damage to heart, brain, nerves, lungs, other parts of body or loss of life  Patient voiced understanding.)       Anesthesia Quick Evaluation

## 2021-06-29 NOTE — Assessment & Plan Note (Addendum)
On Celexa and BuSpar.

## 2021-06-29 NOTE — Anesthesia Procedure Notes (Signed)
Anesthesia Regional Block: Popliteal block   Pre-Anesthetic Checklist: , timeout performed,  Correct Patient, Correct Site, Correct Laterality,  Correct Procedure, Correct Position, site marked,  Risks and benefits discussed,  Surgical consent,  Pre-op evaluation,  At surgeon's request and post-op pain management  Laterality: Left  Prep: chloraprep       Needles:  Injection technique: Single-shot  Needle Type: Stimiplex     Needle Length: 9cm  Needle Gauge: 22     Additional Needles:   Procedures:,,,, ultrasound used (permanent image in chart),,    Narrative:  Start time: 06/29/2021 1:13 PM End time: 06/29/2021 1:16 PM Injection made incrementally with aspirations every 20 mL.  Performed by: Personally  Anesthesiologist: Piscitello, Precious Haws, MD  Additional Notes: Patient consented for risk and benefits of nerve block including but not limited to nerve damage, failed block, bleeding and infection.  Patient voiced understanding.  Functioning IV was confirmed and monitors were applied.  Timeout done prior to procedure and prior to any sedation being given to the patient.  Patient confirmed procedure site prior to any sedation given to the patient.  A 59mm 22ga Stimuplex needle was used. Sterile prep,hand hygiene and sterile gloves were used.  Minimal sedation used for procedure.  No paresthesia endorsed by patient during the procedure.  Negative aspiration and negative test dose prior to incremental administration of local anesthetic. The patient tolerated the procedure well with no immediate complications.

## 2021-06-29 NOTE — Anesthesia Procedure Notes (Signed)
Anesthesia Regional Block: Adductor canal block   Pre-Anesthetic Checklist: , timeout performed,  Correct Patient, Correct Site, Correct Laterality,  Correct Procedure, Correct Position, site marked,  Risks and benefits discussed,  Surgical consent,  Pre-op evaluation,  At surgeon's request and post-op pain management  Laterality: Left  Prep: chloraprep       Needles:  Injection technique: Single-shot  Needle Type: Stimiplex     Needle Length: 9cm  Needle Gauge: 22     Additional Needles:   Procedures:,,,, ultrasound used (permanent image in chart),,    Narrative:  Start time: 06/29/2021 1:16 PM End time: 06/29/2021 1:18 PM Injection made incrementally with aspirations every 10 mL.  Performed by: Personally  Anesthesiologist: Iran Ouch, MD  Additional Notes: Patient consented for risk and benefits of nerve block including but not limited to nerve damage, failed block, bleeding and infection.  Patient voiced understanding.  Functioning IV was confirmed and monitors were applied.  Timeout done prior to procedure and prior to any sedation being given to the patient.  Patient confirmed procedure site prior to any sedation given to the patient.  A 45mm 22ga Stimuplex needle was used. Sterile prep,hand hygiene and sterile gloves were used.  Minimal sedation used for procedure.  No paresthesia endorsed by patient during the procedure.  Negative aspiration and negative test dose prior to incremental administration of local anesthetic. The patient tolerated the procedure well with no immediate complications.

## 2021-06-29 NOTE — Assessment & Plan Note (Deleted)
Last a1c is 5.3 do not suspect diabetes.

## 2021-06-29 NOTE — Interval H&P Note (Signed)
History and Physical Interval Note:  06/29/2021 12:28 PM  [place]  has presented today for surgery, with the diagnosis of [place].  The various methods of treatment have been discussed with the patient and family. After consideration of risks, benefits and other options for treatment, the patient has consented to  Procedure(s) with comments: BUNIONECTOMY (Left) - [place] (Left) as a surgical intervention.  The patient's history has been reviewed, patient examined, no change in status, stable for surgery.  I have reviewed the patient's chart and labs.  Questions were answered to the patient's satisfaction.     Criselda Peaches

## 2021-06-29 NOTE — Brief Op Note (Signed)
06/29/2021  6:38 PM  PATIENT:  Stacy Decker  71 y.o. female  PRE-OPERATIVE DIAGNOSIS:  OSTEOARTHRITIS OF THE FOOT,PES PLANUS HALLUX ABDUCTUS VALGUS  POST-OPERATIVE DIAGNOSIS:  OSTEOARTHRITIS OF THE FOOT,PES PLANUS HALLUX ABDUCTUS VALGUS  PROCEDURE:  Procedure(s) with comments: BUNIONECTOMY (Left) - POPLITEAL AND SAPHENOUS BLOCK FLAT FOOT CORRECTION (Left)  SURGEON:  Surgeon(s) and Role:    * Nyra Anspaugh, Stephan Minister, DPM - Primary    * Trula Slade, DPM  PHYSICIAN ASSISTANT:   ASSISTANTS: Celesta Gentile DPM   ANESTHESIA:   regional and IV sedation  EBL:  30cc   BLOOD ADMINISTERED:none  DRAINS: none   LOCAL MEDICATIONS USED:  NONE  SPECIMEN:  No Specimen  DISPOSITION OF SPECIMEN:  N/A  COUNTS:  YES  TOURNIQUET:   Total Tourniquet Time Documented: Thigh (Left) - 120 minutes Thigh (Left) - 76 minutes Total: Thigh (Left) - 196 minutes   DICTATION: .Note written in EPIC  PLAN OF CARE: Admit for overnight observation  PATIENT DISPOSITION:  PACU - hemodynamically stable.   Delay start of Pharmacological VTE agent (>24hrs) due to surgical blood loss or risk of bleeding: no

## 2021-06-29 NOTE — Plan of Care (Signed)

## 2021-06-29 NOTE — Anesthesia Postprocedure Evaluation (Signed)
Anesthesia Post Note  Patient: Stacy Decker  Procedure(s) Performed: BUNIONECTOMY (Left: Foot) FLAT FOOT CORRECTION (Left)  Patient location during evaluation: PACU Anesthesia Type: Regional Level of consciousness: awake and alert Pain management: pain level controlled Vital Signs Assessment: post-procedure vital signs reviewed and stable Respiratory status: spontaneous breathing, nonlabored ventilation, respiratory function stable and patient connected to nasal cannula oxygen Cardiovascular status: blood pressure returned to baseline and stable Postop Assessment: no apparent nausea or vomiting Anesthetic complications: no   No notable events documented.   Last Vitals:  Vitals:   06/29/21 1900 06/29/21 1915  BP: (!) 132/57 (!) 147/62  Pulse: 61 63  Resp: 16 14  Temp: 36.4 C   SpO2: 94% 94%    Last Pain:  Vitals:   06/29/21 1900  TempSrc:   PainSc: 0-No pain                 Martha Clan

## 2021-06-29 NOTE — Consult Note (Signed)
Initial Consultation Note   Patient: Stacy Decker PRF:[id number] DOB: 1950-12-05 PCP: Girtha Rm, PA-C DOA: 06/29/2021 DOS: the patient was seen and examined on 06/29/2021 Primary service: Para Skeans, MD  Referring physician: Orvilla Cornwall- podiatry.  Reason for consult: Medical management.  Assessment/Plan: Assessment and Plan: Prediabetes- (present on admission) Last a1c is 5.3 do not suspect diabetes.    Anxiety and depression- (present on admission) Continue buspar/ citalopram.   DVT prophylaxis Heparin 24 hours post surgery.      TRH will sign off at present, please call us again when needed.  HPI: Stacy Decker is a 71 y.o. female with past medical history of left foot pain for several years who is post op today and consult request for medical management.    Review of Systems:  Review of Systems  Constitutional: Negative.   HENT: Negative.    Eyes: Negative.   Respiratory: Negative.    Cardiovascular: Negative.   Gastrointestinal: Negative.   Musculoskeletal: Negative.   All other systems reviewed and are negative.  Past Medical History:  Diagnosis Date   Anxiety    Depression    History of vertigo    Osteoporosis    Substance abuse ([facility])    recovering alcoholic-sober since 9357   Past Surgical History:  Procedure Laterality Date   AUGMENTATION MAMMAPLASTY Bilateral    carpel tunnel Bilateral    COLONOSCOPY     PLACEMENT OF BREAST IMPLANTS     Social History:  reports that she quit smoking about 25 years ago. Her smoking use included cigarettes. She has a 37.50 pack-year smoking history. She has never used smokeless tobacco. She reports that she does not drink alcohol and does not use drugs.  No Known Allergies  Family History  Problem Relation Age of Onset   Hypertension Mother    Cancer Mother        breast in remission age 68   Diabetes Mother    Diabetes Paternal Grandmother    Dementia Father        UTIs    Other  Father        brain tumor   Other Sister        hip replacements   Alcohol abuse Sister    Other Sister        breast cysts- benign   Colon cancer Neg Hx    Stomach cancer Neg Hx    Rectal cancer Neg Hx    Esophageal cancer Neg Hx    Liver cancer Neg Hx    Breast cancer Neg Hx    BRCA 1/2 Neg Hx     Prior to Admission medications   Medication Sig Start Date End Date Taking? Authorizing Provider  acetaminophen (TYLENOL) 650 MG CR tablet Take 1,300 mg by mouth every 8 (eight) hours as needed for pain.   Yes [provider]  aspirin EC 81 MG tablet Take 81 mg by mouth daily.   Yes [provider]  buPROPion (WELLBUTRIN XL) 150 MG 24 hr tablet Take 150 mg by mouth every morning.   Yes [provider]  busPIRone (BUSPAR) 7.5 MG tablet Take 7.5 mg by mouth 2 (two) times daily as needed (anxiety). 08/25/20  Yes [provider]  Calcium Carb-Cholecalciferol (CALCIUM 1000 + D) 1000-20 MG-MCG TABS  05/30/21  Yes [provider]  cholecalciferol (VITAMIN D) 1000 units tablet Take 1,000 Units by mouth daily.   Yes [provider]  citalopram (CELEXA) 40 MG  tablet Take 40 mg by mouth every evening.   Yes [provider]  Multiple Vitamin (MULTIVITAMIN) tablet Take 1 tablet by mouth daily.   Yes [provider]  glucosamine-chondroitin 500-400 MG tablet Take 1 tablet by mouth daily.    [provider]    Physical Exam: Vitals:   06/29/21 1845 06/29/21 1900 06/29/21 1915 06/29/21 2009  BP: (!) 136/55 (!) 132/57 (!) 147/62 (!) 146/52  Pulse: 63 61 63 65  Resp: 13 16 14 16   Temp:  97.6 F (36.4 C)  (!) 97.5 F (36.4 C)  TempSrc:      SpO2: 96% 94% 94% 97%  Weight:      Height:      Pt is alert and awake,post op , doing well. No complaints and labs reviewed and pt is stable.  We will sign off please let us know if any questions.    Family Communication: Rutherford Limerick (Sister)  [phone] [facility] Phone) Primary  team communication: Dr> Mcdonald.  Thank you very much for involving [place] in the care of your patient.  Author: Para Skeans, MD 06/29/2021 8:51 PM  For on call review [email or web address].

## 2021-06-29 NOTE — Progress Notes (Signed)
PHARMACIST - PHYSICIAN ORDER COMMUNICATION  CONCERNING: P&T Medication Policy on Herbal Medications  DESCRIPTION:  This patients order for:  glucosamine  has been noted.  This product(s) is classified as an herbal or natural product. Due to a lack of definitive safety studies or FDA approval, nonstandard manufacturing practices, plus the potential risk of unknown drug-drug interactions while on inpatient medications, the Pharmacy and Therapeutics Committee does not permit the use of herbal or natural products of this type within [facility].   ACTION TAKEN: The pharmacy department is unable to verify this order at this time and your patient has been informed of this safety policy. Please reevaluate patients clinical condition at discharge and address if the herbal or natural product(s) should be resumed at that time.   Cheral Cappucci Rodriguez-Guzman PharmD, BCPS 06/29/2021 8:14 PM

## 2021-06-30 DIAGNOSIS — F419 Anxiety disorder, unspecified: Secondary | ICD-10-CM | POA: Diagnosis not present

## 2021-06-30 DIAGNOSIS — F32A Depression, unspecified: Secondary | ICD-10-CM | POA: Diagnosis not present

## 2021-06-30 DIAGNOSIS — M2012 Hallux valgus (acquired), left foot: Secondary | ICD-10-CM | POA: Diagnosis not present

## 2021-06-30 DIAGNOSIS — M19072 Primary osteoarthritis, left ankle and foot: Secondary | ICD-10-CM

## 2021-06-30 DIAGNOSIS — M2140 Flat foot [pes planus] (acquired), unspecified foot: Secondary | ICD-10-CM

## 2021-06-30 DIAGNOSIS — Z9889 Other specified postprocedural states: Secondary | ICD-10-CM | POA: Diagnosis not present

## 2021-06-30 MED ORDER — RIVAROXABAN 10 MG PO TABS
10.0000 mg | ORAL_TABLET | Freq: Every day | ORAL | Status: DC
  Administered 2021-06-30 – 2021-07-01 (×2): 10 mg via ORAL
  Filled 2021-06-30 (×3): qty 1

## 2021-06-30 MED ORDER — GABAPENTIN 300 MG PO CAPS
300.0000 mg | ORAL_CAPSULE | Freq: Three times a day (TID) | ORAL | Status: DC
  Administered 2021-06-30 – 2021-07-02 (×6): 300 mg via ORAL
  Filled 2021-06-30 (×6): qty 1

## 2021-06-30 MED ORDER — OXYCODONE HCL 5 MG PO TABS
10.0000 mg | ORAL_TABLET | ORAL | Status: DC | PRN
Start: 2021-06-30 — End: 2021-07-02

## 2021-06-30 MED ORDER — OXYCODONE HCL 5 MG PO TABS
5.0000 mg | ORAL_TABLET | ORAL | Status: DC | PRN
Start: 2021-06-30 — End: 2021-07-02
  Administered 2021-07-01 – 2021-07-02 (×2): 5 mg via ORAL
  Filled 2021-06-30 (×2): qty 1

## 2021-06-30 MED ORDER — ACETAMINOPHEN 500 MG PO TABS
1000.0000 mg | ORAL_TABLET | Freq: Four times a day (QID) | ORAL | Status: DC
  Administered 2021-06-30 – 2021-07-02 (×8): 1000 mg via ORAL
  Filled 2021-06-30 (×8): qty 2

## 2021-06-30 NOTE — Progress Notes (Signed)
Subjective:  Patient ID: Stacy Decker, female    DOB: June 07, 1950,  MRN: [id number]  POD #1 status post Lapidus bunionectomy, double arthrodesis left foot  At time of exam she is feeling better pain is under control now 2 out of 10 she says.  Was quite severe when the block wore off about 6:00 this morning.  Has had some bleeding in the bandages as well.  Other than pain feels well.  Says that she did not have much elevation or ice overnight  Negative for chest pain and shortness of breath Fever: no Night sweats: no Constitutional signs: no Review of all other systems is negative Objective:   Vitals:   06/30/21 0804 06/30/21 1131  BP: 130/77 128/74  Pulse: 98 87  Resp: 18 16  Temp: 99.1 F (37.3 C) 98.7 F (37.1 C)  SpO2: 97% 93%   General AA&O x3. Normal mood and affect.  Vascular Dorsalis pedis and posterior tibial pulses 2/4 bilat. Brisk capillary refill to all digits.   Neurologic Epicritic sensation grossly intact.  Dermatologic Incisions remain well coapted some oozing from medial incision, no pulsatile bleeding  Orthopedic: Motor function normal    Assessment & Plan:  Patient was evaluated and treated and all questions answered.  POD #1 status post Lapidus bunionectomy, double arthrodesis left foot -Should be stable for discharge once pain controlled and PT needs are met.  She had some bleeding of the bandages morning so did not work with PT and she will work with them again this afternoon now that I have changed the dressing and pain is controlled -Changed pain regimen to what she will be on outpatient as follows: 1000 mg acetaminophen every 6 hours, gabapentin 300 milligrams every 8 hours, oxycodone 5 to 10 mg every 4-6 hours as needed for pain -We will discontinue heparin and start Xarelto at 1800 tonight for DVT PPx we will continue this for discharge -Strictly NWB left lower extremity -Dressing was changed with new sterile dressings and compression bandage.   No signs infection incision remains well coapted, some slow bleeding from medial incision but should resolve with continued ice elevation and compression.  No evidence hematoma -Emphasized importance of elevation and ice behind the knee, ice over the dressing will not do much.  Ice on 20 minutes off 20 minutes until pain is completely controlled -Appreciate care from hospitalist service, please call text or secure chat if questions or issues  Criselda Peaches, DPM  Accessible via secure chat for questions or concerns.

## 2021-06-30 NOTE — Assessment & Plan Note (Addendum)
Physical therapy wanted to work with the patient again tomorrow morning.  They work with the patient twice today.  Continue pain control.  With gabapentin, oxycodone and Tylenol.  DVT prophylaxis with Xarelto.  Medications E prescribed into her pharmacy today.

## 2021-06-30 NOTE — Progress Notes (Signed)
PT Cancellation Note  Patient Details Name: Stacy Decker MRN: [id number] DOB: 1951/01/14   Cancelled Treatment:    Reason Eval/Treat Not Completed: Other (comment) ("I just got settled, I really don't feel up to it.")  Patient educated how she will need PT evaluation in order to return home. Despite encouragement, she refused, stating she just didn't feel up to getting out of bed. Moon Budde PT, DPT 06/30/2021, 3:12 PM

## 2021-06-30 NOTE — Op Note (Addendum)
Patient Name: Stacy Decker DOB: 04/15/1951  MRN: [id number]   Date of Service: 06/29/2021  Surgeon: Dr. Lanae Crumbly, DPM Assistants: Dr Celesta Gentile, DPM Pre-operative Diagnosis:  #1 pes planovalgus #2 calcaneal valgus #3 osteoarthritis 1st tarsometatarsal joint #4 osteoarthritis subtalar and talonavicular joints #5 hallux valgus deformity Post-operative Diagnosis:  #1 pes planovalgus #2 calcaneal valgus #3 osteoarthritis 1st tarsometatarsal joint #4 osteoarthritis subtalar and talonavicular joints #5 hallux valgus deformity  Procedures:             1) arthrodesis subtalar joint left             2) arthrodesis talonavicular joint left             3) correction hallux valgus with Lapidus bunionectomy left              Pathology/Specimens: * No specimens in log * Anesthesia: regional block and IV sedation Hemostasis:  Total Tourniquet Time Documented: Thigh (Left) - 120 minutes Thigh (Left) - 76 minutes Total: Thigh (Left) - 196 minutes  Estimated Blood Loss: 5 mL Materials:  Implant Name Type Inv. Item Serial No. Manufacturer Lot No. LRB No. Used Action  KIT INJECTABLE AUGMENT 1.5 - [id number] Tissue KIT INJECTABLE AUGMENT 1.5  [employer] [id number] Left 1 Implanted  BONE [facility] CHIPS 20CC PCAN1/4 - [id number] Bone Implant BONE [facility] CHIPS 20CC PCAN1/4 [id number] [facility]  Left 1 Implanted  7.0 x 75 screw    [employer]  Left 1 Implanted  7.0 x 70 screw    [employer]  Left 1 Implanted  staple    [employer] [id number] Left 1 Implanted and Explanted  STAPLE COMPR EASYFUSE 20X15 - [id number] Staple STAPLE COMPR EASYFUSE 20X15  [facility] [employer] [id number] Left 1 Implanted  5.0 x 60 screw    [employer]  Left 1 Implanted and Explanted  5.0 x 44 screw    [employer]  Left 1 Implanted  STAPLE COMPR EASYFUSE 20X15 - [id number] Staple STAPLE COMPR EASYFUSE 20X15  [facility] [employer] [id number] Left 1 Implanted  5.0 x 32 Screw    [id number] [employer]  Left 1 Implanted  STAPLE COMPR EASYFUSE 25X20 - [id number] Staple STAPLE COMPR EASYFUSE 25X20  [employer] [id number] Left 1 Implanted   Medications: none Complications: none  Indications for Procedure:  This is a 71 y.o. female with a a severe collapsing pes planovalgus deformity with stage IV posterior tibial tendon dysfunction.  Degenerative changes began to form at the talonavicular and subtalar joints.  After failing multiple nonsurgical treatments including anti-inflammatories bracing and physical therapy she elected for operative intervention.  All risk benefits and potential complications were discussed prior to surgery.  Informed consent was signed and reviewed prior to surgery   Procedure in Detail: Patient was identified in pre-operative holding area. Formal consent was signed and the left lower extremity was marked. Patient was brought back to the operating room. Anesthesia was induced. The extremity was prepped and draped in the usual sterile fashion. Timeout was taken to confirm patient name, laterality, and procedure prior to incision.    Attention was then directed to the foot where a medial incision was made from the navicular tuberosity to the medial malleolus.  This is carried deep into the subcutaneous tissue, cauterizing and ligating vessels as needed, dissection was deepened with exposure of the talonavicular joint.  The medial and dorsal ligaments were released.  Visualization of the plantar portion of the joint showed a insufficient spring ligament.  There is dorsal spurring of the talonavicular joint as well as severe amounts of cartilage thinning on the talar head laterally.  The cartilage and subchondral bone of the medial portions of the head were quite soft due to disuse.  At this point I moved to the lateral side of the foot where a lateral approach to the subtalar joint was created with a linear incision from the distal fibula to the fourth  metatarsal base.  Dissection was carried deep through subcutaneous tissue.  Crossing vessels were tied off and/or ligated as necessary and others were retracted.  Utilizing the plantar lateral border of the extensor digitorum muscle belly and maintaining the deep fascia over the muscle belly I raise the muscle off the lateral superior wall of the calcaneus using an elevator.  The dorsal capsule of the calcaneocuboid joint was released with identification of the joint.  It appeared  healthy and free of significant arthritis. Dissection superiorly continued into the subatalar joint with a significant amount of yellow joint  fluid emanating from this. The bifurcate and surgical ligaments were then released.  This allowed exposure of the posterior and middle facets of the subtalar joint the lateral portion of the talonavicular joint was also visible through a capsulotomy here.  I then began preparation of both the subtalar and talonavicular joints using combination of osteotomes and rongeurs and curettes and exposure with  Hintermann retractor with 0.062 inch Kirschner wires and a lamina spreader.  The cartilage was removed from all joint surfaces with maintaining of the underlying subchondral bone architecture.  Care was taken on the medial portions of the talar head to maintain its bone structure as the bone was quite soft.  Once both the subtalar and talonavicular joint surfaces through both incisions have been completely denuded of all cartilage and thoroughly irrigated of all soft tissue and cartilaginous debris.  Using a 2.5 mm drill I then fenestrated extensively throughout the opposing surfaces of the talonavicular and subtalar joints.  Reamings were left in place. 10 cc of cancellous cadaveric bone chips and [employer] platelet derived growth factor were prepared, this was used as an alternative to autograft, I had concerns of taking a reaming of the tibia considering the age and bone density of  this patient.  This mixture was then carefully interposition into the fusion sites of the subtalar and talonavicular joints.     I then began provisional fixation of the subtalar joint by aligning the heel under fluoroscopic guidance with the long axis of the tibia and slight valgus.  This was fixated temporarily using the guidepins for the 7.0 mm headless screws.  I then turned my attention to the talonavicular joint and positioned it in slight adduction of the forefoot on the hindfoot with good coverage of the talar head both visually and radiographically and slight forefoot valgus.  This was then temporarily fixated. I then drilled the path for the superior subtalar screw and measured appropriately.  Good compression across the fusion site was noted with insertion of the screw position was checked in multiple views and live fluoroscopy to ensure that it does not violate the cortex of the talar neck or the ankle joint.   A staple was placed medially at the talonavicular joint, however the leg length was too long and would violate the [place] joint so was switched for a shorter length staple. The lateral screw was then positioned as well but after insertion there was concern the length and position would also violate  the Logan joint or medial gutter and so was exchanged for a shorter screw and supplemented with a dorsal staple for additional  fixation and compression. The screw position and length was evaluated with both fluoroscopy and was visible along the medial talar body.  We then directed our attention to the distal forefoot where she had a large hallux valgus deformity.  A dorsal incision was made over the first tarsometatarsal joint just medial to the EHL tendon.  Dissection was carried through subcutaneous tissue and the deep fascia.  The first tarsometatarsal joint was exposed with a capsulotomy.  It had severe arthritic changes. I then directed my attention distal into the first interspace where an incision  was made and a lateral release of the lateral capsule and sesamoidal suspensory ligament was performed using a #15 blade with a varus maneuver of the toe with good release of the contracture.  Returning to the first tarsometatarsal joint an osteotome was used to break up the plantar capsular attachments of the joint.  A sagittal saw was then used to resect the articular cartilage and small amount of the subchondral bone of the metatarsal base as well as the medial cuneiform with a slight wedge resection taken from the medial cuneiform with more bone removed laterally.  Both joint surfaces were then fenestrated thoroughly with a 2.5 mm drill and a small amount of cancellous bone chips were placed into the fusion site for bone graft.  The first metatarsal was rotated into a varus position manually and the intermetatarsal angle was reduced. It was plantarflexed as well to stabilize the medial column. This was then temporarily fixated.  A guidewire for the 5.0 mm headless screw was then placed from the dorsal lateral ridge into the plantar medial cuneiform.  The screw was inserted and good compression across the osteotomy site was noted.  Additional fixation was then achieved with a medial compression staple placed across the joint.  Unfortunately due to orientation it was not possible to re-use the screw or staple  previously used in the TN joint and so new hardware  was selected after measurement. Good position and stability of the fusion site was noted with correction of the deformity.  Final films were then taken. A tendo-Achilles lengthening was considered pre operatively but  was not necessary to achieve her correction.   Incisions were all then thoroughly irrigated with normal sterile saline.  Layered closure for each incision was then undertaken utilizing a combination of 3-0 Vicryl for deep fascia and capsular structures, 3-0 Monocryl for subcutaneous tissue, 3-0 and 4-0 nylon and skin staples for the skin  layer.  It was dressed with Xeroform, sterile gauze Kerlix ABD pad and an Ace wrap.  A well-padded below the knee posterior fiberglass splint was then applied.   Disposition: Following a period of post-operative monitoring, patient will be transferred to the floor for  pain control, physical therapy, and then to home.

## 2021-06-30 NOTE — Progress Notes (Signed)
PT Cancellation Note  Patient Details Name: Selyna Klahn MRN: [id number] DOB: 1951-03-09   Cancelled Treatment:    Reason Eval/Treat Not Completed: Other (comment)  PT attempted evaluation at 11:38 AM, however patient states, "I am bleeding and need to get my bandages changed. Can you come back later?"   PT did obtain history and PLOF information. Will re-attempted evaluation later today as patient agreeable and available.  Lyn Joens PT, DPT 06/30/2021, 12:46 PM

## 2021-06-30 NOTE — Progress Notes (Signed)
°  Progress Note   Patient: Stacy Decker SFK:[id number] DOB: 19-Oct-1950 DOA: 06/29/2021     0 DOS: the patient was seen and examined on 06/30/2021    Assessment and Plan: * Anxiety and depression- (present on admission) On Celexa and BuSpar.   Status post left foot surgery Physical therapy nonweightbearing left foot.  Pain control.  Disposition as per podiatry.        Subjective: Patient in quite a bit of pain in her ankle.  As per family it was an extensive surgery yesterday.  Offers no other complaints.  Physical Exam: Vitals:   06/29/21 2009 06/30/21 0344 06/30/21 0804 06/30/21 1131  BP: (!) 146/52 135/60 130/77 128/74  Pulse: 65 100 98 87  Resp: 16 16 18 16   Temp: (!) 97.5 F (36.4 C) 100 F (37.8 C) 99.1 F (37.3 C) 98.7 F (37.1 C)  TempSrc:      SpO2: 97% 94% 97% 93%  Weight:      Height:       Physical Exam HENT:     Head: Normocephalic.     Mouth/Throat:     Pharynx: No oropharyngeal exudate.  Eyes:     General: Lids are normal.     Conjunctiva/sclera: Conjunctivae normal.  Cardiovascular:     Rate and Rhythm: Normal rate and regular rhythm.     Heart sounds: Normal heart sounds, S1 normal and S2 normal.  Pulmonary:     Breath sounds: Normal breath sounds. No decreased breath sounds, wheezing, rhonchi or rales.  Abdominal:     Palpations: Abdomen is soft.     Tenderness: There is no abdominal tenderness.  Musculoskeletal:     Right lower leg: No swelling.     Comments: Left ankle and foot wrapped.  Skin:    General: Skin is warm.     Findings: No rash.  Neurological:     Mental Status: She is alert and oriented to person, place, and time.     Comments: Able to wiggle toes and had sensation in her toes on her left foot     Data Reviewed: Laboratory and radiological data reviewed  Family Communication: Spoke with family at bedside  Disposition: Status is: Observation The patient remains OBS appropriate and will d/c before 2  midnights.  Planned Discharge Destination: Home with Home Health Disposition as per podiatry  Author: Loletha Grayer, MD 06/30/2021 12:22 PM  For on call review [email or web address].

## 2021-07-01 DIAGNOSIS — M19072 Primary osteoarthritis, left ankle and foot: Secondary | ICD-10-CM | POA: Diagnosis not present

## 2021-07-01 DIAGNOSIS — F32A Depression, unspecified: Secondary | ICD-10-CM | POA: Diagnosis not present

## 2021-07-01 DIAGNOSIS — F419 Anxiety disorder, unspecified: Secondary | ICD-10-CM | POA: Diagnosis not present

## 2021-07-01 DIAGNOSIS — Z9889 Other specified postprocedural states: Secondary | ICD-10-CM | POA: Diagnosis not present

## 2021-07-01 MED ORDER — GABAPENTIN 300 MG PO CAPS: 300.0000 mg | ORAL_CAPSULE | Freq: Three times a day (TID) | ORAL | 0 refills | Status: DC

## 2021-07-01 MED ORDER — OXYCODONE HCL 5 MG PO TABS: 5.0000 mg | ORAL_TABLET | Freq: Four times a day (QID) | ORAL | 0 refills | Status: AC | PRN

## 2021-07-01 MED ORDER — RIVAROXABAN 10 MG PO TABS: 10.0000 mg | ORAL_TABLET | Freq: Every day | ORAL | 0 refills | Status: DC

## 2021-07-01 MED ORDER — ACETAMINOPHEN 500 MG PO TABS: 1000.0000 mg | ORAL_TABLET | Freq: Four times a day (QID) | ORAL | 0 refills | Status: AC | PRN

## 2021-07-01 NOTE — Evaluation (Signed)
Physical Therapy Evaluation Patient Details Name: Stacy Decker MRN: [id number] DOB: 1950/07/10 Today's Date: 07/01/2021  History of Present Illness  Pt is a 71 y/o F admitted on 06/29/21 for scheduled surgery. Pt underwent status post Lapidus bunionectomy, double arthrodesis left foot. Pt is NWB LLE. PMH: anxiety, depression, osteoporosis, recovering alcoholic  Clinical Impression  Pt seen for PT evaluation with pt reporting she was independent & working part time prior to scheduled surgery. PT reviews NWB restrictions with pt & educates her on sit>stand & gait techniques to maintain this. Pt requires supervision for supine>sit but min assist sit>stand & up to mod/max for stand pivot to recliner 2/2 significant LOB near end of transfer. Pt is able to ambulate short distance in RW but with decreased balance & speed. Provided pt with LE seated HEP & pt performs exercises with instructional cuing for technique. Due to pt's current impaired balance with RW, it is unsafe to attempt knee scooter at this time & pt agreeable & willing to attempt to use that AD later with HHPT services. Will attempt to see pt again today to see if pt is safe to d/c home but at this time will require additional therapy to decrease fall risk.        Recommendations for follow up therapy are one component of a multi-disciplinary discharge planning process, led by the attending physician.  Recommendations may be updated based on patient status, additional functional criteria and insurance authorization.  Follow Up Recommendations Home health PT    Assistance Recommended at Discharge Frequent or constant Supervision/Assistance  Patient can return home with the following  A little help with walking and/or transfers;A little help with bathing/dressing/bathroom;Assistance with cooking/housework;Direct supervision/assist for financial management;Assist for transportation;Help with stairs or ramp for entrance;Direct  supervision/assist for medications management    Equipment Recommendations BSC/3in1 (pt already has RW, transport w/c, and knee scooter)  Recommendations for Other Services       Functional Status Assessment Patient has had a recent decline in their functional status and demonstrates the ability to make significant improvements in function in a reasonable and predictable amount of time.     Precautions / Restrictions Precautions Precautions: Fall Restrictions Weight Bearing Restrictions: Yes LLE Weight Bearing: Non weight bearing      Mobility  Bed Mobility Overal bed mobility: Needs Assistance Bed Mobility: Supine to Sit     Supine to sit: Supervision, HOB elevated (HOB slightly elevated)     General bed mobility comments: use of bed rails    Transfers Overall transfer level: Needs assistance Equipment used: Rolling walker (2 wheels) Transfers: Sit to/from Stand, Bed to chair/wheelchair/BSC Sit to Stand: Min assist (max cuing for proper, safe hand placement during sit<>stand)   Step pivot transfers: Min assist, Mod assist, Max assist (Pt initially requires min assist then has LOB near end of pivot portion & requires mod/max assist to correct & prevent significant LOB.)            Ambulation/Gait Ambulation/Gait assistance: Min guard Gait Distance (Feet): 10 Feet Assistive device: Rolling walker (2 wheels) Gait Pattern/deviations: Step-to pattern, Decreased step length - right, Decreased stride length Gait velocity: decreased     General Gait Details: Pt provides demo/education/cuing to use BUE to decrease weight bearing RLE to prevent solely hoppinng on RLE but pt would benefit from ongoing cuing.  Stairs            Wheelchair Mobility    Modified Rankin (Stroke Patients Only)  Balance Overall balance assessment: Needs assistance Sitting-balance support: Feet supported Sitting balance-Leahy Scale: Good     Standing balance support:  Reliant on assistive device for balance, During functional activity Standing balance-Leahy Scale: Poor                               Pertinent Vitals/Pain Pain Assessment Pain Assessment: No/denies pain    Home Living Family/patient expects to be discharged to:: Private residence Living Arrangements: Alone Available Help at Discharge: Available 24 hours/day;Family;Other (Comment) (boyfriend and/or sister) Type of Home: House Home Access: Ramped entrance       Home Layout: One level Home Equipment: [place] (2 wheels) (knee scooter, transport w/c, raised toilet seat with handles)      Prior Function Prior Level of Function : Independent/Modified Independent;Driving;Working/employed             Mobility Comments: Pt is independent without AD at baseline, employed part time as a Research scientist (physical sciences), driving.       Hand Dominance        Extremity/Trunk Assessment   Upper Extremity Assessment Upper Extremity Assessment: Overall WFL for tasks assessed    Lower Extremity Assessment Lower Extremity Assessment: Overall WFL for tasks assessed (LLE not formally assessed but 3/5 knee extension in sitting)    Cervical / Trunk Assessment Cervical / Trunk Assessment: Normal  Communication   Communication: No difficulties  Cognition Arousal/Alertness: Awake/alert Behavior During Therapy: WFL for tasks assessed/performed Overall Cognitive Status: Within Functional Limits for tasks assessed                                          General Comments      Exercises General Exercises - Lower Extremity Long Arc Quad: AROM, Strengthening, Left, 10 reps, Seated Hip ABduction/ADduction: AROM, Strengthening, 10 reps, Seated (hip adduction pillow squeezes) Toe Raises: AROM, Strengthening, Left, 10 reps, Seated   Assessment/Plan    PT Assessment Patient needs continued PT services  PT Problem List Decreased strength;Decreased mobility;Decreased  safety awareness;Decreased knowledge of precautions;Decreased activity tolerance;Cardiopulmonary status limiting activity;Decreased balance;Decreased knowledge of use of DME       PT Treatment Interventions DME instruction;Therapeutic exercise;Gait training;Balance training;Cognitive remediation;Functional mobility training;Therapeutic activities;Patient/family education;Neuromuscular re-education;Modalities;Manual techniques    PT Goals (Current goals can be found in the Care Plan section)  Acute Rehab PT Goals Patient Stated Goal: go home PT Goal Formulation: With patient Time For Goal Achievement: 07/15/21 Potential to Achieve Goals: Good    Frequency 7X/week     Co-evaluation               AM-PAC PT "6 Clicks" Mobility  Outcome Measure Help needed turning from your back to your side while in a flat bed without using bedrails?: None Help needed moving from lying on your back to sitting on the side of a flat bed without using bedrails?: None Help needed moving to and from a bed to a chair (including a wheelchair)?: A Little Help needed standing up from a chair using your arms (e.g., wheelchair or bedside chair)?: A Little Help needed to walk in hospital room?: A Little Help needed climbing 3-5 steps with a railing? : Total 6 Click Score: 18    End of Session Equipment Utilized During Treatment: Gait belt Activity Tolerance: Patient tolerated treatment well Patient left: in chair;with chair alarm set;with  call bell/phone within reach Nurse Communication: Mobility status;Weight bearing status PT Visit Diagnosis: Unsteadiness on feet (R26.81);Difficulty in walking, not elsewhere classified (R26.2);Muscle weakness (generalized) (M62.81)    Time: 5615-3794 PT Time Calculation (min) (ACUTE ONLY): 22 min   Charges:   PT Evaluation $PT Eval Low Complexity: 1 Low PT Treatments $Therapeutic Activity: 8-22 mins        Lavone Nian, PT, DPT 07/01/21, 2:03  PM   Waunita Schooner 07/01/2021, 2:01 PM

## 2021-07-01 NOTE — Progress Notes (Signed)
Physical Therapy Treatment Patient Details Name: Stacy Decker MRN: [id number] DOB: 1951/03/19 Today's Date: 07/01/2021   History of Present Illness Pt is a 71 y/o F admitted on 06/29/21 for scheduled surgery. Pt underwent status post Lapidus bunionectomy, double arthrodesis left foot. Pt is NWB LLE. PMH: anxiety, depression, osteoporosis, recovering alcoholic    PT Comments    Pt seen for PT tx with pt received in bed & agreeable. Session focused on blocked practice of sit<>stand & stand pivot transfer with RW with pt performing at min assist level except with 1 LOB requiring mod assist to correct & ongoing cuing for safe hand placement throughout. Pt is able to ambulate recliner>bathroom with RW & min assist but 1 episode of R knee buckling & mod assist to correct. Pt is doing a better job of ambulating within base of AD though. After toilet, pt is able to perform hand hygiene while standing with CGA<>min assist. Due to pt experiencing ongoing LOB during mobility she would benefit from further PT intervention prior to d/c home with assistance - team notified.     Recommendations for follow up therapy are one component of a multi-disciplinary discharge planning process, led by the attending physician.  Recommendations may be updated based on patient status, additional functional criteria and insurance authorization.  Follow Up Recommendations  Home health PT     Assistance Recommended at Discharge Frequent or constant Supervision/Assistance  Patient can return home with the following A little help with walking and/or transfers;A little help with bathing/dressing/bathroom;Assistance with cooking/housework;Direct supervision/assist for financial management;Assist for transportation;Help with stairs or ramp for entrance;Direct supervision/assist for medications management   Equipment Recommendations  BSC/3in1    Recommendations for Other Services       Precautions / Restrictions  Precautions Precautions: Fall Restrictions Weight Bearing Restrictions: Yes LLE Weight Bearing: Non weight bearing     Mobility  Bed Mobility Overal bed mobility: Needs Assistance Bed Mobility: Supine to Sit     Supine to sit: Supervision, HOB elevated (HOB slightly elevated)     General bed mobility comments: Pt received & left sitting in recliner    Transfers Overall transfer level: Needs assistance Equipment used: Rolling walker (2 wheels) Transfers: Bed to chair/wheelchair/BSC, Sit to/from Stand Sit to Stand: Min assist (ongoing cuing for safe hand placement on stable surface during sit<>Stand)   Step pivot transfers: Min guard, Min assist (pt completes chair<>bed x 4 times for blocked practice of hand placement & sequencing but pt continues to demonstrate impaired balance, with 1 LOB & mod assist to correct)            Ambulation/Gait Ambulation/Gait assistance: Min assist, Mod assist Gait Distance (Feet): 22 Feet Assistive device: Rolling walker (2 wheels) Gait Pattern/deviations: Step-to pattern, Decreased step length - right, Decreased stride length Gait velocity: decreased     General Gait Details: Pt ambulates recliner>bathroom with RW & min assist with good ability to negotiate bathroom threshold but 1 instance of R knee buckling & heavy MOD assist to prevent LOB. Pt begins to demonstrate a better job of ambulating within base of AD with ongoing cuing from PT.   Stairs             Wheelchair Mobility    Modified Rankin (Stroke Patients Only)       Balance Overall balance assessment: Needs assistance Sitting-balance support: Feet supported Sitting balance-Leahy Scale: Good Sitting balance - Comments: Pt able to perform peri hygiene sitting on toilet without LOB   Standing  balance support: During functional activity, Bilateral upper extremity supported Standing balance-Leahy Scale: Fair Standing balance comment: CGA<>min assist for standing  to perform hand hygiene but pt leaning on sink with BUE.                            Cognition Arousal/Alertness: Awake/alert Behavior During Therapy: WFL for tasks assessed/performed Overall Cognitive Status: Within Functional Limits for tasks assessed                                 General Comments: During session during education re: safety with functional mobility pt appears easily distracted & has somewhat decreased safety awareness overall.        Exercises General Exercises - Lower Extremity Long Arc Quad: AROM, Strengthening, Left, 10 reps, Seated Hip ABduction/ADduction: AROM, Strengthening, 10 reps, Seated (hip adduction pillow squeezes) Toe Raises: AROM, Strengthening, Left, 10 reps, Seated    General Comments General comments (skin integrity, edema, etc.): Pt with continent BM & void on toilet.      Pertinent Vitals/Pain Pain Assessment Pain Assessment: No/denies pain    Home Living Family/patient expects to be discharged to:: Private residence Living Arrangements: Alone Available Help at Discharge: Available 24 hours/day;Family;Other (Comment) (boyfriend and/or sister) Type of Home: House Home Access: Ramped entrance       Home Layout: One level Home Equipment: Conservation officer, nature (2 wheels) (knee scooter, transport w/c, raised toilet seat with handles)      Prior Function            PT Goals (current goals can now be found in the care plan section) Acute Rehab PT Goals Patient Stated Goal: go home PT Goal Formulation: With patient Time For Goal Achievement: 07/15/21 Potential to Achieve Goals: Good Progress towards PT goals: Progressing toward goals    Frequency    7X/week      PT Plan Current plan remains appropriate    Co-evaluation              AM-PAC PT "6 Clicks" Mobility   Outcome Measure  Help needed turning from your back to your side while in a flat bed without using bedrails?: None Help needed moving  from lying on your back to sitting on the side of a flat bed without using bedrails?: None Help needed moving to and from a bed to a chair (including a wheelchair)?: A Little Help needed standing up from a chair using your arms (e.g., wheelchair or bedside chair)?: A Little Help needed to walk in hospital room?: A Little Help needed climbing 3-5 steps with a railing? : Total 6 Click Score: 18    End of Session Equipment Utilized During Treatment: Gait belt Activity Tolerance: Patient tolerated treatment well Patient left: in chair;with chair alarm set;with call bell/phone within reach Nurse Communication: Mobility status (pt with BM during session) PT Visit Diagnosis: Unsteadiness on feet (R26.81);Difficulty in walking, not elsewhere classified (R26.2);Muscle weakness (generalized) (M62.81)     Time: 1212-1228 PT Time Calculation (min) (ACUTE ONLY): 16 min  Charges:  $Therapeutic Activity: 8-22 mins                     Lavone Nian, PT, DPT 07/01/21, 2:09 PM    Waunita Schooner 07/01/2021, 2:07 PM

## 2021-07-01 NOTE — Progress Notes (Addendum)
°  Progress Note   Patient: Stacy Decker MPN:[id number] DOB: 07/22/1950 DOA: 06/29/2021     0 DOS: the patient was seen and examined on 07/01/2021   Assessment and Plan: * Status post left foot surgery Physical therapy wanted to work with the patient again tomorrow morning.  They work with the patient twice today.  Continue pain control.  With gabapentin, oxycodone and Tylenol.  DVT prophylaxis with Xarelto.  Medications E prescribed into her pharmacy today.  Anxiety and depression- (present on admission) On Celexa and BuSpar.      Subjective: Patient feeling well.  Does not want a lot of pain medications.  Was admitted for elective foot surgery.  Physical Exam: Vitals:   06/30/21 2100 07/01/21 0359 07/01/21 0757 07/01/21 1147  BP: 108/62 126/63 120/60 128/69  Pulse: 86 83 86 (!) 103  Resp: 18 16 16 18   Temp: 98.7 F (37.1 C) 98.4 F (36.9 C) 98.5 F (36.9 C) 98.5 F (36.9 C)  TempSrc: Oral     SpO2: 98% 91% 96% 96%  Weight:      Height:       Physical Exam HENT:     Head: Normocephalic.     Mouth/Throat:     Pharynx: No oropharyngeal exudate.  Eyes:     General: Lids are normal.     Conjunctiva/sclera: Conjunctivae normal.  Cardiovascular:     Rate and Rhythm: Normal rate and regular rhythm.     Heart sounds: Normal heart sounds, S1 normal and S2 normal.  Pulmonary:     Breath sounds: Normal breath sounds. No decreased breath sounds, wheezing, rhonchi or rales.  Abdominal:     Palpations: Abdomen is soft.     Tenderness: There is no abdominal tenderness.  Musculoskeletal:     Right lower leg: No swelling.     Comments: Left ankle and foot wrapped.  Skin:    General: Skin is warm.     Findings: No rash.  Neurological:     Mental Status: She is alert and oriented to person, place, and time.     Comments: Able to wiggle toes and had sensation in her toes on her left foot     Data Reviewed:  There are no new results to review at this  time.  Disposition: Status is: Observation The patient will require care spanning > 2 midnights and should be kept observation at this time.  Hopefully will be able to discharge tomorrow morning.  Planned Discharge Destination: Home with Home Health  Case discussed with physical therapist and nursing staff  Author: Loletha Grayer, MD 07/01/2021 2:15 PM  For on call review [email or web address].

## 2021-07-01 NOTE — Progress Notes (Signed)
Subjective:  Patient ID: Stacy Decker, female    DOB: 1950-11-17,  MRN: [id number]  POD #2  status post Lapidus bunionectomy, double arthrodesis left foot  Patient relates pain continues to be well controlled about a 2/10. No issues with the dressing this morning and relates she is ready to work with PT.   Negative for chest pain and shortness of breath Fever: no Night sweats: no Constitutional signs: no Review of all other systems is negative Objective:   Vitals:   07/01/21 0359 07/01/21 0757  BP: 126/63 120/60  Pulse: 83 86  Resp: 16 16  Temp: 98.4 F (36.9 C) 98.5 F (36.9 C)  SpO2: 91% 96%   General AA&O x3. Normal mood and affect.  Vascular Dorsalis pedis and posterior tibial pulses 2/4 bilat. Brisk capillary refill to all digits.   Neurologic Epicritic sensation grossly intact.  Dermatologic Dressings clean dry and intact.   Orthopedic: Motor function normal    Assessment & Plan:  Patient was evaluated and treated and all questions answered.  POD #1 status post Lapidus bunionectomy, double arthrodesis left foot -Should be stable for discharge once  PT needs are met.   -Dressing clean dry and intact.  -Continue with pain regimen to what she will be on outpatient as follows: 1000 mg acetaminophen every 6 hours, gabapentin 300 milligrams every 8 hours, oxycodone 5 to 10 mg every 4-6 hours as needed for pain -Continue Xarelto -Strictly NWB left lower extremity -Emphasized importance of elevation and ice behind the knee, ice over the dressing will not do much.  Ice on 20 minutes off 20 minutes until pain is completely controlled -Appreciate care from hospitalist service, please call text or secure chat if questions or issues  Lorenda Peck, MD  Accessible via secure chat for questions or concerns.

## 2021-07-01 NOTE — TOC Initial Note (Signed)
Transition of Care [facility]) - Initial/Assessment Note    Patient Details  Name: Stacy Decker MRN: [id number] Date of Birth: 1950/11/02  [facility] CM/SW Contact:    Elliot Gurney [place], [place] Phone Number: 07/01/2021, 12:05 PM  Clinical Narrative:                 This Education officer, museum met with patient at bedside. 30 day coupon for Xarelto completed. Patient anticipating discharge today, following PT re-evaluation. Patient's sister,will transport patient home and will be available to assist with her care needs once home. Patient primary care provider is through [facility]. [facility] needs. Patient verbalized having no additional TOC needs at this time.   [place], [place] Transition of Care [phone]   Expected Discharge Plan: Home/Self Care Barriers to Discharge: Continued Medical Work up   Patient Goals and CMS Choice Patient states their goals for this hospitalization and ongoing recovery are:: "I wan to get my foot better"      Expected Discharge Plan and Services Expected Discharge Plan: Home/Self Care In-house Referral: Clinical Social Work   Post Acute Care Choice: Durable Medical Equipment (Knee scooter, walker, raised toilet seat with handles, bath chair) Living arrangements for the past 2 months: [place]                                      Prior Living Arrangements/Services Living arrangements for the past 2 months: Single Family Home Lives with:: Self   Do you feel safe going back to the place where you live?: Yes      Need for Family Participation in Patient Care: No (Comment) Care giver support system in place?: Yes (comment)   Criminal Activity/Legal Involvement Pertinent to Current Situation/Hospitalization: No - Comment as needed  Activities of Daily Living Home Assistive Devices/Equipment: Eyeglasses ADL Screening (condition at time of admission) Patient's cognitive  ability adequate to safely complete daily activities?: Yes Is the patient deaf or have difficulty hearing?: Yes Does the patient have difficulty seeing, even when wearing glasses/contacts?: Yes Does the patient have difficulty concentrating, remembering, or making decisions?: Yes Patient able to express need for assistance with ADLs?: Yes Does the patient have difficulty dressing or bathing?: No Independently performs ADLs?: Yes (appropriate for developmental age) Does the patient have difficulty walking or climbing stairs?: Yes Weakness of Legs: Left Weakness of Arms/Hands: None  Permission Sought/Granted                  Emotional Assessment Appearance:: Appears older than stated age Attitude/Demeanor/Rapport: Engaged, Ambitious   Orientation: : Oriented to Self, Oriented to Place, Oriented to  Time, Oriented to Situation Alcohol / Substance Use: Not Applicable Psych Involvement: No (comment)  Admission diagnosis:  Status post left foot surgery [Z98.890] Patient Active Problem List   Diagnosis Date Noted   Pes planus    Arthritis of left subtalar joint    Acquired hallux valgus of left foot    Status post left foot surgery 06/29/2021   History of tobacco abuse 06/29/2021   Recovering alcoholic ([place]) 30/16/0109   Spider veins 02/09/2020   Obesity (BMI 30-39.9) 07/31/2018   Estrogen deficiency 07/31/2018   Prediabetes 05/29/2017   Anxiety and depression 05/29/2017   Screening for cervical cancer 05/29/2017   DVT prophylaxis 05/29/2017   Osteoporosis without current pathological fracture 03/12/2016   PCP:  Raenette Rover,  Laurian Brim, PA-C Pharmacy:   [id number] [place], [place] [id number]: [phone]: [phone]    Social Determinants of Health (SDOH) Interventions    Readmission Risk Interventions No flowsheet data found.

## 2021-07-01 NOTE — Discharge Instructions (Signed)
Non weight bearing left leg

## 2021-07-02 ENCOUNTER — Encounter: Payer: Self-pay | Admitting: Podiatry

## 2021-07-02 DIAGNOSIS — M19072 Primary osteoarthritis, left ankle and foot: Secondary | ICD-10-CM | POA: Diagnosis not present

## 2021-07-02 DIAGNOSIS — F419 Anxiety disorder, unspecified: Secondary | ICD-10-CM | POA: Diagnosis not present

## 2021-07-02 DIAGNOSIS — F32A Depression, unspecified: Secondary | ICD-10-CM | POA: Diagnosis not present

## 2021-07-02 DIAGNOSIS — Z9889 Other specified postprocedural states: Secondary | ICD-10-CM | POA: Diagnosis not present

## 2021-07-02 LAB — BASIC METABOLIC PANEL
Anion gap: 4 — ABNORMAL LOW (ref 5–15)
BUN: 11 mg/dL (ref 8–23)
CO2: 27 mmol/L (ref 22–32)
Calcium: 8.3 mg/dL — ABNORMAL LOW (ref 8.9–10.3)
Chloride: 107 mmol/L (ref 98–111)
Creatinine, Ser: 0.55 mg/dL (ref 0.44–1.00)
GFR, Estimated: >60 mL/min (ref >60)
Glucose, Bld: 133 mg/dL — ABNORMAL HIGH (ref 70–99)
Potassium: 3.9 mmol/L (ref 3.5–5.1)
Sodium: 138 mmol/L (ref 135–145)

## 2021-07-02 LAB — HEMOGLOBIN: Hemoglobin: 11.4 g/dL — ABNORMAL LOW (ref 12.0–15.0)

## 2021-07-02 NOTE — Discharge Summary (Signed)
Physician Discharge Summary   Patient: Stacy Decker MRN: [id number] DOB: May 25, 1951  Admit date:     06/29/2021  Discharge date: 07/02/21  Discharge Physician: Loletha Grayer   PCP: Girtha Rm, PA-C   Recommendations at discharge:   Follow-up with podiatry.  Follow-up PMD in 5 days  Discharge Diagnoses: Principal Problem:   Status post left foot surgery Active Problems:   Anxiety and depression   Pes planus   Arthritis of left subtalar joint   Acquired hallux valgus of left foot   Hospital Course: The patient was admitted for an elective surgery of the left ankle.  Please see the operative report by Dr. Lanae Crumbly on 06/29/2020.  The patient was initially on IV and oral pain medications.  The patient will follow up podiatry as outpatient and be nonweightbearing on the left foot.  Patient was started on low-dose Xarelto for anticoagulation.  The patient worked a couple days with physical therapy and was stable for disposition home.  Transitional care team stated that her insurance will not cover home health.  Outpatient PT and OT ordered.  Patient prescribed oxycodone (12 pills), gabapentin and low-dose Xarelto  Assessment and Plan: * Status post left foot surgery Physical therapy wanted to work with the patient again tomorrow morning.  They work with the patient twice today.  Continue pain control.  With gabapentin, oxycodone and Tylenol.  DVT prophylaxis with Xarelto.  Medications E prescribed into her pharmacy today.  Anxiety and depression- (present on admission) On Celexa and BuSpar.            Consultants: Podiatry Procedures performed: Left ankle surgery Disposition: Home Diet recommendation:  Regular diet  DISCHARGE MEDICATION: Allergies as of 07/02/2021   No Known Allergies      Medication List     STOP taking these medications    acetaminophen 650 MG CR tablet Commonly known as: TYLENOL Replaced by: acetaminophen 500 MG tablet    aspirin EC 81 MG tablet       TAKE these medications    acetaminophen 500 MG tablet Commonly known as: TYLENOL Take 2 tablets (1,000 mg total) by mouth every 6 (six) hours as needed for up to 7 days for mild pain. Replaces: acetaminophen 650 MG CR tablet   buPROPion 150 MG 24 hr tablet Commonly known as: WELLBUTRIN XL Take 150 mg by mouth every morning.   busPIRone 7.5 MG tablet Commonly known as: BUSPAR Take 7.5 mg by mouth 2 (two) times daily as needed (anxiety).   Calcium 1000 + D 1000-20 MG-MCG Tabs Generic drug: Calcium Carb-Cholecalciferol   cholecalciferol 1000 units tablet Commonly known as: VITAMIN D Take 1,000 Units by mouth daily. Notes to patient: Not given in hospital   citalopram 40 MG tablet Commonly known as: CELEXA Take 40 mg by mouth every evening.   gabapentin 300 MG capsule Commonly known as: NEURONTIN Take 1 capsule (300 mg total) by mouth 3 (three) times daily.   glucosamine-chondroitin 500-400 MG tablet Take 1 tablet by mouth daily. Notes to patient: Not given in hospital   multivitamin tablet Take 1 tablet by mouth daily.   oxyCODONE 5 MG immediate release tablet Commonly known as: Oxy IR/ROXICODONE Take 1 tablet (5 mg total) by mouth every 6 (six) hours as needed for up to 4 days for severe pain.   rivaroxaban 10 MG Tabs tablet Commonly known as: XARELTO Take 1 tablet (10 mg total) by mouth daily with supper.  Follow-up Information     Henson, Vickie L, PA-C Follow up in 5 day(s).   Specialty: Family Medicine Contact information: [place] [address] [phone]         Criselda Peaches, DPM Follow up in 1 week(s).   Specialty: Podiatry Contact information: [address] [place] [address] [phone]                 Discharge Exam: Danley Danker Weights   06/29/21 1126  Weight: 67.1 kg   Physical Exam HENT:     Head: Normocephalic.     Mouth/Throat:     Pharynx: No  oropharyngeal exudate.  Eyes:     General: Lids are normal.     Conjunctiva/sclera: Conjunctivae normal.  Cardiovascular:     Rate and Rhythm: Normal rate and regular rhythm.     Heart sounds: Normal heart sounds, S1 normal and S2 normal.  Pulmonary:     Breath sounds: No decreased breath sounds, wheezing, rhonchi or rales.  Abdominal:     Palpations: Abdomen is soft.     Tenderness: There is no abdominal tenderness.  Musculoskeletal:     Right lower leg: No swelling.     Comments: Left leg wrapped  Skin:    General: Skin is warm.     Findings: No rash.     Comments: Left leg wrapped  Neurological:     Mental Status: She is alert and oriented to person, place, and time.     Condition at discharge: stable  The results of significant diagnostics from this hospitalization (including imaging, microbiology, ancillary and laboratory) are listed below for reference.   Imaging Studies: DG Foot 2 Views Left  Result Date: 06/29/2021 CLINICAL DATA:  Elective surgery. EXAM: LEFT FOOT - 2 VIEW COMPARISON:  Preoperative radiograph 12/01/2020 FINDINGS: Seven fluoroscopic spot views of the left foot obtained in the operating room. 2 screws traverse the talus and calcaneus in the posterior subtalar joint. Fusion of the first metatarsal tarsal articulation. There is also a medial tarsal fusion. Fluoroscopy time 3 minutes 17 seconds. Fluoroscopy dose not provided. IMPRESSION: Intraoperative fluoroscopy during left foot surgery. Electronically Signed   By: Keith Rake M.D.   On: 06/29/2021 19:29   DG C-Arm 1-60 Min-No Report  Result Date: 06/29/2021 Fluoroscopy was utilized by the requesting physician.  No radiographic interpretation.   DG C-Arm 1-60 Min-No Report  Result Date: 06/29/2021 Fluoroscopy was utilized by the requesting physician.  No radiographic interpretation.   DG C-Arm 1-60 Min-No Report  Result Date: 06/29/2021 Fluoroscopy was utilized by the requesting physician.  No  radiographic interpretation.   [place] OR NERVE BLOCK-IMAGE ONLY [facility])  Result Date: 06/29/2021 There is no interpretation for this exam.  This order is for images obtained during a surgical procedure.  Please See "Surgeries" Tab for more information regarding the procedure.   DG MINI C-ARM IMAGE ONLY  Result Date: 06/29/2021 There is no interpretation for this exam.  This order is for images obtained during a surgical procedure.  Please See "Surgeries" Tab for more information regarding the procedure.    Microbiology: Results for orders placed or performed during the hospital encounter of 06/27/21  SARS CORONAVIRUS 2 (TAT 6-24 HRS) Nasopharyngeal Nasopharyngeal Swab     Status: None   Collection Time: 06/27/21  9:59 AM   Specimen: Nasopharyngeal Swab  Result Value Ref Range Status   SARS Coronavirus 2 NEGATIVE NEGATIVE Final    Comment: (NOTE) SARS-CoV-2 target nucleic acids are  NOT DETECTED.  The SARS-CoV-2 RNA is generally detectable in upper and lower respiratory specimens during the acute phase of infection. Negative results do not preclude SARS-CoV-2 infection, do not rule out co-infections with other pathogens, and should not be used as the sole basis for treatment or other patient management decisions. Negative results must be combined with clinical observations, patient history, and epidemiological information. The expected result is Negative.  Fact Sheet for Patients: SugarRoll.be  Fact Sheet for Healthcare Providers: https://www.woods-mathews.com/  This test is not yet approved or cleared by the [place] FDA and  has been authorized for detection and/or diagnosis of SARS-CoV-2 by FDA under an Emergency Use Authorization (EUA). This EUA will remain  in effect (meaning this test can be used) for the duration of the COVID-19 declaration under Se ction 564(b)(1) of the Act, 21 U.S.C. section 360bbb-3(b)(1), unless the authorization  is terminated or revoked sooner.  Performed at [facility], [place] [address], [place], [place] [address]     Labs: CBC: Recent Labs  Lab 07/02/21 0359  HGB 60.4*   Basic Metabolic Panel: Recent Labs  Lab 07/02/21 0359  NA 138  K 3.9  CL 107  CO2 27  GLUCOSE 133*  BUN 11  CREATININE 0.55  CALCIUM 8.3*     Discharge time spent: greater than 30 minutes.  Case discussed with transitional care team, physical therapist and nursing staff   Signed: Loletha Grayer, MD [facility] 07/02/2021

## 2021-07-02 NOTE — Progress Notes (Signed)
Physical Therapy Treatment Patient Details Name: Stacy Decker MRN: [id number] DOB: 25-Jul-1950 Today's Date: 07/02/2021   History of Present Illness Pt is a 71 y/o F admitted on 06/29/21 for scheduled surgery. Pt underwent status post Lapidus bunionectomy, double arthrodesis left foot. Pt is NWB LLE. PMH: anxiety, depression, osteoporosis, recovering alcoholic    PT Comments    Pt seen for PT tx with pt agreeable. Session focused on repetitive practice of sit<>stand, step pivot transfers with RW & CGA with improving carryover & balance. Pt ambulates increased distances but requires multiple standing rest breaks 2/2 RLE & overall fatigue. Pt reports "I feel like I'm getting weaker" with PT reinforcing need for OOB mobility daily & to use toilet vs purewick while in hospital. Provided pt with personal gait belt & educated her on need for physical assistance and caregiver positioning during mobility. Contacted pt's sister Danton Clap) via telephone with pt clearance - educated her on need for hands on assist with gait belt, HHPT f/u, & recommendation for pt to use RW.      Recommendations for follow up therapy are one component of a multi-disciplinary discharge planning process, led by the attending physician.  Recommendations may be updated based on patient status, additional functional criteria and insurance authorization.  Follow Up Recommendations  Home health PT     Assistance Recommended at Discharge Frequent or constant Supervision/Assistance  Patient can return home with the following A little help with walking and/or transfers;A little help with bathing/dressing/bathroom;Assistance with cooking/housework;Direct supervision/assist for financial management;Assist for transportation;Help with stairs or ramp for entrance;Direct supervision/assist for medications management   Equipment Recommendations  BSC/3in1    Recommendations for Other Services       Precautions / Restrictions  Precautions Precautions: Fall Restrictions Weight Bearing Restrictions: Yes LLE Weight Bearing: Non weight bearing     Mobility  Bed Mobility Overal bed mobility: Modified Independent Bed Mobility: Supine to Sit     Supine to sit: Modified independent (Device/Increase time), HOB elevated (bed rails)          Transfers Overall transfer level: Needs assistance Equipment used: Rolling walker (2 wheels) Transfers: Sit to/from Stand, Bed to chair/wheelchair/BSC Sit to Stand: Min guard   Step pivot transfers: Min guard       General transfer comment: Questioning cuing for safe hand placement with improving demo/recall from pt. PT provides cuing for slow, purposeful movements & pt without LOB during step pivot transfer today. Pt completes bed>recliner>bed for blocked practice.    Ambulation/Gait Ambulation/Gait assistance: Min guard Gait Distance (Feet): 30 Feet Assistive device: Rolling walker (2 wheels) Gait Pattern/deviations: Step-to pattern, Decreased step length - right, Decreased stride length Gait velocity: decreased     General Gait Details: Pt ambulates to door & back with RW & CGA with 3-4 standing rest breaks (PT educated pt that it was okay to rest L toes on floor during standing break but no weight bearing through foot & pt able to demonstrate, continues to maintain NWB LLE during gait) 2/2 fatigue. PT provides min cuing to ambulate within base of AD with pt able to return demonstrate. Pt does endorse RLE fatigue towards end of gait. Pt will get in a hurry when she is close to her destination (i.e. recliner) with PT providing education re: need to continue moving in slow, purposeful manner, otherwise pt increases speed & loses balance.   Stairs             Wheelchair Mobility    Modified Rankin (  Stroke Patients Only)       Balance Overall balance assessment: Needs assistance Sitting-balance support: Feet supported Sitting balance-Leahy Scale: Good      Standing balance support: During functional activity, Bilateral upper extremity supported Standing balance-Leahy Scale: Fair                              Cognition Arousal/Alertness: Awake/alert Behavior During Therapy: WFL for tasks assessed/performed, Anxious Overall Cognitive Status: Within Functional Limits for tasks assessed                                 General Comments: Improving carryover of education/cuing        Exercises      General Comments General comments (skin integrity, edema, etc.): Provided pt with gait belt & educated her on caregiver positioning & need for physical assist during mobility (it's not enough to simply have someone in the home with her, they need to physically assist her with gait).      Pertinent Vitals/Pain Pain Assessment Pain Assessment: No/denies pain    Home Living                          Prior Function            PT Goals (current goals can now be found in the care plan section) Acute Rehab PT Goals Patient Stated Goal: go home PT Goal Formulation: With patient Time For Goal Achievement: 07/15/21 Potential to Achieve Goals: Good Progress towards PT goals: Progressing toward goals    Frequency    7X/week      PT Plan Current plan remains appropriate    Co-evaluation              AM-PAC PT "6 Clicks" Mobility   Outcome Measure  Help needed turning from your back to your side while in a flat bed without using bedrails?: None Help needed moving from lying on your back to sitting on the side of a flat bed without using bedrails?: None Help needed moving to and from a bed to a chair (including a wheelchair)?: A Little Help needed standing up from a chair using your arms (e.g., wheelchair or bedside chair)?: A Little Help needed to walk in hospital room?: A Little Help needed climbing 3-5 steps with a railing? : Total 6 Click Score: 18    End of Session Equipment Utilized  During Treatment: Gait belt Activity Tolerance: Patient tolerated treatment well Patient left: in chair;with nursing/sitter in room (in handoff to OT)   PT Visit Diagnosis: Unsteadiness on feet (R26.81);Difficulty in walking, not elsewhere classified (R26.2);Muscle weakness (generalized) (M62.81)     Time: 5520-8022 PT Time Calculation (min) (ACUTE ONLY): 14 min  Charges:  $Therapeutic Activity: 8-22 mins                     Lavone Nian, PT, DPT 07/02/21, 9:35 AM    Waunita Schooner 07/02/2021, 9:29 AM

## 2021-07-02 NOTE — Evaluation (Signed)
Occupational Therapy Evaluation Patient Details Name: Stacy Decker MRN: [id number] DOB: 12/25/1950 Today's Date: 07/02/2021   History of Present Illness Pt is a 71 y/o F admitted on 06/29/21 for scheduled surgery. Pt underwent status post Lapidus bunionectomy, double arthrodesis left foot. Pt is NWB LLE. PMH: anxiety, depression, osteoporosis, recovering alcoholic   Clinical Impression   Ms Sigal was seen for OT evaluation this date. Prior to hospital admission, pt was Independent for mobility and I/ADLs. Pt lives alone with boyfriend/sister available near 24/7. Pt presents to acute OT demonstrating impaired ADL performance and functional mobility 2/2 decreased activity tolerance and functional strength/balance deficits. Pt currently requires CGA + RW + cues don underwear/pants - cues for technique and safety in standing. Simulated grooming with good balacne x2 trials of hips vs forearms for support on counter. All education complete and further needs can be addressed at next venue of care. Will sign off. Upon hospital discharge, recommend HHOT to maximize pt safety and return to PLOF.       Recommendations for follow up therapy are one component of a multi-disciplinary discharge planning process, led by the attending physician.  Recommendations may be updated based on patient status, additional functional criteria and insurance authorization.   Follow Up Recommendations  Home health OT    Assistance Recommended at Discharge Frequent or constant Supervision/Assistance  Patient can return home with the following A little help with walking and/or transfers;A little help with bathing/dressing/bathroom    Functional Status Assessment  Patient has had a recent decline in their functional status and demonstrates the ability to make significant improvements in function in a reasonable and predictable amount of time.  Equipment Recommendations  None recommended by OT    Recommendations for  Other Services       Precautions / Restrictions Precautions Precautions: Fall Restrictions Weight Bearing Restrictions: Yes LLE Weight Bearing: Non weight bearing      Mobility Bed Mobility               General bed mobility comments: received and left in chair    Transfers Overall transfer level: Needs assistance Equipment used: Rolling walker (2 wheels) Transfers: Sit to/from Stand Sit to Stand: Min guard           General transfer comment: good recall      Balance Overall balance assessment: Needs assistance Sitting-balance support: Feet supported Sitting balance-Leahy Scale: Good     Standing balance support: No upper extremity supported Standing balance-Leahy Scale: Fair                             ADL either performed or assessed with clinical judgement   ADL Overall ADL's : Needs assistance/impaired                                       General ADL Comments: CGA + RW + cues don underwear/pants - cues for technique and safety in standing. Simulated grooming with good balacne x2 trials of hips vs forearms for support on counter      Pertinent Vitals/Pain Pain Assessment Pain Assessment: No/denies pain     Hand Dominance Right   Extremity/Trunk Assessment Upper Extremity Assessment Upper Extremity Assessment: Overall WFL for tasks assessed   Lower Extremity Assessment Lower Extremity Assessment: Overall WFL for tasks assessed       Communication Communication Communication:  No difficulties   Cognition Arousal/Alertness: Awake/alert Behavior During Therapy: WFL for tasks assessed/performed, Anxious Overall Cognitive Status: Within Functional Limits for tasks assessed                                 General Comments: Improving carryover of education/cuing         Home Living Family/patient expects to be discharged to:: Private residence Living Arrangements: Alone Available Help at  Discharge: Available 24 hours/day;Family;Other (Comment) (boyfriend and sister) Type of Home: House Home Access: Ramped entrance Entrance Stairs-Number of Steps: 4   Home Layout: One level     Bathroom Shower/Tub: Teacher, early years/pre: Handicapped height     Home Equipment: Conservation officer, nature (2 wheels);Toilet riser;Transport chair (knee scooter)          Prior Functioning/Environment Prior Level of Function : Independent/Modified Independent;Driving;Working/employed             Mobility Comments: Pt is independent without AD at baseline, employed part time as a Research scientist (physical sciences), driving.          OT Problem List: Decreased strength;Decreased activity tolerance;Impaired balance (sitting and/or standing);Decreased safety awareness         OT Goals(Current goals can be found in the care plan section) Acute Rehab OT Goals Patient Stated Goal: to go home OT Goal Formulation: With patient Time For Goal Achievement: 07/16/21 Potential to Achieve Goals: Good   AM-PAC OT "6 Clicks" Daily Activity     Outcome Measure Help from another person eating meals?: None Help from another person taking care of personal grooming?: A Little Help from another person toileting, which includes using toliet, bedpan, or urinal?: A Little Help from another person bathing (including washing, rinsing, drying)?: A Little Help from another person to put on and taking off regular upper body clothing?: None Help from another person to put on and taking off regular lower body clothing?: A Little 6 Click Score: 20   End of Session Equipment Utilized During Treatment: Gait belt;Rolling walker (2 wheels) Nurse Communication: Mobility status  Activity Tolerance: Patient tolerated treatment well Patient left: in chair;with call bell/phone within reach;with chair alarm set  OT Visit Diagnosis: Other abnormalities of gait and mobility (R26.89)                Time: 7096-2836 OT Time  Calculation (min): 27 min Charges:  OT General Charges $OT Visit: 1 Visit OT Evaluation $OT Eval Low Complexity: 1 Low OT Treatments $Self Care/Home Management : 8-22 mins  Dessie Coma, M.S. OTR/L  07/02/21, 10:56 AM  ascom [phone]

## 2021-07-02 NOTE — Plan of Care (Signed)
Problem: Education: Goal: Knowledge of General Education information will improve Description: Including pain rating scale, medication(s)/side effects and non-pharmacologic comfort measures Outcome: Completed/Met   Problem: Clinical Measurements: Goal: Ability to maintain clinical measurements within normal limits will improve Outcome: Completed/Met Goal: Will remain free from infection Outcome: Completed/Met Goal: Cardiovascular complication will be avoided Outcome: Completed/Met   Problem: Activity: Goal: Risk for activity intolerance will decrease Outcome: Completed/Met   Problem: Elimination: Goal: Will not experience complications related to bowel motility Outcome: Completed/Met Goal: Will not experience complications related to urinary retention Outcome: Completed/Met   Problem: Safety: Goal: Ability to remain free from injury will improve Outcome: Completed/Met   Problem: Pain Managment: Goal: General experience of comfort will improve Outcome: Completed/Met   Problem: Skin Integrity: Goal: Risk for impaired skin integrity will decrease Outcome: Completed/Met   Problem: Safety: Goal: Ability to remain free from injury will improve Outcome: Completed/Met

## 2021-07-02 NOTE — Clinical Social Work Note (Signed)
Occupational Therapy * Physical Therapy * Speech Therapy          DATE _____2/6/23______________ PATIENT NAME___Myra Ball__________________ PATIENT MRN__[id number]__________________  DIAGNOSIS/DIAGNOSIS CODE __Status post left foot surgery__Z98.890__________________ DATE OF DISCHARGE: ____2/6/23__________  PRIMARY CARE PHYSICIAN ____Vickie Henson______________________ PCP PHONE/FAX____[phone]_______________________     Dear Provider (Name: __________________   Fax: ___________________________):   I certify that I have examined this patient and that occupational/physical/speech therapy is necessary on an outpatient basis.    The patient has expressed interest in completing their recommended course of therapy at your location.  Once a formal order from the patient's primary care physician has been obtained, please contact him/her to schedule an appointment for evaluation at your earliest convenience.   [ X ]  Physical Therapy Evaluate and Treat          [ X ]  Occupational Therapy Evaluate and Treat                                    [  ]  Speech Therapy Evaluate and Treat       The patient's primary care physician (listed above) must furnish and be responsible for a formal order such that the recommended services may be furnished while under the primary physician's care, and that the plan of care will be established and reviewed every 30 days (or more often if condition necessitates).

## 2021-07-02 NOTE — TOC Progression Note (Signed)
Transition of Care [facility]) - Progression Note    Patient Details  Name: Stacy Decker MRN: [id number] Date of Birth: 26-Mar-1951  Transition of [facility]) CM/SW Contact  Eileen Stanford, LCSW Phone Number: 07/02/2021, 11:28 AM  Clinical Narrative:   CSW spoke with pt at bedside. CSW is unable to secure [facility] for pt due to insurance. Pt is agreeable to outpatient therapy stating her sister will transport her to appointments. Pt prefers the outpatient clinic in [place]. CSW will send referral. Pt is waiting on ride. No additional needs.    Expected Discharge Plan: Home/Self Care Barriers to Discharge: Continued Medical Work up  Expected Discharge Plan and Services Expected Discharge Plan: Home/Self Care In-house Referral: Clinical Social Work   Post Acute Care Choice: Durable Medical Equipment (Knee scooter, walker, raised toilet seat with handles, bath chair) Living arrangements for the past 2 months: Single Family Home Expected Discharge Date: 07/02/21                                     Social Determinants of Health (SDOH) Interventions    Readmission Risk Interventions No flowsheet data found.

## 2021-07-05 ENCOUNTER — Ambulatory Visit (INDEPENDENT_AMBULATORY_CARE_PROVIDER_SITE_OTHER): Payer: PPO | Admitting: Podiatry

## 2021-07-05 ENCOUNTER — Other Ambulatory Visit: Payer: Self-pay

## 2021-07-05 ENCOUNTER — Ambulatory Visit (INDEPENDENT_AMBULATORY_CARE_PROVIDER_SITE_OTHER): Payer: PPO

## 2021-07-05 DIAGNOSIS — M25572 Pain in left ankle and joints of left foot: Secondary | ICD-10-CM | POA: Diagnosis not present

## 2021-07-05 DIAGNOSIS — M76822 Posterior tibial tendinitis, left leg: Secondary | ICD-10-CM | POA: Diagnosis not present

## 2021-07-05 DIAGNOSIS — M2142 Flat foot [pes planus] (acquired), left foot: Secondary | ICD-10-CM

## 2021-07-09 ENCOUNTER — Encounter: Payer: Self-pay | Admitting: Podiatry

## 2021-07-09 NOTE — Progress Notes (Signed)
°  Subjective:  Patient ID: Stacy Decker, female    DOB: 03/29/1951,  MRN: [id number]  Chief Complaint  Patient presents with   Routine Post Op    (xray)POV #1 DOS 06/29/2021 LT FOOT CALF MUSCLE LENGTHENING, REARFOOT FUSION, MIDFOOT FUSION OR OSTEOTOMY, BONE GRAFTING FROM LEG     71 y.o. female returns for post-op check.  Pain is improving  Review of Systems: Negative except as noted in the HPI. Denies N/V/F/Ch.   Objective:  There were no vitals filed for this visit. There is no height or weight on file to calculate BMI. Constitutional Well developed. Well nourished.  Vascular Foot warm and well perfused. Capillary refill normal to all digits.  Calf is soft and supple, no posterior calf or knee pain, negative Homans' sign  Neurologic Normal speech. Oriented to person, place, and time. Epicritic sensation to light touch grossly present bilaterally.  Dermatologic Skin healing well without signs of infection. Skin edges well coapted without signs of infection.  Moderate edema and ecchymosis noted  Orthopedic: Tenderness to palpation noted about the surgical site.   Multiple view plain film radiographs: Status post double arthrodesis and Lapidus bunionectomy, good position of hardware no changes for immediate postoperative films good correction noted although this is a nonweightbearing film Assessment:   1. Acquired pes planus, left   2. Posterior tibial tendon dysfunction, left   3. Sinus tarsi syndrome, left    Plan:  Patient was evaluated and treated and all questions answered.  S/p foot surgery left -Progressing as expected post-operatively. -XR: Noted above -WB Status: NWB in below-knee cast -Sutures: Leave for 2 weeks. -Medications: No refills required -Foot redressed.  Well-padded below-knee cast reapplied.  We will change in 2 weeks for suture removal and then reapply for 5 weeks  Return in about 2 weeks (around 07/19/2021) for cast change , post op (no x-rays),  suture removal.

## 2021-07-10 ENCOUNTER — Telehealth: Payer: Self-pay | Admitting: Family Medicine

## 2021-07-10 NOTE — Telephone Encounter (Signed)
Left message for patient to call back and schedule Medicare Annual Wellness Visit (AWV) either virtually or in office. I left my number for patient to call [phone].  Last AWV 09/21/20  please schedule at anytime with health coach  This should be a 45 minute visit.    Awv can be schedule calendar year with healthteam

## 2021-07-12 ENCOUNTER — Encounter: Payer: Self-pay | Admitting: Podiatry

## 2021-07-13 ENCOUNTER — Telehealth: Payer: Self-pay | Admitting: *Deleted

## 2021-07-13 NOTE — Telephone Encounter (Signed)
Returned the call to patient giving recommendations per Dr Posey Pronto. She verbalized understanding

## 2021-07-13 NOTE — Telephone Encounter (Signed)
Patient is calling to ask if she can put pressure on her foot and can she transition from the walker to the scooter to get around the house? Please advise.

## 2021-07-16 NOTE — Telephone Encounter (Signed)
Called patient per Dr Sherryle Lis, no answer, left vmessage of his  recommendations.

## 2021-07-19 ENCOUNTER — Ambulatory Visit (INDEPENDENT_AMBULATORY_CARE_PROVIDER_SITE_OTHER): Payer: PPO | Admitting: Podiatry

## 2021-07-19 ENCOUNTER — Other Ambulatory Visit: Payer: Self-pay

## 2021-07-19 ENCOUNTER — Encounter: Payer: PPO | Admitting: Podiatry

## 2021-07-19 ENCOUNTER — Encounter: Payer: Self-pay | Admitting: Podiatry

## 2021-07-19 DIAGNOSIS — M2142 Flat foot [pes planus] (acquired), left foot: Secondary | ICD-10-CM

## 2021-07-19 DIAGNOSIS — M25572 Pain in left ankle and joints of left foot: Secondary | ICD-10-CM

## 2021-07-19 DIAGNOSIS — M76822 Posterior tibial tendinitis, left leg: Secondary | ICD-10-CM

## 2021-07-19 NOTE — Progress Notes (Signed)
°  Subjective:  Patient ID: Stacy Decker, female    DOB: 18-Jul-1950,  MRN: [id number]  Chief Complaint  Patient presents with   Routine Post Op    cast change , post op (no x-rays), suture removal. POV #2 DOS 06/29/2021 LT FOOT CALF MUSCLE LENGTHENING, REARFOOT FUSION, MIDFOOT FUSION OR OSTEOTOMY, BONE GRAFTING FROM LEG     71 y.o. female returns for post-op check.  Doing okay not having much pain only taking Tylenol and gabapentin  Review of Systems: Negative except as noted in the HPI. Denies N/V/F/Ch.   Objective:  There were no vitals filed for this visit. There is no height or weight on file to calculate BMI. Constitutional Well developed. Well nourished.  Vascular Foot warm and well perfused. Capillary refill normal to all digits.  Calf is soft and supple, no posterior calf or knee pain, negative Homans' sign  Neurologic Normal speech. Oriented to person, place, and time. Epicritic sensation to light touch grossly present bilaterally.  Dermatologic Skin healing well without signs of infection. Skin edges well coapted without signs of infection.  Edema has improved  Orthopedic: Tenderness to palpation noted about the surgical site.   Multiple view plain film radiographs: Status post double arthrodesis and Lapidus bunionectomy, good position of hardware no changes for immediate postoperative films good correction noted although this is a nonweightbearing film Assessment:   1. Acquired pes planus, left   2. Posterior tibial tendon dysfunction, left   3. Sinus tarsi syndrome, left    Plan:  Patient was evaluated and treated and all questions answered.  S/p foot surgery left -Progressing as expected post-operatively. -XR: Noted above new radiographs next visit -WB Status: NWB in below-knee cast -Sutures: Removed today -Medications: No refills required -Foot redressed.  Well-padded below-knee cast reapplied.   -She may return to work as long as she continues  nonweightbearing and can keep the foot elevated and seated and I provided a letter for this.  Return in about 5 weeks (around 08/21/2021) for post op (new x-rays), cast removal.

## 2021-07-20 ENCOUNTER — Ambulatory Visit (INDEPENDENT_AMBULATORY_CARE_PROVIDER_SITE_OTHER): Payer: PPO

## 2021-07-20 VITALS — Ht 60.0 in | Wt 148.0 lb

## 2021-07-20 DIAGNOSIS — Z0000 Encounter for general adult medical examination without abnormal findings: Secondary | ICD-10-CM | POA: Diagnosis not present

## 2021-07-20 NOTE — Progress Notes (Addendum)
I connected with  Stacy Decker today via telehealth video enabled device and verified that I am speaking with the correct person using two identifiers.   Location: Patient: home Provider: work  Persons participating in virtual visit: Keilee Diona Foley, Glenna Durand LPN  I discussed the limitations, risks, security and privacy concerns of performing an evaluation and management service by video and the availability of in person appointments. The patient expressed understanding and agreed to proceed.   Some vital signs may be absent or patient reported.     Subjective:   Stacy Decker is a 71 y.o. female who presents for Medicare Annual (Subsequent) preventive examination.  Review of Systems     Cardiac Risk Factors include: advanced age (>87mn, >>38women)     Objective:    Today's Vitals   07/20/21 1420  Weight: 148 lb (67.1 kg)  Height: 5' (1.524 m)   Body mass index is 28.9 kg/m.  Advanced Directives 07/20/2021 06/29/2021 06/20/2021 02/28/2021 05/29/2017 07/29/2016  Does Patient Have a Medical Advance Directive? Yes Yes Yes No Yes Yes  Type of AParamedicof [place] will [place] will - - (No Data) HW[place] RuinsLiving will  Does patient want to make changes to medical advance directive? - No - Patient declined - - Yes (ED - Information included in AVS) -  Copy of [place] Chart? Yes - validated most recent copy scanned in chart (See row information) Yes - validated most recent copy scanned in chart (See row information) - - - No - copy requested  Would patient like information on creating a medical advance directive? - - - No - Patient declined - -    Current Medications (verified) Outpatient Encounter Medications as of 07/20/2021  Medication Sig   buPROPion (WELLBUTRIN XL) 150 MG 24 hr tablet Take 150 mg by mouth every morning.   busPIRone (BUSPAR) 7.5 MG tablet Take 7.5 mg by  mouth 2 (two) times daily as needed (anxiety).   Calcium Carb-Cholecalciferol (CALCIUM 1000 + D) 1000-20 MG-MCG TABS    cholecalciferol (VITAMIN D) 1000 units tablet Take 1,000 Units by mouth daily.   citalopram (CELEXA) 40 MG tablet Take 40 mg by mouth every evening.   gabapentin (NEURONTIN) 300 MG capsule Take 1 capsule (300 mg total) by mouth 3 (three) times daily.   glucosamine-chondroitin 500-400 MG tablet Take 1 tablet by mouth daily.   Multiple Vitamin (MULTIVITAMIN) tablet Take 1 tablet by mouth daily.   rivaroxaban (XARELTO) 10 MG TABS tablet Take 1 tablet (10 mg total) by mouth daily with supper.   No facility-administered encounter medications on file as of 07/20/2021.    Allergies (verified) Patient has no known allergies.   History: Past Medical History:  Diagnosis Date   Anxiety    Depression    History of vertigo    Osteoporosis    Substance abuse ([place]    recovering alcoholic-sober since [address]  Past Surgical History:  Procedure Laterality Date   AUGMENTATION MAMMAPLASTY Bilateral    BUNIONECTOMY Left 06/29/2021   Procedure: BUNIONECTOMY;  Surgeon: MCriselda Peaches DPM;  Location: [facility] ORS;  Service: Podiatry;  Laterality: Left;  POPLITEAL AND SAPHENOUS BLOCK   carpel tunnel Bilateral    COLONOSCOPY     FLAT FOOT CORRECTION Left 06/29/2021   Procedure: FLAT FOOT CORRECTION;  Surgeon: MCriselda Peaches DPM;  Location: [facility] ORS;  Service: Podiatry;  Laterality: Left;   PLACEMENT OF BREAST  Family History  °Problem Relation Age of Onset  ° Hypertension Mother   ° Cancer Mother   °     breast in remission age 88  ° Diabetes Mother   ° Diabetes Paternal Grandmother   ° Dementia Father   °     UTIs   ° Other Father   °     brain tumor  ° Other Sister   °     hip replacements  ° Alcohol abuse Sister   ° Other Sister   °     breast cysts- benign  ° Colon cancer Neg Hx   ° Stomach cancer Neg Hx   ° Rectal cancer Neg Hx   ° Esophageal cancer Neg Hx   ° Liver cancer  Neg Hx   ° Breast cancer Neg Hx   ° BRCA 1/2 Neg Hx   ° °Social History  ° °Socioeconomic History  ° Marital status: Divorced  °  Spouse name: Not on file  ° Number of children: 0  ° Years of education: Not on file  ° Highest education level: Not on file  °Occupational History  ° Occupation: Therapy asst At [employer]  °Tobacco Use  ° Smoking status: Former  °  Packs/day: 1.50  °  Years: 25.00  °  Pack years: 37.50  °  Types: Cigarettes  °  Quit date: 06/28/1996  °  Years since quitting: 25.0  °  Passive exposure: Past  ° Smokeless tobacco: Never  °Vaping Use  ° Vaping Use: Never used  °Substance and Sexual Activity  ° Alcohol use: No  °  Alcohol/week: 0.0 standard drinks  °  Comment: recovering alcoholic quit in 1988  ° Drug use: No  °  Comment: former marijuana  ° Sexual activity: Not Currently  °  Birth control/protection: Post-menopausal  °Other Topics Concern  ° Not on file  °Social History Narrative  ° Not on file  ° °Social Determinants of Health  ° °Financial Resource Strain: Low Risk   ° Difficulty of Paying Living Expenses: Not hard at all  °Food Insecurity: No Food Insecurity  ° Worried About Running Out of Food in the Last Year: Never true  ° Ran Out of Food in the Last Year: Never true  °Transportation Needs: No Transportation Needs  ° Lack of Transportation (Medical): No  ° Lack of Transportation (Non-Medical): No  °Physical Activity: Inactive  ° Days of Exercise per Week: 0 days  ° Minutes of Exercise per Session: 0 min  °Stress: No Stress Concern Present  ° Feeling of Stress : Not at all  °Social Connections: Not on file  ° ° °Tobacco Counseling °Counseling given: Not Answered ° ° °Clinical Intake: ° °Pre-visit preparation completed: Yes ° °Pain : No/denies pain ° °  ° °Nutritional Status: BMI 25 -29 Overweight °Nutritional Risks: None °Diabetes: No ° °How often do you need to have someone help you when you read instructions, pamphlets, or other written materials from your doctor or pharmacy?: 1 -  Never °What is the last grade level you completed in school?: 14 yrs ° °Diabetic? no ° °Interpreter Needed?: No ° °Information entered by :: NAllen LPN ° ° °Activities of Daily Living °In your present state of health, do you have any difficulty performing the following activities: 07/20/2021 06/30/2021  °Hearing? N Y  °Vision? N Y  °Difficulty concentrating or making decisions? N Y  °Walking or climbing stairs? Y Y  °Comment due to cast -  °Dressing or   cast -  Dressing or bathing? Y N  Comment due to cast -  Doing errands, shopping? Y N  Comment due to cast -  Preparing Food and eating ? Y -  Comment due to cast -  Using the Toilet? N -  In the past six months, have you accidently leaked urine? Y -  Do you have problems with loss of bowel control? N -  Managing your Medications? N -  Managing your Finances? N -  Housekeeping or managing your Housekeeping? Y -  Some recent data might be hidden    Patient Care Team: Girtha Rm, PA-C (Inactive) as PCP - General (Family Medicine)  Indicate any recent Medical Services you may have received from other than [facility] providers in the past year (date may be approximate).     Assessment:   This is a routine wellness examination for Stacy Decker.  Hearing/Vision screen Vision Screening - Comments:: Regular eye exams, [employer]  Dietary issues and exercise activities discussed: Current Exercise Habits: The patient does not participate in regular exercise at present   Goals Addressed             This Visit's Progress    Patient Stated       07/20/2021, wants to heal from surgery       Depression Screen PHQ 2/9 Scores 07/20/2021 09/21/2020 02/09/2020 08/09/2019 07/31/2018 05/29/2017 02/19/2016  PHQ - 2 Score 0 0 0 6 0 0 1  PHQ- 9 Score - 0 - 13 - - -    Fall Risk Fall Risk  07/20/2021 09/21/2020 02/09/2020 08/09/2019 07/31/2018  Falls in the past year? 0 0 0 1 1  Comment - - - - -  Number falls in past yr: - 0 0 1 1  Comment - - - - -  Injury with Fall? - 0 0 1 1   Risk for fall due to : Impaired balance/gait;Impaired mobility;Medication side effect No Fall Risks - - -  Risk for fall due to: Comment - - - - -  Follow up Falls evaluation completed;Education provided;Falls prevention discussed Falls evaluation completed - - -    FALL RISK PREVENTION PERTAINING TO THE HOME:  Any stairs in or around the home? Yes  If so, are there any without handrails? No  Home free of loose throw rugs in walkways, pet beds, electrical cords, etc? Yes  Adequate lighting in your home to reduce risk of falls? Yes   ASSISTIVE DEVICES UTILIZED TO PREVENT FALLS:  Life alert? No  Use of a cane, walker or w/c? Yes  Grab bars in the bathroom? Yes  Shower chair or bench in shower? Yes  Elevated toilet seat or a handicapped toilet? Yes   TIMED UP AND GO:  Was the test performed? No .      Cognitive Function:     6CIT Screen 05/29/2017  What Year? 0 points  What month? 0 points  What time? 0 points  Count back from 20 0 points  Months in reverse 0 points  Repeat phrase 0 points  Total Score 0    Immunizations Immunization History  Administered Date(s) Administered   Fluad Quad(high Dose 65+) 02/09/2020   Influenza, High Dose Seasonal PF 01/30/2016, 03/09/2018   Influenza-Unspecified 03/10/2012, 02/12/2013, 03/11/2015, 02/28/2017, 02/15/2021   PFIZER(Purple Top)SARS-COV-2 Vaccination 07/10/2019, 08/02/2019, 02/07/2020   Pneumococcal Conjugate-13 10/11/2014   Pneumococcal Polysaccharide-23 02/19/2016   Tdap 09/10/2013   Zoster, Live 03/10/2012    TDAP status: Up to date  Flu Vaccine status: Up to date  Pneumococcal vaccine status: Up to date  Covid-19 vaccine status: Completed vaccines  Qualifies for Shingles Vaccine? Yes   Zostavax completed Yes   Shingrix Completed?: need second dose  Screening Tests Health Maintenance  Topic Date Due   Zoster Vaccines- Shingrix (1 of 2) Never done   COVID-19 Vaccine (4 - Booster for Pfizer series)  04/03/2020   MAMMOGRAM  08/03/2022   TETANUS/TDAP  09/11/2023   COLONOSCOPY (Pts 45-30yr Insurance coverage will need to be confirmed)  07/30/2026   Pneumonia Vaccine 72 Years old  Completed   INFLUENZA VACCINE  Completed   DEXA SCAN  Completed   Hepatitis C Screening  Completed   HPV VACCINES  Aged Out    Health Maintenance  Health Maintenance Due  Topic Date Due   Zoster Vaccines- Shingrix (1 of 2) Never done   COVID-19 Vaccine (4 - Booster for Pfizer series) 04/03/2020    Colorectal cancer screening: Type of screening: Colonoscopy. Completed 07/29/2016. Repeat every 10 years  Mammogram status: Completed 08/02/2020. Repeat every year  Bone Density status: Completed 04/11/2021.   Lung Cancer Screening: (Low Dose CT Chest recommended if Age 72-80years, 30 pack-year currently smoking OR have quit w/in 15years.) does not qualify.   Lung Cancer Screening Referral: no  Additional Screening:  Hepatitis C Screening: does qualify; Completed 01/30/2016  Vision Screening: Recommended annual ophthalmology exams for early detection of glaucoma and other disorders of the eye. Is the patient up to date with their annual eye exam?  Yes  Who is the provider or what is the name of the office in which the patient attends annual eye exams[email or web address] is not established with a provider, would they like to be referred to a provider to establish care? No .   Dental Screening: Recommended annual dental exams for proper oral hygiene  Community Resource Referral / Chronic Care Management: CRR required this visit?  No   CCM required this visit?  No      Plan:     I have personally reviewed and noted the following in the patients chart:   Medical and social history Use of alcohol, tobacco or illicit drugs  Current medications and supplements including opioid prescriptions.  Functional ability and status Nutritional status Physical activity Advanced directives List of other  physicians Hospitalizations, surgeries, and ER visits in previous 12 months Vitals Screenings to include cognitive, depression, and falls Referrals and appointments  In addition, I have reviewed and discussed with patient certain preventive protocols, quality metrics, and best practice recommendations. A written personalized care plan for preventive services as well as general preventive health recommendations were provided to patient.     NKellie Simmering LPN   21/61/0960  Nurse Notes: 6 CIT not administered. Patient has normal cognition per conversation.  Due to this being a virtual visit, the after visit summary with patients personalized plan was offered to patient via mail or my-chart. Patient would like to access on my-chart

## 2021-07-20 NOTE — Patient Instructions (Signed)
Stacy Decker , Thank you for taking time to come for your Medicare Wellness Visit. I appreciate your ongoing commitment to your health goals. Please review the following plan we discussed and let me know if I can assist you in the future.   Screening recommendations/referrals: Colonoscopy: completed 07/29/2016, due 07/30/2026 Mammogram: completed 08/02/2020, due 08/03/2021 Bone Density: completed 04/11/2021 Recommended yearly ophthalmology/optometry visit for glaucoma screening and checkup Recommended yearly dental visit for hygiene and checkup  Vaccinations: Influenza vaccine: completed 02/15/2021, due next flu season Pneumococcal vaccine: completed 02/19/2016 Tdap vaccine: completed 09/10/2013 Shingles vaccine: needs second dose   Covid-19: 05/18/2021, 02/07/2020, 08/02/2019, 07/10/2019  Advanced directives: copy in chart  Conditions/risks identified: recovering from foot surgery  Next appointment: Follow up in one year for your annual wellness visit    Preventive Care 72 Years and Older, Female Preventive care refers to lifestyle choices and visits with your health care provider that can promote health and wellness. What does preventive care include? A yearly physical exam. This is also called an annual well check. Dental exams once or twice a year. Routine eye exams. Ask your health care provider how often you should have your eyes checked. Personal lifestyle choices, including: Daily care of your teeth and gums. Regular physical activity. Eating a healthy diet. Avoiding tobacco and drug use. Limiting alcohol use. Practicing safe sex. Taking low-dose aspirin every day. Taking vitamin and mineral supplements as recommended by your health care provider. What happens during an annual well check? The services and screenings done by your health care provider during your annual well check will depend on your age, overall health, lifestyle risk factors, and family history of disease. Counseling   Your health care provider may ask you questions about your: Alcohol use. Tobacco use. Drug use. Emotional well-being. Home and relationship well-being. Sexual activity. Eating habits. History of falls. Memory and ability to understand (cognition). Work and work Statistician. Reproductive health. Screening  You may have the following tests or measurements: Height, weight, and BMI. Blood pressure. Lipid and cholesterol levels. These may be checked every 5 years, or more frequently if you are over 39 years old. Skin check. Lung cancer screening. You may have this screening every year starting at age 17 if you have a 30-pack-year history of smoking and currently smoke or have quit within the past 15 years. Fecal occult blood test (FOBT) of the stool. You may have this test every year starting at age 49. Flexible sigmoidoscopy or colonoscopy. You may have a sigmoidoscopy every 5 years or a colonoscopy every 10 years starting at age 14. Hepatitis C blood test. Hepatitis B blood test. Sexually transmitted disease (STD) testing. Diabetes screening. This is done by checking your blood sugar (glucose) after you have not eaten for a while (fasting). You may have this done every 1-3 years. Bone density scan. This is done to screen for osteoporosis. You may have this done starting at age 30. Mammogram. This may be done every 1-2 years. Talk to your health care provider about how often you should have regular mammograms. Talk with your health care provider about your test results, treatment options, and if necessary, the need for more tests. Vaccines  Your health care provider may recommend certain vaccines, such as: Influenza vaccine. This is recommended every year. Tetanus, diphtheria, and acellular pertussis (Tdap, Td) vaccine. You may need a Td booster every 10 years. Zoster vaccine. You may need this after age 28. Pneumococcal 13-valent conjugate (PCV13) vaccine. One dose is recommended  after age 25. Pneumococcal polysaccharide (PPSV23) vaccine. One dose is recommended after age 51. Talk to your health care provider about which screenings and vaccines you need and how often you need them. This information is not intended to replace advice given to you by your health care provider. Make sure you discuss any questions you have with your health care provider. Document Released: 06/09/2015 Document Revised: 01/31/2016 Document Reviewed: 03/14/2015 Elsevier Interactive Patient Education  2017 [place] Prevention in the Home Falls can cause injuries. They can happen to people of all ages. There are many things you can do to make your home safe and to help prevent falls. What can I do on the outside of my home? Regularly fix the edges of walkways and driveways and fix any cracks. Remove anything that might make you trip as you walk through a door, such as a raised step or threshold. Trim any bushes or trees on the path to your home. Use bright outdoor lighting. Clear any walking paths of anything that might make someone trip, such as rocks or tools. Regularly check to see if handrails are loose or broken. Make sure that both sides of any steps have handrails. Any raised decks and porches should have guardrails on the edges. Have any leaves, snow, or ice cleared regularly. Use sand or salt on walking paths during winter. Clean up any spills in your garage right away. This includes oil or grease spills. What can I do in the bathroom? Use night lights. Install grab bars by the toilet and in the tub and shower. Do not use towel bars as grab bars. Use non-skid mats or decals in the tub or shower. If you need to sit down in the shower, use a plastic, non-slip stool. Keep the floor dry. Clean up any water that spills on the floor as soon as it happens. Remove soap buildup in the tub or shower regularly. Attach bath mats securely with double-sided non-slip rug tape. Do not  have throw rugs and other things on the floor that can make you trip. What can I do in the bedroom? Use night lights. Make sure that you have a light by your bed that is easy to reach. Do not use any sheets or blankets that are too big for your bed. They should not hang down onto the floor. Have a firm chair that has side arms. You can use this for support while you get dressed. Do not have throw rugs and other things on the floor that can make you trip. What can I do in the kitchen? Clean up any spills right away. Avoid walking on wet floors. Keep items that you use a lot in easy-to-reach places. If you need to reach something above you, use a strong step stool that has a grab bar. Keep electrical cords out of the way. Do not use floor polish or wax that makes floors slippery. If you must use wax, use non-skid floor wax. Do not have throw rugs and other things on the floor that can make you trip. What can I do with my stairs? Do not leave any items on the stairs. Make sure that there are handrails on both sides of the stairs and use them. Fix handrails that are broken or loose. Make sure that handrails are as long as the stairways. Check any carpeting to make sure that it is firmly attached to the stairs. Fix any carpet that is loose or worn. Avoid having throw rugs  at the top or bottom of the stairs. If you do have throw rugs, attach them to the floor with carpet tape. Make sure that you have a light switch at the top of the stairs and the bottom of the stairs. If you do not have them, ask someone to add them for you. What else can I do to help prevent falls? Wear shoes that: Do not have high heels. Have rubber bottoms. Are comfortable and fit you well. Are closed at the toe. Do not wear sandals. If you use a stepladder: Make sure that it is fully opened. Do not climb a closed stepladder. Make sure that both sides of the stepladder are locked into place. Ask someone to hold it for  you, if possible. Clearly mark and make sure that you can see: Any grab bars or handrails. First and last steps. Where the edge of each step is. Use tools that help you move around (mobility aids) if they are needed. These include: Canes. Walkers. Scooters. Crutches. Turn on the lights when you go into a dark area. Replace any light bulbs as soon as they burn out. Set up your furniture so you have a clear path. Avoid moving your furniture around. If any of your floors are uneven, fix them. If there are any pets around you, be aware of where they are. Review your medicines with your doctor. Some medicines can make you feel dizzy. This can increase your chance of falling. Ask your doctor what other things that you can do to help prevent falls. This information is not intended to replace advice given to you by your health care provider. Make sure you discuss any questions you have with your health care provider. Document Released: 03/09/2009 Document Revised: 10/19/2015 Document Reviewed: 06/17/2014 Elsevier Interactive Patient Education  2017 [employer].

## 2021-08-02 ENCOUNTER — Telehealth: Payer: Self-pay | Admitting: Physician Assistant

## 2021-08-02 NOTE — Telephone Encounter (Signed)
Left message for pt to call concerning Prolia. JCL had recommend to for medication and I had approved. Called pt and advised and she stated she wanted to postpone to upcoming surgery scheduled for 06/29/2021. Call pt today and left message inquiring if pt would like to set up an appt.  ?

## 2021-08-09 ENCOUNTER — Encounter: Payer: PPO | Admitting: Podiatry

## 2021-08-13 ENCOUNTER — Other Ambulatory Visit: Payer: Self-pay | Admitting: Family Medicine

## 2021-08-13 DIAGNOSIS — Z1231 Encounter for screening mammogram for malignant neoplasm of breast: Secondary | ICD-10-CM

## 2021-08-15 ENCOUNTER — Telehealth: Payer: Self-pay | Admitting: Physician Assistant

## 2021-08-15 NOTE — Telephone Encounter (Signed)
Dr. Redmond School saw this patient at the November and asked me to pursue Prolia for this pt.Verification was completed and I attempted to get her to schedule prior to the end of the year but pt would not commit to starting. Spoke to pt in January and explained that insurance verification would need to be reran due to new year and policies change every year. That process was completed and I attempted to contact her. Finally made contact with her on 06/22/2021 and she stated that she was having surgery on 06/29/2021 and would call me back to schedule. I did not hear from her so on 08/02/2021 I left a message for her to call me back concerning Prolia, she did not. I let another message today. I am not sure what to do with her at this point. She has not seen you yet but will be your patient. Please advise.  ?

## 2021-08-20 NOTE — Telephone Encounter (Signed)
If she calls back, please make her an appointment with me. Thank you for following up on this.

## 2021-08-21 ENCOUNTER — Encounter: Payer: Self-pay | Admitting: Podiatry

## 2021-08-21 ENCOUNTER — Ambulatory Visit (INDEPENDENT_AMBULATORY_CARE_PROVIDER_SITE_OTHER): Payer: PPO | Admitting: Podiatry

## 2021-08-21 ENCOUNTER — Ambulatory Visit (INDEPENDENT_AMBULATORY_CARE_PROVIDER_SITE_OTHER): Payer: PPO

## 2021-08-21 ENCOUNTER — Other Ambulatory Visit: Payer: Self-pay

## 2021-08-21 DIAGNOSIS — M2012 Hallux valgus (acquired), left foot: Secondary | ICD-10-CM

## 2021-08-21 DIAGNOSIS — M76822 Posterior tibial tendinitis, left leg: Secondary | ICD-10-CM | POA: Diagnosis not present

## 2021-08-21 DIAGNOSIS — M21862 Other specified acquired deformities of left lower leg: Secondary | ICD-10-CM

## 2021-08-21 DIAGNOSIS — M21612 Bunion of left foot: Secondary | ICD-10-CM | POA: Diagnosis not present

## 2021-08-21 DIAGNOSIS — M2142 Flat foot [pes planus] (acquired), left foot: Secondary | ICD-10-CM

## 2021-08-21 NOTE — Telephone Encounter (Signed)
Please advise 

## 2021-08-22 NOTE — Telephone Encounter (Signed)
Please advise 

## 2021-08-23 ENCOUNTER — Encounter: Payer: Self-pay | Admitting: Podiatry

## 2021-08-24 ENCOUNTER — Encounter: Payer: Self-pay | Admitting: Podiatry

## 2021-08-24 NOTE — Progress Notes (Signed)
?  Subjective:  ?Patient ID: Stacy Decker, female    DOB: 1951/03/03,  MRN: [id number] ? ?Chief Complaint  ?Patient presents with  ? Routine Post Op  ?   POV #3 DOS 06/29/2021 LT FOOT CALF MUSCLE LENGTHENING, REARFOOT FUSION, MIDFOOT FUSION OR OSTEOTOMY, BONE GRAFTING FROM LEG  ? ? ? ?71 y.o. female returns for post-op check.  Overall doing well not having tons of pain ? ?Review of Systems: Negative except as noted in the HPI. Denies N/V/F/Ch. ? ? ?Objective:  ?There were no vitals filed for this visit. ?There is no height or weight on file to calculate BMI. ?Constitutional Well developed. ?Well nourished.  ?Vascular Foot warm and well perfused. ?Capillary refill normal to all digits.  Calf is soft and supple, no posterior calf or knee pain, negative Homans' sign  ?Neurologic Normal speech. ?Oriented to person, place, and time. ?Epicritic sensation to light touch grossly present bilaterally.  ?Dermatologic Skin healing well without signs of infection. Skin edges well coapted without signs of infection.  Edema has improved  ?Orthopedic: Tenderness to palpation noted about the surgical site.  ? ?Multiple view plain film radiographs: New films today show good consolidation across subtalar and talonavicular joints, Lapidus arthrodesis is taking longer still some lucency here ?Assessment:  ? ?1. Hallux valgus with bunions, left   ?2. Posterior tibial tendon dysfunction, left   ?3. Acquired pes planus, left   ? ?Plan:  ?Patient was evaluated and treated and all questions answered. ? ?S/p foot surgery left ?-Overall doing well the cast was removed today and she was transition to a cam walker boot for protected weightbearing.  We reviewed the transition process.  Begin physical therapy for strengthening and conditioning.  A referral was sent for this at the [facility] location.  I will see her back in 5 weeks for new x-rays ?Return in about 5 weeks (around 09/27/2021) for post op (new x-rays).  ?

## 2021-08-28 ENCOUNTER — Ambulatory Visit
Admission: RE | Admit: 2021-08-28 | Discharge: 2021-08-28 | Disposition: A | Discharge status: Home / Self Care | Payer: PPO | Source: Ambulatory Visit | Attending: Family Medicine | Admitting: Family Medicine

## 2021-08-28 ENCOUNTER — Encounter: Payer: Self-pay | Admitting: Podiatry

## 2021-08-28 DIAGNOSIS — Z1231 Encounter for screening mammogram for malignant neoplasm of breast: Secondary | ICD-10-CM

## 2021-09-05 ENCOUNTER — Encounter: Payer: Self-pay | Admitting: Podiatry

## 2021-09-11 DIAGNOSIS — F331 Major depressive disorder, recurrent, moderate: Secondary | ICD-10-CM | POA: Diagnosis not present

## 2021-09-11 DIAGNOSIS — F411 Generalized anxiety disorder: Secondary | ICD-10-CM | POA: Diagnosis not present

## 2021-09-12 ENCOUNTER — Encounter: Payer: Self-pay | Admitting: Physical Therapy

## 2021-09-12 ENCOUNTER — Ambulatory Visit: Payer: PPO | Attending: Podiatry | Admitting: Physical Therapy

## 2021-09-12 DIAGNOSIS — M2012 Hallux valgus (acquired), left foot: Secondary | ICD-10-CM | POA: Diagnosis not present

## 2021-09-12 DIAGNOSIS — M21612 Bunion of left foot: Secondary | ICD-10-CM | POA: Diagnosis not present

## 2021-09-12 DIAGNOSIS — M2142 Flat foot [pes planus] (acquired), left foot: Secondary | ICD-10-CM | POA: Insufficient documentation

## 2021-09-12 DIAGNOSIS — M76822 Posterior tibial tendinitis, left leg: Secondary | ICD-10-CM | POA: Insufficient documentation

## 2021-09-12 DIAGNOSIS — R262 Difficulty in walking, not elsewhere classified: Secondary | ICD-10-CM | POA: Insufficient documentation

## 2021-09-12 NOTE — Therapy (Signed)
?OUTPATIENT PHYSICAL THERAPY LOWER EXTREMITY EVALUATION ? ? ?Patient Name: Betsy Rosello ?MRN: [id number] ?DOB:[birth year], 71 y.o., female ?Today's Date: 09/12/2021 ? ? PT End of Session - 09/12/21 1132   ? ? Visit Number 1   ? Number of Visits --   1-2x/week  ? Date for PT Re-Evaluation 11/07/21   ? Authorization Type [employer] - FOTO - MCR   ? Progress Note Due on Visit 10   ? PT Start Time 1050   ? PT Stop Time 1125   ? PT Time Calculation (min) 35 min   ? ?  ?  ? ?  ? ? ?Past Medical History:  ?Diagnosis Date  ? Anxiety   ? Depression   ? History of vertigo   ? Osteoporosis   ? Substance abuse ([place])   ? recovering alcoholic-sober since 4782  ? ?Past Surgical History:  ?Procedure Laterality Date  ? AUGMENTATION MAMMAPLASTY Bilateral   ? BUNIONECTOMY Left 06/29/2021  ? Procedure: BUNIONECTOMY;  Surgeon: Criselda Peaches, DPM;  Location: [facility] ORS;  Service: Podiatry;  Laterality: Left;  POPLITEAL AND SAPHENOUS BLOCK  ? carpel tunnel Bilateral   ? COLONOSCOPY    ? FLAT FOOT CORRECTION Left 06/29/2021  ? Procedure: FLAT FOOT CORRECTION;  Surgeon: Criselda Peaches, DPM;  Location: [facility] ORS;  Service: Podiatry;  Laterality: Left;  ? PLACEMENT OF BREAST IMPLANTS    ? ?Patient Active Problem List  ? Diagnosis Date Noted  ? Pes planus   ? Arthritis of left subtalar joint   ? Acquired hallux valgus of left foot   ? Status post left foot surgery 06/29/2021  ? History of tobacco abuse 06/29/2021  ? Recovering alcoholic ([place]) 95/62/1308  ? Spider veins 02/09/2020  ? Obesity (BMI 30-39.9) 07/31/2018  ? Estrogen deficiency 07/31/2018  ? Prediabetes 05/29/2017  ? Anxiety and depression 05/29/2017  ? Screening for cervical cancer 05/29/2017  ? DVT prophylaxis 05/29/2017  ? Osteoporosis without current pathological fracture 03/12/2016  ? ? ?PCP: Irene Pap, PA-C ? ?REFERRING PROVIDER: Criselda Peaches, DPM ? ?THERAPY DIAG:  ?Hallux valgus with bunions, left ? ?Posterior tibial tendon dysfunction, left ? ?Acquired  pes planus, left ? ?REFERRING DIAG: Hallux valgus with bunions, left [M20.12, M21.612], Posterior tibial tendon dysfunction, left [M76.822], Acquired pes planus, left [M21.42] ? ?SUBJECTIVE: ? ?PERTINENT PAST HISTORY:  ?Osteoporosis ?     ?PRECAUTIONS: TMT joint only partially fused ? ?WEIGHT BEARING RESTRICTIONS Yes currently in CAM boot for w/b ? ?FALLS:  ?Has patient fallen in last 6 months? No, Number of falls: 0 ? ?LIVING ENVIRONMENT: ?Lives with: lives alone ?Stairs: Yes; External: 4 steps; can reach both ? ?MOI/History of condition: ? ?Onset date: 06/29/21 ? ?Genelda Kathlyne Loud is a 71 y.o. female who presents to clinic with chief complaint of L foot and ankle pain following below surgery on 06/29/21.  She feels thing are going well since surgery.  She is currently in a CAM boot.  She states she occasionally walks at home w/o CAM boot, but was recently told by MD that she should hold off on this for now. ? ?From referring provider:  ? ?"Indications for Procedure:  ?This is a 71 y.o. female with a a severe collapsing pes planovalgus deformity with stage IV posterior tibial tendon dysfunction.  Degenerative changes began to form at the talonavicular and subtalar joints.  After failing multiple nonsurgical treatments including anti-inflammatories bracing and physical therapy she elected for operative intervention.  All risk benefits and potential  complications were discussed prior to surgery.  Informed consent was signed and reviewed prior to surgery ? ?Procedures: ?            1) arthrodesis subtalar joint left ?            2) arthrodesis talonavicular joint left ?            3) correction hallux valgus with Lapidus bunionectomy left" ? ? Red flags:  ?denies  ? ?Pain:  ?Are you having pain? Yes ?Pain location: L foot, diffuse ?NPRS scale:  ?current 2/10  ?average 3/10  ?Aggravating factors: standing, movement ? NPRS, highest: 4/10 ?Relieving factors: rest, ICE ? NPRS: best: 2/10 ?Pain description: intermittent,  sharp, and aching ?Stage: Subacute ?Stability: getting better ?24 hour pattern: no clear pattern  ? ?Occupation: receptionist (sitting) ? ?Assistive Device: SPC ? ?Hand Dominance: NA ? ?Patient Goals/Specific Activities: get foot back to normal, tennis (light) ? ? ?PLOF: Independent ? ?DIAGNOSTIC FINDINGS: Multiple view plain film radiographs: New films today show good consolidation across subtalar and talonavicular joints, Lapidus arthrodesis is taking longer still some lucency here ? ? ?OBJECTIVE:  ? ? GENERAL OBSERVATION/GAIT: ?  Slow gait with L CAM BOOT ? ?SENSATION: ? Light touch: Appears intact ? ?PALPATION: ?TTP dorsum on L foot, over incisions ? ?LE MMT: ? ?MMT Right ?09/12/2021 Left ?09/12/2021  ?Hip flexion (L2, L3) 4 4  ?Knee extension (L3) 4 3+  ?Knee flexion 4 3+  ?Hip abduction 3+ 3  ?Hip extension    ?Hip external rotation    ?Hip internal rotation    ?Hip adduction    ?Ankle dorsiflexion (L4)    ?Ankle plantarflexion (S1)    ?Ankle inversion    ?Ankle eversion    ?Great Toe ext (L5)    ?Grossly    ? ?(Blank rows = not tested, score listed is out of 5 possible points.  N = WNL, D = diminished, C = clear for gross weakness with myotome testing, * = concordant pain with testing) ? ?LE ROM: ? ?ROM Right ?09/12/2021 Left ?09/12/2021  ?Hip flexion    ?Hip extension    ?Hip abduction    ?Hip adduction    ?Hip internal rotation    ?Hip external rotation    ?Knee flexion    ?Knee extension    ?Ankle dorsiflexion 25 Lacking 5  ?Ankle plantarflexion 50 45  ?Ankle inversion 35 15  ?Ankle eversion 25 5  ? ?(Blank rows = not tested, N = WNL, * = concordant pain with testing) ? ?PATIENT SURVEYS:  ?FOTO 74 -> 60 ? ? ?TODAY'S TREATMENT: ?Creating, reviewing, and completing below HEP ? ? ?PATIENT EDUCATION:  ?POC, diagnosis, prognosis, HEP, and outcome measures.  Pt educated via explanation, demonstration, and handout (HEP).  Pt confirms understanding verbally.  ? ?ASTERISK SIGNS ? ? ?Asterisk Signs Eval (09/12/2021)        ?DF/ankle ROM Lacking 5 DF       ?LE MMT <=3+ hip abd, knee       ?        ?        ?        ? ? ?HOME EXERCISE PROGRAM: ?Access Code: [email or web address] ?URL: [email or web address] ?Date: 09/12/2021 ?Prepared by: Shearon Balo ? ?Exercises ?- Seated Ankle Alphabet  - 3 x daily - 7 x weekly - 3 sets - 10 reps ?- Towel Scrunches  - 3 x daily - 7 x weekly - 3 sets - 10 reps ?- Ankle  Inversion Eversion Towel Slide  - 3 x daily - 7 x weekly - 3 sets - 10 reps ? ?ASSESSMENT: ? ?CLINICAL IMPRESSION: ?[place] is a 71 y.o. female who presents to clinic with signs and sxs consistent with L ankle pain and stiffness following L arthrodesis subtalar joint, arthrodesis talonavicular joint, and correction hallux valgus with Lapidus bunionectomy on 06/29/2021.  She does have some signs of slow fusion on TMT joint.  ? ?OBJECTIVE IMPAIRMENTS: Pain, ankle and GT ROM, gait, balance, LE strenth ? ?ACTIVITY LIMITATIONS: walking, standing, housework ? ?PERSONAL FACTORS: See medical history and pertinent history ? ? ?REHAB POTENTIAL: Good ? ?CLINICAL DECISION MAKING: Stable/uncomplicated ? ?EVALUATION COMPLEXITY: Low ? ? ?GOALS: ? ? ?SHORT TERM GOALS: ? ?Eilyn will be >75% HEP compliant to improve carryover between sessions and facilitate independent management of condition ? ?Evaluation (09/12/2021): ongoing ?Target date: 10/03/2021 ?Goal status: INITIAL ? ? ?LONG TERM GOALS: ? ?Sherena will improve FOTO score to 43 as a proxy for functional improvement ? ?Evaluation/Baseline (09/12/2021): 60 ?Target date: 11/07/2021 ?Goal status: INITIAL ? ? ?2.  Jairy will improve left ankle DF to 10 degrees  ? ?Evaluation/Baseline (09/12/2021): lacking 5 degrees degrees ?Target date: 11/07/2021 ?Goal status: INITIAL ? ? ?3.  Lacinda will be able to stand for >30'' in tandem stance, to show a significant improvement in balance in order to reduce fall risk  ? ?Evaluation/Baseline (09/12/2021): unable ?Target date: 11/07/2021 ?Goal status: INITIAL ? ? ?4.  Alysia will  self report >/= 50% decrease in pain from evaluation  ? ?Evaluation/Baseline (09/12/2021): 5/10 max pain ?Target date: 11/07/2021 ?Goal status: INITIAL ? ? ?5.  Alexa will improve the following MMTs to

## 2021-09-18 NOTE — Therapy (Signed)
?OUTPATIENT PHYSICAL THERAPY TREATMENT NOTE ? ? ?Patient Name: Stacy Decker ?MRN: [id number] ?DOB:10-31-1950, 71 y.o., female ?Today's Date: 09/18/2021 ? ?PCP: Irene Pap, PA-C ?REFERRING PROVIDER: Irene Pap, PA-C ? ?END OF SESSION:  ? ? ?Past Medical History:  ?Diagnosis Date  ? Anxiety   ? Depression   ? History of vertigo   ? Osteoporosis   ? Substance abuse ([place])   ? recovering alcoholic-sober since 7564  ? ?Past Surgical History:  ?Procedure Laterality Date  ? AUGMENTATION MAMMAPLASTY Bilateral   ? BUNIONECTOMY Left 06/29/2021  ? Procedure: BUNIONECTOMY;  Surgeon: Criselda Peaches, DPM;  Location: [facility] ORS;  Service: Podiatry;  Laterality: Left;  POPLITEAL AND SAPHENOUS BLOCK  ? carpel tunnel Bilateral   ? COLONOSCOPY    ? FLAT FOOT CORRECTION Left 06/29/2021  ? Procedure: FLAT FOOT CORRECTION;  Surgeon: Criselda Peaches, DPM;  Location: [facility] ORS;  Service: Podiatry;  Laterality: Left;  ? PLACEMENT OF BREAST IMPLANTS    ? ?Patient Active Problem List  ? Diagnosis Date Noted  ? Pes planus   ? Arthritis of left subtalar joint   ? Acquired hallux valgus of left foot   ? Status post left foot surgery 06/29/2021  ? History of tobacco abuse 06/29/2021  ? Recovering alcoholic ([place]) 33/29/5188  ? Spider veins 02/09/2020  ? Obesity (BMI 30-39.9) 07/31/2018  ? Estrogen deficiency 07/31/2018  ? Prediabetes 05/29/2017  ? Anxiety and depression 05/29/2017  ? Screening for cervical cancer 05/29/2017  ? DVT prophylaxis 05/29/2017  ? Osteoporosis without current pathological fracture 03/12/2016  ? ? ?REFERRING DIAG: Criselda Peaches, DPM ? ?THERAPY DIAG:  ?No diagnosis found. ? ?PERTINENT HISTORY: Osteoporosis ? ?PRECAUTIONS: TMT joint only partially fused ? ?WEIGHT BEARING RESTRICTIONS Yes currently in CAM boot for w/b ? ?SUBJECTIVE: I'm not in any pain. ? ?PAIN:  ?Are you having pain? No ?Pain location: L foot, diffuse ?NPRS scale:  ?current 0/10  ?Pain description: intermittent, sharp, and aching ?Stage:  Subacute ?Stability: getting better ?24 hour pattern: no clear pattern  ? ? ?OBJECTIVE: (objective measures completed at initial evaluation unless otherwise dated) ? ? ?OBJECTIVE:  ?  ?          GENERAL OBSERVATION/GAIT: ?                    Slow gait with L CAM BOOT ?  ?SENSATION: ?         Light touch: Appears intact ?  ?PALPATION: ?TTP dorsum on L foot, over incisions ?  ?LE MMT: ?  ?MMT Right ?09/12/2021 Left ?09/12/2021  ?Hip flexion (L2, L3) 4 4  ?Knee extension (L3) 4 3+  ?Knee flexion 4 3+  ?Hip abduction 3+ 3  ?Hip extension      ?Hip external rotation      ?Hip internal rotation      ?Hip adduction      ?Ankle dorsiflexion (L4)      ?Ankle plantarflexion (S1)      ?Ankle inversion      ?Ankle eversion      ?Great Toe ext (L5)      ?Grossly      ?  ?(Blank rows = not tested, score listed is out of 5 possible points.  N = WNL, D = diminished, C = clear for gross weakness with myotome testing, * = concordant pain with testing) ?  ?LE ROM: ?  ?ROM Right ?09/12/2021 Left ?09/12/2021  ?Hip flexion      ?  Hip extension      ?Hip abduction      ?Hip adduction      ?Hip internal rotation      ?Hip external rotation      ?Knee flexion      ?Knee extension      ?Ankle dorsiflexion 25 Lacking 5  ?Ankle plantarflexion 50 45  ?Ankle inversion 35 15  ?Ankle eversion 25 5  ?  ?(Blank rows = not tested, N = WNL, * = concordant pain with testing) ?  ?PATIENT SURVEYS:  ?FOTO 68 -> 60 ?  ?  ?TODAY'S TREATMENT: ?[facility] Adult PT Treatment:                                                DATE: 09/19/2021 ?Therapeutic Exercise: ?Ankle ABC's x2 ?Towel scrunches 2x1' ?Towel inv/ev slide x1' each ?Seated calf stretch 2x30" Lt ?Seated clamshells GTB 2x10 ?Seated marching 2x10 BIL ?Standing hip abduction 2x10 BIL ?LAQ 2x10 BIL ?Seated hamstring curls GTB 2x10 BIL ?Neuromuscular re-ed: ?Romberg stance EO/EC 30" each ?Tandem stance x30" BIL ? ? ?09/12/2021: Creating, reviewing, and completing below HEP ?  ?  ?PATIENT EDUCATION:  ?POC, diagnosis,  prognosis, HEP, and outcome measures.  Pt educated via explanation, demonstration, and handout (HEP).  Pt confirms understanding verbally.  ?  ?ASTERISK SIGNS ?  ?  ?Asterisk Signs Eval (09/12/2021)            ?DF/ankle ROM Lacking 5 DF            ?LE MMT <=3+ hip abd, knee            ?               ?               ?               ?  ?  ?HOME EXERCISE PROGRAM: ?Access Code: [email or web address] ?URL: [email or web address] ?Date: 09/12/2021 ?Prepared by: Shearon Balo ?  ?Exercises ?- Seated Ankle Alphabet  - 3 x daily - 7 x weekly - 3 sets - 10 reps ?- Towel Scrunches  - 3 x daily - 7 x weekly - 3 sets - 10 reps ?- Ankle Inversion Eversion Towel Slide  - 3 x daily - 7 x weekly - 3 sets - 10 reps ?  ?ASSESSMENT: ?  ?CLINICAL IMPRESSION: ?Patient presents to PT with no current pain and reports HEP non-compliance. Session today focused on LE and ankle strengthening as well as balance training. Reviewed HEP and patient demonstrated understanding of each exercise and importance of completing exercises at home. Patient was able to tolerate all prescribed exercises with no adverse effects. Patient continues to benefit from skilled PT services and should be progressed as able to improve functional independence. ? ?  ?OBJECTIVE IMPAIRMENTS: Pain, ankle and GT ROM, gait, balance, LE strenth ?  ?ACTIVITY LIMITATIONS: walking, standing, housework ?  ?PERSONAL FACTORS: See medical history and pertinent history ?  ?  ?REHAB POTENTIAL: Good ?  ?CLINICAL DECISION MAKING: Stable/uncomplicated ?  ?EVALUATION COMPLEXITY: Low ?  ?  ?GOALS: ?  ?  ?SHORT TERM GOALS: ?  ?Eldonna will be >75% HEP compliant to improve carryover between sessions and facilitate independent management of condition ?  ?Evaluation (09/12/2021): ongoing ?Target date: 10/03/2021 ?Goal status: INITIAL ?  ?  ?LONG  TERM GOALS: ?  ?Dominiqua will improve FOTO score to 43 as a proxy for functional improvement ?  ?Evaluation/Baseline (09/12/2021): 60 ?Target date:  11/07/2021 ?Goal status: INITIAL ?  ?  ?2.  Syd will improve left ankle DF to 10 degrees  ?  ?Evaluation/Baseline (09/12/2021): lacking 5 degrees degrees ?Target date: 11/07/2021 ?Goal status: INITIAL ?  ?  ?3.  Ketsia will be able to stand for >30'' in tandem stance, to show a significant improvement in balance in order to reduce fall risk  ?  ?Evaluation/Baseline (09/12/2021): unable ?Target date: 11/07/2021 ?Goal status: INITIAL ?  ?  ?4.  Julita will self report >/= 50% decrease in pain from evaluation  ?  ?Evaluation/Baseline (09/12/2021): 5/10 max pain ?Target date: 11/07/2021 ?Goal status: INITIAL ?  ?  ?5.  Philamena will improve the following MMTs to >/= 4/5 to show improvement in strength:  hip abduction, knee ext, knee flexion  ?  ?Evaluation/Baseline (09/12/2021): see chart in note ?Target date: 11/07/2021 ?Goal status: INITIAL ?  ?  ?  ?PLAN: ?PT FREQUENCY: 1-2x/week ?  ?PT DURATION: 8 weeks (Ending 11/07/2021) ?  ?PLANNED INTERVENTIONS: Therapeutic exercises, Aquatic therapy, Therapeutic activity, Neuro Muscular re-education, Gait training, Patient/Family education, Joint mobilization, Dry Needling, Electrical stimulation, Spinal mobilization and/or manipulation, Moist heat, Taping, Vasopneumatic device, Ionotophoresis '4mg'$ /ml Dexamethasone, and Manual therapy ?  ?PLAN FOR NEXT SESSION: progressive balance, strength, gait ? ? ? ?Evelene Croon, PTA ?09/18/2021, 2:05 PM ? ?  ? ?

## 2021-09-19 ENCOUNTER — Ambulatory Visit: Payer: PPO

## 2021-09-19 DIAGNOSIS — M2012 Hallux valgus (acquired), left foot: Secondary | ICD-10-CM

## 2021-09-19 DIAGNOSIS — M2142 Flat foot [pes planus] (acquired), left foot: Secondary | ICD-10-CM

## 2021-09-19 DIAGNOSIS — M76822 Posterior tibial tendinitis, left leg: Secondary | ICD-10-CM

## 2021-09-22 ENCOUNTER — Ambulatory Visit: Payer: PPO | Admitting: Physical Therapy

## 2021-09-22 ENCOUNTER — Encounter: Payer: Self-pay | Admitting: Physical Therapy

## 2021-09-22 DIAGNOSIS — M2142 Flat foot [pes planus] (acquired), left foot: Secondary | ICD-10-CM

## 2021-09-22 DIAGNOSIS — M2012 Hallux valgus (acquired), left foot: Secondary | ICD-10-CM | POA: Diagnosis not present

## 2021-09-22 DIAGNOSIS — R262 Difficulty in walking, not elsewhere classified: Secondary | ICD-10-CM

## 2021-09-22 DIAGNOSIS — M76822 Posterior tibial tendinitis, left leg: Secondary | ICD-10-CM

## 2021-09-22 NOTE — Therapy (Signed)
?OUTPATIENT PHYSICAL THERAPY TREATMENT NOTE ? ? ?Patient Name: Stacy Decker ?MRN: [id number] ?DOB:09/11/1950, 71 y.o., female ?Today's Date: 09/22/2021 ? ?PCP: Irene Pap, PA-C ?REFERRING PROVIDER: Irene Pap, PA-C ? ?END OF SESSION:  ? PT End of Session - 09/22/21 0900   ? ? Visit Number 3   ? Number of Visits --   1-2x/week  ? Date for PT Re-Evaluation 11/07/21   ? Authorization Type [employer] - FOTO - MCR   ? Progress Note Due on Visit 10   ? PT Start Time 0900   ? PT Stop Time 0940   ? PT Time Calculation (min) 40 min   ? Activity Tolerance Patient tolerated treatment well   ? Behavior During Therapy [facility] for tasks assessed/performed   ? ?  ?  ? ?  ? ? ?Past Medical History:  ?Diagnosis Date  ? Anxiety   ? Depression   ? History of vertigo   ? Osteoporosis   ? Substance abuse ([place])   ? recovering alcoholic-sober since 989  ? ?Past Surgical History:  ?Procedure Laterality Date  ? AUGMENTATION MAMMAPLASTY Bilateral   ? BUNIONECTOMY Left 06/29/2021  ? Procedure: BUNIONECTOMY;  Surgeon: Criselda Peaches, DPM;  Location: [facility] OR[facility];  Service: Podiatry;  Laterality: Left;  POPLITEAL AND SAPHENOUS BLOCK  ? carpel tunnel Bilateral   ? COLONOSCOPY    ? FLAT FOOT CORRECTION Left 06/29/2021  ? Procedure: FLAT FOOT CORRECTION;  Surgeon: Criselda Peaches, DPM;  Location: [facility] ORS;  Service: Podiatry;  Laterality: Left;  ? PLACEMENT OF BREAST IMPLANTS    ? ?Patient Active Problem List  ? Diagnosis Date Noted  ? Pes planus   ? Arthritis of left subtalar joint   ? Acquired hallux valgus of left foot   ? Status post left foot surgery 06/29/2021  ? History of tobacco abuse 06/29/2021  ? Recovering alcoholic ([place]) 13/12/6576  ? Spider veins 02/09/2020  ? Obesity (BMI 30-39.9) 07/31/2018  ? Estrogen deficiency 07/31/2018  ? Prediabetes 05/29/2017  ? Anxiety and depression 05/29/2017  ? Screening for cervical cancer 05/29/2017  ? DVT prophylaxis 05/29/2017  ? Osteoporosis without current pathological fracture  03/12/2016  ? ? ?REFERRING DIAG: Criselda Peaches, DPM ? ?THERAPY DIAG:  ?Difficulty in walking, not elsewhere classified ? ?Posterior tibial tendon dysfunction, left ? ?Acquired pes planus, left ? ?PERTINENT HISTORY: Osteoporosis ? ?PRECAUTIONS: TMT joint only partially fused ? ?WEIGHT BEARING RESTRICTIONS Yes currently in CAM boot for w/b ? ?SUBJECTIVE: Pt reports she is doing well.  Not HEP compliant. ? ?PAIN:  ?Are you having pain? No ?Pain location: L foot, diffuse ?NPRS scale:  ?current 0/10  ?Pain description: intermittent, sharp, and aching ?Stage: Subacute ?Stability: getting better ?24 hour pattern: no clear pattern  ? ? ?OBJECTIVE: (objective measures completed at initial evaluation unless otherwise dated) ? ? ?OBJECTIVE:  ?  ?          GENERAL OBSERVATION/GAIT: ?                    Slow gait with L CAM BOOT ?  ?SENSATION: ?         Light touch: Appears intact ?  ?PALPATION: ?TTP dorsum on L foot, over incisions ?  ?LE MMT: ?  ?MMT Right ?09/12/2021 Left ?09/12/2021  ?Hip flexion (L2, L3) 4 4  ?Knee extension (L3) 4 3+  ?Knee flexion 4 3+  ?Hip abduction 3+ 3  ?Hip extension      ?Hip  external rotation      ?Hip internal rotation      ?Hip adduction      ?Ankle dorsiflexion (L4)      ?Ankle plantarflexion (S1)      ?Ankle inversion      ?Ankle eversion      ?Great Toe ext (L5)      ?Grossly      ?  ?(Blank rows = not tested, score listed is out of 5 possible points.  N = WNL, D = diminished, C = clear for gross weakness with myotome testing, * = concordant pain with testing) ?  ?LE ROM: ?  ?ROM Right ?09/12/2021 Left ?09/12/2021  ?Hip flexion      ?Hip extension      ?Hip abduction      ?Hip adduction      ?Hip internal rotation      ?Hip external rotation      ?Knee flexion      ?Knee extension      ?Ankle dorsiflexion 25 Lacking 5  ?Ankle plantarflexion 50 45  ?Ankle inversion 35 15  ?Ankle eversion 25 5  ?  ?(Blank rows = not tested, N = WNL, * = concordant pain with testing) ?  ?PATIENT SURVEYS:  ?FOTO 74  -> 60 ?  ?  ?TODAY'S TREATMENT: ? ?[facility] Adult PT Treatment:                                                DATE: 09/22/2021 ?Therapeutic Exercise: ?Bike no resistance - 4 ' ?Towel scrunches 2x1' ?SLR - 3x10 - 2# ?S/L hip abduction - 3x10 ?Prone SLR - 3x10 ?BAPS board L3 - fwd and lateral ?Knee ext machine 2x10 at 10# ?Knee flexion machine 2x10 at 20# ? ?Manual Therapy ?Gentle PROM for GT ext ? ?[facility] Adult PT Treatment:                                                DATE: 09/19/2021 ?Therapeutic Exercise: ?Ankle ABC's x2 ?Towel scrunches 2x1' ?Towel inv/ev slide x1' each ?Seated calf stretch 2x30" Lt ?Seated clamshells GTB 2x10 ?Seated marching 2x10 BIL ?Standing hip abduction 2x10 BIL ?LAQ 2x10 BIL ?Seated hamstring curls GTB 2x10 BIL ?Neuromuscular re-ed: ?Romberg stance EO/EC 30" each ?Tandem stance x30" BIL ? ? ?09/12/2021: Creating, reviewing, and completing below HEP ?  ?  ?PATIENT EDUCATION:  ?POC, diagnosis, prognosis, HEP, and outcome measures.  Pt educated via explanation, demonstration, and handout (HEP).  Pt confirms understanding verbally.  ?  ?ASTERISK SIGNS ?  ?  ?Asterisk Signs Eval (09/12/2021)            ?DF/ankle ROM Lacking 5 DF            ?LE MMT <=3+ hip abd, knee            ?               ?               ?               ?  ?  ?HOME EXERCISE PROGRAM: ?Access Code: [email or web address] ?URL: [email or web address] ?Date: 09/12/2021 ?Prepared by: Shearon Balo ?  ?Exercises ?- Seated  Ankle Alphabet  - 3 x daily - 7 x weekly - 3 sets - 10 reps ?- Towel Scrunches  - 3 x daily - 7 x weekly - 3 sets - 10 reps ?- Ankle Inversion Eversion Towel Slide  - 3 x daily - 7 x weekly - 3 sets - 10 reps ?  ?ASSESSMENT: ?  ?CLINICAL IMPRESSION: ?Stacy Decker did well with therapy.  We concentrated on general LE and hip strengthening to good effect.  She shows poor control with BAPS board but no increase in pain; we will continue to work on this.  She will see MD on Tues and hopefully she will be released form CAM  boot. ? ?  ?OBJECTIVE IMPAIRMENTS: Pain, ankle and GT ROM, gait, balance, LE strenth ?  ?ACTIVITY LIMITATIONS: walking, standing, housework ?  ?PERSONAL FACTORS: See medical history and pertinent history ?  ?  ?REHAB POTENTIAL: Good ?  ?CLINICAL DECISION MAKING: Stable/uncomplicated ?  ?EVALUATION COMPLEXITY: Low ?  ?  ?GOALS: ?  ?  ?SHORT TERM GOALS: ?  ?Stacy Decker will be >75% HEP compliant to improve carryover between sessions and facilitate independent management of condition ?  ?Evaluation (09/12/2021): ongoing ?Target date: 10/03/2021 ?Goal status: INITIAL ?  ?  ?LONG TERM GOALS: ?  ?Stacy Decker will improve FOTO score to 43 as a proxy for functional improvement ?  ?Evaluation/Baseline (09/12/2021): 60 ?Target date: 11/07/2021 ?Goal status: INITIAL ?  ?  ?2.  Stacy Decker will improve left ankle DF to 10 degrees  ?  ?Evaluation/Baseline (09/12/2021): lacking 5 degrees degrees ?Target date: 11/07/2021 ?Goal status: INITIAL ?  ?  ?3.  Stacy Decker will be able to stand for >30'' in tandem stance, to show a significant improvement in balance in order to reduce fall risk  ?  ?Evaluation/Baseline (09/12/2021): unable ?Target date: 11/07/2021 ?Goal status: INITIAL ?  ?  ?4.  Stacy Decker will self report >/= 50% decrease in pain from evaluation  ?  ?Evaluation/Baseline (09/12/2021): 5/10 max pain ?Target date: 11/07/2021 ?Goal status: INITIAL ?  ?  ?5.  Stacy Decker will improve the following MMTs to >/= 4/5 to show improvement in strength:  hip abduction, knee ext, knee flexion  ?  ?Evaluation/Baseline (09/12/2021): see chart in note ?Target date: 11/07/2021 ?Goal status: INITIAL ?  ?  ?  ?PLAN: ?PT FREQUENCY: 1-2x/week ?  ?PT DURATION: 8 weeks (Ending 11/07/2021) ?  ?PLANNED INTERVENTIONS: Therapeutic exercises, Aquatic therapy, Therapeutic activity, Neuro Muscular re-education, Gait training, Patient/Family education, Joint mobilization, Dry Needling, Electrical stimulation, Spinal mobilization and/or manipulation, Moist heat, Taping, Vasopneumatic device,  Ionotophoresis '4mg'$ /ml Dexamethasone, and Manual therapy ?  ?PLAN FOR NEXT SESSION: progressive balance, strength, gait ? ? ? ?Mathis Dad, PT ?09/22/2021, 9:43 AM ? ?  ? ?

## 2021-09-25 ENCOUNTER — Encounter: Payer: Self-pay | Admitting: Podiatry

## 2021-09-25 ENCOUNTER — Ambulatory Visit (INDEPENDENT_AMBULATORY_CARE_PROVIDER_SITE_OTHER): Payer: PPO

## 2021-09-25 ENCOUNTER — Ambulatory Visit (INDEPENDENT_AMBULATORY_CARE_PROVIDER_SITE_OTHER): Payer: PPO | Admitting: Podiatry

## 2021-09-25 DIAGNOSIS — M21612 Bunion of left foot: Secondary | ICD-10-CM | POA: Diagnosis not present

## 2021-09-25 DIAGNOSIS — M76822 Posterior tibial tendinitis, left leg: Secondary | ICD-10-CM

## 2021-09-25 DIAGNOSIS — M2012 Hallux valgus (acquired), left foot: Secondary | ICD-10-CM

## 2021-09-25 DIAGNOSIS — M2142 Flat foot [pes planus] (acquired), left foot: Secondary | ICD-10-CM

## 2021-09-25 DIAGNOSIS — M25572 Pain in left ankle and joints of left foot: Secondary | ICD-10-CM

## 2021-09-26 ENCOUNTER — Ambulatory Visit: Payer: PPO

## 2021-09-29 ENCOUNTER — Ambulatory Visit: Payer: PPO | Attending: Podiatry

## 2021-09-29 DIAGNOSIS — M2012 Hallux valgus (acquired), left foot: Secondary | ICD-10-CM | POA: Insufficient documentation

## 2021-09-29 DIAGNOSIS — R293 Abnormal posture: Secondary | ICD-10-CM | POA: Insufficient documentation

## 2021-09-29 DIAGNOSIS — M21612 Bunion of left foot: Secondary | ICD-10-CM | POA: Diagnosis not present

## 2021-09-29 DIAGNOSIS — R262 Difficulty in walking, not elsewhere classified: Secondary | ICD-10-CM | POA: Insufficient documentation

## 2021-09-29 DIAGNOSIS — M76822 Posterior tibial tendinitis, left leg: Secondary | ICD-10-CM | POA: Insufficient documentation

## 2021-09-29 DIAGNOSIS — M2142 Flat foot [pes planus] (acquired), left foot: Secondary | ICD-10-CM | POA: Insufficient documentation

## 2021-09-29 NOTE — Therapy (Signed)
?OUTPATIENT PHYSICAL THERAPY TREATMENT NOTE ? ? ?Patient Name: Stacy Decker ?MRN: [id number] ?DOB:1950-08-09, 71 y.o., female ?Today's Date: 09/29/2021 ? ?PCP: Irene Pap, PA-C ?REFERRING PROVIDER: Irene Pap, PA-C ? ?END OF SESSION:  ? PT End of Session - 09/29/21 1035   ? ? Visit Number 4   ? Date for PT Re-Evaluation 11/07/21   ? Authorization Type [employer] - FOTO - MCR   ? PT Start Time 1035   ? PT Stop Time 1115   ? PT Time Calculation (min) 40 min   ? Activity Tolerance Patient tolerated treatment well   ? Behavior During Therapy [facility] for tasks assessed/performed   ? ?  ?  ? ?  ? ? ? ?Past Medical History:  ?Diagnosis Date  ? Anxiety   ? Depression   ? History of vertigo   ? Osteoporosis   ? Substance abuse ([place])   ? recovering alcoholic-sober since 6720  ? ?Past Surgical History:  ?Procedure Laterality Date  ? AUGMENTATION MAMMAPLASTY Bilateral   ? BUNIONECTOMY Left 06/29/2021  ? Procedure: BUNIONECTOMY;  Surgeon: Criselda Peaches, DPM;  Location: [facility] ORS;  Service: Podiatry;  Laterality: Left;  POPLITEAL AND SAPHENOUS BLOCK  ? carpel tunnel Bilateral   ? COLONOSCOPY    ? FLAT FOOT CORRECTION Left 06/29/2021  ? Procedure: FLAT FOOT CORRECTION;  Surgeon: Criselda Peaches, DPM;  Location: [facility] ORS;  Service: Podiatry;  Laterality: Left;  ? PLACEMENT OF BREAST IMPLANTS    ? ?Patient Active Problem List  ? Diagnosis Date Noted  ? Pes planus   ? Arthritis of left subtalar joint   ? Acquired hallux valgus of left foot   ? Status post left foot surgery 06/29/2021  ? History of tobacco abuse 06/29/2021  ? Recovering alcoholic ([place]) 94/70/9628  ? Spider veins 02/09/2020  ? Obesity (BMI 30-39.9) 07/31/2018  ? Estrogen deficiency 07/31/2018  ? Prediabetes 05/29/2017  ? Anxiety and depression 05/29/2017  ? Screening for cervical cancer 05/29/2017  ? DVT prophylaxis 05/29/2017  ? Osteoporosis without current pathological fracture 03/12/2016  ? ? ?REFERRING DIAG: Criselda Peaches, DPM ? ?THERAPY  DIAG:  ?Difficulty in walking, not elsewhere classified ? ?Posterior tibial tendon dysfunction, left ? ?Acquired pes planus, left ? ?PERTINENT HISTORY: Osteoporosis ? ?PRECAUTIONS: TMT joint only partially fused ? ?WEIGHT BEARING RESTRICTIONS Yes currently in CAM boot for w/b ? ?SUBJECTIVE: Patient reports HEP non-compliance. She ambulates into clinic on longer in cam boot but in ankle brace on LLE.  ? ?PAIN:  ?Are you having pain? No ?Pain location: L foot, diffuse ?NPRS scale:  ?current 2/10  ?Pain description: intermittent, sharp, and aching ?Stage: Subacute ?Stability: getting better ?24 hour pattern: no clear pattern  ? ? ?OBJECTIVE: (objective measures completed at initial evaluation unless otherwise dated) ? ? ?OBJECTIVE:  ?  ?          GENERAL OBSERVATION/GAIT: ?                    Slow gait with L CAM BOOT ?  ?SENSATION: ?         Light touch: Appears intact ?  ?PALPATION: ?TTP dorsum on L foot, over incisions ?  ?LE MMT: ?  ?MMT Right ?09/12/2021 Left ?09/12/2021  ?Hip flexion (L2, L3) 4 4  ?Knee extension (L3) 4 3+  ?Knee flexion 4 3+  ?Hip abduction 3+ 3  ?Hip extension      ?Hip external rotation      ?  Hip internal rotation      ?Hip adduction      ?Ankle dorsiflexion (L4)      ?Ankle plantarflexion (S1)      ?Ankle inversion      ?Ankle eversion      ?Great Toe ext (L5)      ?Grossly      ?  ?(Blank rows = not tested, score listed is out of 5 possible points.  N = WNL, D = diminished, C = clear for gross weakness with myotome testing, * = concordant pain with testing) ?  ?LE ROM: ?  ?ROM Right ?09/12/2021 Left ?09/12/2021  ?Hip flexion      ?Hip extension      ?Hip abduction      ?Hip adduction      ?Hip internal rotation      ?Hip external rotation      ?Knee flexion      ?Knee extension      ?Ankle dorsiflexion 25 Lacking 5  ?Ankle plantarflexion 50 45  ?Ankle inversion 35 15  ?Ankle eversion 25 5  ?  ?(Blank rows = not tested, N = WNL, * = concordant pain with testing) ?  ?PATIENT SURVEYS:  ?FOTO 13 ->  60 ?  ?  ?TODAY'S TREATMENT: ?[facility] Adult PT Treatment:                                                DATE: 09/29/2021 ?Therapeutic Exercise: ?Bike no resistance x 5 mins ?SLR - 3x10 - 2# ?S/L hip abduction - 3x10 ?BAPS board L3 - PF/DF, circles CW/CCW (out of brace and shoe) ?Knee ext machine 2x10 at 10# ?Knee flexion machine 2x10 at 20# ?Omega leg press 2x10 25# BIL ?Toe yoga x1' ?Neuromuscular re-ed: ?Romberg stance EO/EC 30" each ?Tandem stance x30" BIL ?Romberg stance on foam x30" EO ? ? ?[facility] Adult PT Treatment:                                                DATE: 09/22/2021 ?Therapeutic Exercise: ?Bike no resistance - 4 ' ?Towel scrunches 2x1' ?SLR - 3x10 - 2# ?S/L hip abduction - 3x10 ?Prone SLR - 3x10 ?BAPS board L3 - fwd and lateral ?Knee ext machine 2x10 at 10# ?Knee flexion machine 2x10 at 20# ? ?Manual Therapy ?Gentle PROM for GT ext ? ?[facility] Adult PT Treatment:                                                DATE: 09/19/2021 ?Therapeutic Exercise: ?Ankle ABC's x2 ?Towel scrunches 2x1' ?Towel inv/ev slide x1' each ?Seated calf stretch 2x30" Lt ?Seated clamshells GTB 2x10 ?Seated marching 2x10 BIL ?Standing hip abduction 2x10 BIL ?LAQ 2x10 BIL ?Seated hamstring curls GTB 2x10 BIL ?Neuromuscular re-ed: ?Romberg stance EO/EC 30" each ?Tandem stance x30" BIL ? ?  ?  ?PATIENT EDUCATION:  ?POC, diagnosis, prognosis, HEP, and outcome measures.  Pt educated via explanation, demonstration, and handout (HEP).  Pt confirms understanding verbally.  ?  ?ASTERISK SIGNS ?  ?  ?Asterisk Signs Eval (09/12/2021)            ?  DF/ankle ROM Lacking 5 DF            ?LE MMT <=3+ hip abd, knee            ?               ?               ?               ?  ?  ?HOME EXERCISE PROGRAM: ?Access Code: [email or web address] ?URL: [email or web address] ?Date: 09/12/2021 ?Prepared by: Shearon Balo ?  ?Exercises ?- Seated Ankle Alphabet  - 3 x daily - 7 x weekly - 3 sets - 10 reps ?- Towel Scrunches  - 3 x daily - 7 x weekly - 3 sets - 10  reps ?- Ankle Inversion Eversion Towel Slide  - 3 x daily - 7 x weekly - 3 sets - 10 reps ?  ?ASSESSMENT: ?  ?CLINICAL IMPRESSION: ?Patient presents to PT ambulating without cam boot and in ankle brace and she reports she still has not been compliance with her HEP. Session today focused on LE and hip strengthening as well as ankle control with BAPs. Patient was able to tolerate all prescribed exercises with no adverse effects. She displays the most difficulty with BAPs and toe yoga. Patient continues to benefit from skilled PT services and should be progressed as able to improve functional independence. ? ?  ?OBJECTIVE IMPAIRMENTS: Pain, ankle and GT ROM, gait, balance, LE strenth ?  ?ACTIVITY LIMITATIONS: walking, standing, housework ?  ?PERSONAL FACTORS: See medical history and pertinent history ?  ?  ?REHAB POTENTIAL: Good ?  ?CLINICAL DECISION MAKING: Stable/uncomplicated ?  ?EVALUATION COMPLEXITY: Low ?  ?  ?GOALS: ?  ?  ?SHORT TERM GOALS: ?  ?Denesha will be >75% HEP compliant to improve carryover between sessions and facilitate independent management of condition ?  ?Evaluation (09/12/2021): ongoing ?Target date: 10/03/2021 ?Goal status: INITIAL ?  ?  ?LONG TERM GOALS: ?  ?Lylee will improve FOTO score to 43 as a proxy for functional improvement ?  ?Evaluation/Baseline (09/12/2021): 60 ?Target date: 11/07/2021 ?Goal status: INITIAL ?  ?  ?2.  Kinsley will improve left ankle DF to 10 degrees  ?  ?Evaluation/Baseline (09/12/2021): lacking 5 degrees degrees ?Target date: 11/07/2021 ?Goal status: INITIAL ?  ?  ?3.  Jessicah will be able to stand for >30'' in tandem stance, to show a significant improvement in balance in order to reduce fall risk  ?  ?Evaluation/Baseline (09/12/2021): unable ?Target date: 11/07/2021 ?Goal status: INITIAL ?  ?  ?4.  Rolene will self report >/= 50% decrease in pain from evaluation  ?  ?Evaluation/Baseline (09/12/2021): 5/10 max pain ?Target date: 11/07/2021 ?Goal status: INITIAL ?  ?  ?5.  Merriam will  improve the following MMTs to >/= 4/5 to show improvement in strength:  hip abduction, knee ext, knee flexion  ?  ?Evaluation/Baseline (09/12/2021): see chart in note ?Target date: 11/07/2021 ?Goal status: INI

## 2021-09-30 NOTE — Progress Notes (Signed)
?  Subjective:  ?Patient ID: Stacy Decker, female    DOB: Jul 30, 1950,  MRN: [id number] ? ?Chief Complaint  ?Patient presents with  ? Bunions  ?  POV #4 DOS 06/29/2021 LT FOOT CALF MUSCLE LENGTHENING, REARFOOT FUSION, MIDFOOT FUSION OR OSTEOTOMY, BONE GRAFTING FROM LEG  ? ? ? ?71 y.o. female returns for post-op check.  She has still been wearing the boot the physical therapy is helping ? ?Review of Systems: Negative except as noted in the HPI. Denies N/V/F/Ch. ? ? ?Objective:  ?There were no vitals filed for this visit. ?There is no height or weight on file to calculate BMI. ?Constitutional Well developed. ?Well nourished.  ?Vascular Foot warm and well perfused. ?Capillary refill normal to all digits.  Calf is soft and supple, no posterior calf or knee pain, negative Homans' sign  ?Neurologic Normal speech. ?Oriented to person, place, and time. ?Epicritic sensation to light touch grossly present bilaterally.  ?Dermatologic Incision well-healed and not hypertrophic  ?Orthopedic: Minimal edema and minimal tenderness to palpation noted about the surgical site.  ? ?Multiple view plain film radiographs: New films today show good consolidation across arthrodesis sites ?Assessment:  ? ?1. Hallux valgus with bunions, left   ? ?Plan:  ?Patient was evaluated and treated and all questions answered. ? ?S/p foot surgery left ?-Continues to improve.  I transitioned her from CAM boot today to Tri-Lock ankle brace and supportive shoe gear she should continue physical therapy.  We will see her back in 6 weeks for new x-rays ? ?Return in about 6 weeks (around 11/06/2021) for post op (new x-rays).  ?

## 2021-10-02 ENCOUNTER — Encounter: Payer: Self-pay | Admitting: Podiatry

## 2021-10-03 ENCOUNTER — Encounter: Payer: Self-pay | Admitting: Physical Therapy

## 2021-10-03 ENCOUNTER — Encounter: Payer: Self-pay | Admitting: Physician Assistant

## 2021-10-03 ENCOUNTER — Ambulatory Visit: Payer: PPO | Admitting: Physical Therapy

## 2021-10-03 DIAGNOSIS — M2012 Hallux valgus (acquired), left foot: Secondary | ICD-10-CM

## 2021-10-03 DIAGNOSIS — R262 Difficulty in walking, not elsewhere classified: Secondary | ICD-10-CM | POA: Diagnosis not present

## 2021-10-03 DIAGNOSIS — M76822 Posterior tibial tendinitis, left leg: Secondary | ICD-10-CM

## 2021-10-03 DIAGNOSIS — M2142 Flat foot [pes planus] (acquired), left foot: Secondary | ICD-10-CM

## 2021-10-03 NOTE — Therapy (Signed)
?OUTPATIENT PHYSICAL THERAPY TREATMENT NOTE ? ? ?Patient Name: Stacy Decker ?MRN: [id number] ?DOB:1950-08-02, 71 y.o., female ?Today's Date: 10/03/2021 ? ?PCP: Irene Pap, PA-C ?REFERRING PROVIDER: Criselda Peaches, DPM ? ?END OF SESSION:  ? PT End of Session - 10/03/21 1054   ? ? Visit Number 5   ? Date for PT Re-Evaluation 11/07/21   ? Authorization Type [employer] - FOTO - MCR   ? PT Start Time 1054   pt arrived late  ? PT Stop Time 1125   ? PT Time Calculation (min) 31 min   ? Activity Tolerance Patient tolerated treatment well   ? Behavior During Therapy [facility] for tasks assessed/performed   ? ?  ?  ? ?  ? ? ? ?Past Medical History:  ?Diagnosis Date  ? Anxiety   ? Depression   ? History of vertigo   ? Osteoporosis   ? Substance abuse ([place])   ? recovering alcoholic-sober since 5790  ? ?Past Surgical History:  ?Procedure Laterality Date  ? AUGMENTATION MAMMAPLASTY Bilateral   ? BUNIONECTOMY Left 06/29/2021  ? Procedure: BUNIONECTOMY;  Surgeon: Criselda Peaches, DPM;  Location: [facility] ORS;  Service: Podiatry;  Laterality: Left;  POPLITEAL AND SAPHENOUS BLOCK  ? carpel tunnel Bilateral   ? COLONOSCOPY    ? FLAT FOOT CORRECTION Left 06/29/2021  ? Procedure: FLAT FOOT CORRECTION;  Surgeon: Criselda Peaches, DPM;  Location: [facility] ORS;  Service: Podiatry;  Laterality: Left;  ? PLACEMENT OF BREAST IMPLANTS    ? ?Patient Active Problem List  ? Diagnosis Date Noted  ? Pes planus   ? Arthritis of left subtalar joint   ? Acquired hallux valgus of left foot   ? Status post left foot surgery 06/29/2021  ? History of tobacco abuse 06/29/2021  ? Recovering alcoholic ([place]) 38/33/3832  ? Spider veins 02/09/2020  ? Obesity (BMI 30-39.9) 07/31/2018  ? Estrogen deficiency 07/31/2018  ? Prediabetes 05/29/2017  ? Anxiety and depression 05/29/2017  ? Screening for cervical cancer 05/29/2017  ? DVT prophylaxis 05/29/2017  ? Osteoporosis without current pathological fracture 03/12/2016  ? ? ?REFERRING DIAG: Criselda Peaches,  DPM ? ?THERAPY DIAG:  ?Difficulty in walking, not elsewhere classified ? ?Posterior tibial tendon dysfunction, left ? ?Acquired pes planus, left ? ?Hallux valgus with bunions, left ? ?PERTINENT HISTORY: Osteoporosis ? ?PRECAUTIONS: TMT joint only partially fused ? ?WEIGHT BEARING RESTRICTIONS Yes currently in CAM boot for w/b ? ?SUBJECTIVE: Pt reports that she has been doing well out of the boot with minimal pain. ? ?PAIN:  ?Are you having pain? No ?Pain location: L foot, diffuse ?NPRS scale:  ?current 2/10  ?Pain description: intermittent, sharp, and aching ?Stage: Subacute ?Stability: getting better ?24 hour pattern: no clear pattern  ? ? ?OBJECTIVE: (objective measures completed at initial evaluation unless otherwise dated) ? ? ?OBJECTIVE:  ?  ?          GENERAL OBSERVATION/GAIT: ?                    Slow gait with L CAM BOOT ?  ?SENSATION: ?         Light touch: Appears intact ?  ?PALPATION: ?TTP dorsum on L foot, over incisions ?  ?LE MMT: ?  ?MMT Right ?09/12/2021 Left ?09/12/2021  ?Hip flexion (L2, L3) 4 4  ?Knee extension (L3) 4 3+  ?Knee flexion 4 3+  ?Hip abduction 3+ 3  ?Hip extension      ?Hip external rotation      ?  Hip internal rotation      ?Hip adduction      ?Ankle dorsiflexion (L4)      ?Ankle plantarflexion (S1)      ?Ankle inversion      ?Ankle eversion      ?Great Toe ext (L5)      ?Grossly      ?  ?(Blank rows = not tested, score listed is out of 5 possible points.  N = WNL, D = diminished, C = clear for gross weakness with myotome testing, * = concordant pain with testing) ?  ?LE ROM: ?  ?ROM Right ?09/12/2021 Left ?09/12/2021  ?Hip flexion      ?Hip extension      ?Hip abduction      ?Hip adduction      ?Hip internal rotation      ?Hip external rotation      ?Knee flexion      ?Knee extension      ?Ankle dorsiflexion 25 Lacking 5  ?Ankle plantarflexion 50 45  ?Ankle inversion 35 15  ?Ankle eversion 25 5  ?  ?(Blank rows = not tested, N = WNL, * = concordant pain with testing) ?  ?PATIENT SURVEYS:   ?FOTO 68 -> 60 ?  ?  ?TODAY'S TREATMENT: ? ?[facility] Adult PT Treatment:                                                DATE: 10/03/2021 ?Therapeutic Exercise: ?Bike no resistance x 5 mins ?Slant board stretch 45'' x3 ?Heel raise 2x20 ?Tib anterior raise - 2x10 ?SLR - 3x10 - 2# ?S/L hip abduction - 3x10 ?BAPS board L3 - PF/DF, circles CW/CCW (out of brace and shoe) ?Knee ext machine 2x10 at 25# ?Knee flexion machine 2x10 at 25# ? ?Neuromuscular re-ed: ?Romberg stance on foam x45" EO (D/C) ?Semi tandem (50%) on foam 45'' bouts ?Rocker board DF/PF - 2x20 ? ?[facility] Adult PT Treatment:                                                DATE: 09/29/2021 ?Therapeutic Exercise: ?Bike no resistance x 5 mins ?SLR - 3x10 - 2# ?S/L hip abduction - 3x10 ?BAPS board L3 - PF/DF, circles CW/CCW (out of brace and shoe) ?Knee ext machine 2x10 at 10# ?Knee flexion machine 2x10 at 20# ?Omega leg press 2x10 25# BIL ?Toe yoga x1' ?Neuromuscular re-ed: ?Romberg stance EO/EC 30" each ?Tandem stance x30" BIL ?Romberg stance on foam x30" EO ? ? ?[facility] Adult PT Treatment:                                                DATE: 09/22/2021 ?Therapeutic Exercise: ?Bike no resistance - 4 ' ?Towel scrunches 2x1' ?SLR - 3x10 - 2# ?S/L hip abduction - 3x10 ?Prone SLR - 3x10 ?BAPS board L3 - fwd and lateral ?Knee ext machine 2x10 at 10# ?Knee flexion machine 2x10 at 20# ? ?Manual Therapy ?Gentle PROM for GT ext ? ?[facility] Adult PT Treatment:  DATE: 09/19/2021 ?Therapeutic Exercise: ?Ankle ABC's x2 ?Towel scrunches 2x1' ?Towel inv/ev slide x1' each ?Seated calf stretch 2x30" Lt ?Seated clamshells GTB 2x10 ?Seated marching 2x10 BIL ?Standing hip abduction 2x10 BIL ?LAQ 2x10 BIL ?Seated hamstring curls GTB 2x10 BIL ?Neuromuscular re-ed: ?Romberg stance EO/EC 30" each ?Tandem stance x30" BIL ? ?  ?  ?PATIENT EDUCATION:  ?POC, diagnosis, prognosis, HEP, and outcome measures.  Pt educated via explanation, demonstration, and  handout (HEP).  Pt confirms understanding verbally.  ?  ?ASTERISK SIGNS ?  ?  ?Asterisk Signs Eval (09/12/2021)            ?DF/ankle ROM Lacking 5 DF            ?LE MMT <=3+ hip abd, knee            ?               ?               ?               ?  ?  ?HOME EXERCISE PROGRAM: ?Access Code: [email or web address] ?URL: [email or web address] ?Date: 09/12/2021 ?Prepared by: Shearon Balo ?  ?Exercises ?- Seated Ankle Alphabet  - 3 x daily - 7 x weekly - 3 sets - 10 reps ?- Towel Scrunches  - 3 x daily - 7 x weekly - 3 sets - 10 reps ?- Ankle Inversion Eversion Towel Slide  - 3 x daily - 7 x weekly - 3 sets - 10 reps ?  ?ASSESSMENT: ?  ?CLINICAL IMPRESSION: ?Tauni did well with therapy.  Session was truncated d/t pt arriving late.  Focus on DF ROM combined with balance and PF strengthening.  Will continue to progress these as able working toward stability in SLS. ? ?  ?OBJECTIVE IMPAIRMENTS: Pain, ankle and GT ROM, gait, balance, LE strenth ?  ?ACTIVITY LIMITATIONS: walking, standing, housework ?  ?PERSONAL FACTORS: See medical history and pertinent history ?  ?  ?REHAB POTENTIAL: Good ?  ?CLINICAL DECISION MAKING: Stable/uncomplicated ?  ?EVALUATION COMPLEXITY: Low ?  ?  ?GOALS: ?  ?  ?SHORT TERM GOALS: ?  ?Felicity will be >75% HEP compliant to improve carryover between sessions and facilitate independent management of condition ?  ?Evaluation (09/12/2021): ongoing ?Target date: 10/03/2021 ?Goal status: paritally met 5/10 ?  ?  ?LONG TERM GOALS: ?  ?Robyn will improve FOTO score to 43 as a proxy for functional improvement ?  ?Evaluation/Baseline (09/12/2021): 60 ?Target date: 11/07/2021 ?Goal status: INITIAL ?  ?  ?2.  Azaliah will improve left ankle DF to 10 degrees  ?  ?Evaluation/Baseline (09/12/2021): lacking 5 degrees degrees ?Target date: 11/07/2021 ?Goal status: INITIAL ?  ?  ?3.  Solange will be able to stand for >30'' in tandem stance, to show a significant improvement in balance in order to reduce fall risk  ?   ?Evaluation/Baseline (09/12/2021): unable ?Target date: 11/07/2021 ?Goal status: INITIAL ?  ?  ?4.  Kitrina will self report >/= 50% decrease in pain from evaluation  ?  ?Evaluation/Baseline (09/12/2021): 5/10 max pain ?Tar

## 2021-10-04 NOTE — Telephone Encounter (Signed)
Please advise 

## 2021-10-05 ENCOUNTER — Ambulatory Visit: Payer: PPO | Admitting: Physical Therapy

## 2021-10-10 ENCOUNTER — Ambulatory Visit: Payer: PPO | Admitting: Physical Therapy

## 2021-10-12 ENCOUNTER — Ambulatory Visit: Payer: PPO | Admitting: Physical Therapy

## 2021-10-12 ENCOUNTER — Encounter: Payer: Self-pay | Admitting: Physical Therapy

## 2021-10-12 DIAGNOSIS — M2142 Flat foot [pes planus] (acquired), left foot: Secondary | ICD-10-CM

## 2021-10-12 DIAGNOSIS — R293 Abnormal posture: Secondary | ICD-10-CM

## 2021-10-12 DIAGNOSIS — M2012 Hallux valgus (acquired), left foot: Secondary | ICD-10-CM

## 2021-10-12 DIAGNOSIS — M76822 Posterior tibial tendinitis, left leg: Secondary | ICD-10-CM

## 2021-10-12 DIAGNOSIS — R262 Difficulty in walking, not elsewhere classified: Secondary | ICD-10-CM

## 2021-10-12 NOTE — Therapy (Signed)
OUTPATIENT PHYSICAL THERAPY TREATMENT NOTE   Patient Name: Stacy Decker MRN: [id number] DOB:[birth year], 71 y.o., female Today's Date: 10/12/2021  PCP: Marcellina Millin REFERRING PROVIDER: Criselda Peaches, DPM  END OF SESSION:   PT End of Session - 10/12/21 1052     Visit Number 6    Date for PT Re-Evaluation 11/07/21    Authorization Type [employer] - FOTO - MCR    PT Start Time 4580    PT Stop Time 1130    PT Time Calculation (min) 38 min    Activity Tolerance Patient tolerated treatment well    Behavior During Therapy WFL for tasks assessed/performed              Past Medical History:  Diagnosis Date   Anxiety    Depression    History of vertigo    Osteoporosis    Substance abuse ([place])    recovering alcoholic-sober since 9983   Past Surgical History:  Procedure Laterality Date   AUGMENTATION MAMMAPLASTY Bilateral    BUNIONECTOMY Left 06/29/2021   Procedure: BUNIONECTOMY;  Surgeon: Criselda Peaches, DPM;  Location: [facility] ORS;  Service: Podiatry;  Laterality: Left;  POPLITEAL AND SAPHENOUS BLOCK   carpel tunnel Bilateral    COLONOSCOPY     FLAT FOOT CORRECTION Left 06/29/2021   Procedure: FLAT FOOT CORRECTION;  Surgeon: Criselda Peaches, DPM;  Location: [facility] ORS;  Service: Podiatry;  Laterality: Left;   PLACEMENT OF BREAST IMPLANTS     Patient Active Problem List   Diagnosis Date Noted   Pes planus    Arthritis of left subtalar joint    Acquired hallux valgus of left foot    Status post left foot surgery 06/29/2021   History of tobacco abuse 06/29/2021   Recovering alcoholic ([place]) 38/25/0539   Spider veins 02/09/2020   Obesity (BMI 30-39.9) 07/31/2018   Estrogen deficiency 07/31/2018   Prediabetes 05/29/2017   Anxiety and depression 05/29/2017   Screening for cervical cancer 05/29/2017   DVT prophylaxis 05/29/2017   Osteoporosis without current pathological fracture 03/12/2016    REFERRING DIAG: Criselda Peaches, DPM  THERAPY  DIAG:  Difficulty in walking, not elsewhere classified  Posterior tibial tendon dysfunction, left  Acquired pes planus, left  Hallux valgus with bunions, left  Abnormal posture  PERTINENT HISTORY: Osteoporosis  PRECAUTIONS: TMT joint only partially fused  WEIGHT BEARING RESTRICTIONS Yes currently in CAM boot for w/b  SUBJECTIVE:   Pt reports that she had some swelling and pain after last session, but this has now subsided.  PAIN:  Are you having pain? No Pain location: L foot, diffuse NPRS scale:  current 2/10  Pain description: intermittent, sharp, and aching Stage: Subacute Stability: getting better 24 hour pattern: no clear pattern    OBJECTIVE: (objective measures completed at initial evaluation unless otherwise dated)   OBJECTIVE:              GENERAL OBSERVATION/GAIT:                     Slow gait with L CAM BOOT   SENSATION:          Light touch: Appears intact   PALPATION: TTP dorsum on L foot, over incisions   LE MMT:   MMT Right 09/12/2021 Left 09/12/2021  Hip flexion (L2, L3) 4 4  Knee extension (L3) 4 3+  Knee flexion 4 3+  Hip abduction 3+ 3  Hip extension  Hip external rotation      Hip internal rotation      Hip adduction      Ankle dorsiflexion (L4)      Ankle plantarflexion (S1)      Ankle inversion      Ankle eversion      Great Toe ext (L5)      Grossly        (Blank rows = not tested, score listed is out of 5 possible points.  N = WNL, D = diminished, C = clear for gross weakness with myotome testing, * = concordant pain with testing)   LE ROM:   ROM Right 09/12/2021 Left 09/12/2021  Hip flexion      Hip extension      Hip abduction      Hip adduction      Hip internal rotation      Hip external rotation      Knee flexion      Knee extension      Ankle dorsiflexion 25 Lacking 5  Ankle plantarflexion 50 45  Ankle inversion 35 15  Ankle eversion 25 5    (Blank rows = not tested, N = WNL, * = concordant pain with  testing)   PATIENT SURVEYS:  FOTO 43 -> 60     TODAY'S TREATMENT:  [facility] Adult PT Treatment:                                                DATE: 10/12/2021 Therapeutic Exercise: Bike L 4 x 5 mins Leg press heel raise - 10# - 3x10 Tib anterior raise on leg press - 2x10 Ankle 4 way - GTB - 2x20 ea BAPS board L3 - PF/DF, circles CW/CCW (out of brace and shoe) Knee ext machine 2x10 at 25# Knee flexion machine 2x10 at 25#  Neuromuscular re-ed: Semi tandem (75%) on foam 45'' bouts Rocker board DF/PF - 2x20  [facility] Adult PT Treatment:                                                DATE: 10/03/2021 Therapeutic Exercise: Bike no resistance x 5 mins Slant board stretch 45'' x3 Heel raise 2x20 Tib anterior raise - 2x10 SLR - 3x10 - 2# S/L hip abduction - 3x10 BAPS board L3 - PF/DF, circles CW/CCW (out of brace and shoe) Knee ext machine 2x10 at 25# Knee flexion machine 2x10 at 25#  Neuromuscular re-ed: Romberg stance on foam x45" EO (D/C) Semi tandem (50%) on foam 45'' bouts Rocker board DF/PF - 2x20  [facility] Adult PT Treatment:                                                DATE: 09/29/2021 Therapeutic Exercise: Bike no resistance x 5 mins SLR - 3x10 - 2# S/L hip abduction - 3x10 BAPS board L3 - PF/DF, circles CW/CCW (out of brace and shoe) Knee ext machine 2x10 at 10# Knee flexion machine 2x10 at 20# Omega leg press 2x10 25# BIL Toe yoga x1' Neuromuscular re-ed: Romberg stance EO/EC 30" each Tandem stance x30" BIL  Romberg stance on foam x30" EO   [facility] Adult PT Treatment:                                                DATE: 09/22/2021 Therapeutic Exercise: Bike no resistance - 4 ' Towel scrunches 2x1' SLR - 3x10 - 2# S/L hip abduction - 3x10 Prone SLR - 3x10 BAPS board L3 - fwd and lateral Knee ext machine 2x10 at 10# Knee flexion machine 2x10 at 20#  Manual Therapy Gentle PROM for GT ext  [facility] Adult PT Treatment:                                                DATE:  09/19/2021 Therapeutic Exercise: Ankle ABC's x2 Towel scrunches 2x1' Towel inv/ev slide x1' each Seated calf stretch 2x30" Lt Seated clamshells GTB 2x10 Seated marching 2x10 BIL Standing hip abduction 2x10 BIL LAQ 2x10 BIL Seated hamstring curls GTB 2x10 BIL Neuromuscular re-ed: Romberg stance EO/EC 30" each Tandem stance x30" BIL      PATIENT EDUCATION:  POC, diagnosis, prognosis, HEP, and outcome measures.  Pt educated via explanation, demonstration, and handout (HEP).  Pt confirms understanding verbally.    ASTERISK SIGNS     Asterisk Signs Eval (09/12/2021)            DF/ankle ROM Lacking 5 DF            LE MMT <=3+ hip abd, knee                                                             HOME EXERCISE PROGRAM: Access Code: [email or web address] URL: [email or web address] Date: 10/12/2021 Prepared by: Shearon Balo  Exercises - Seated Ankle Alphabet  - 3 x daily - 7 x weekly - 3 sets - 10 reps - Towel Scrunches  - 3 x daily - 7 x weekly - 3 sets - 10 reps - Ankle Inversion Eversion Towel Slide  - 3 x daily - 7 x weekly - 3 sets - 10 reps - Long Sitting Ankle Dorsiflexion with Anchored Resistance  - 1 x daily - 7 x weekly - 3 sets - 10 reps - Long Sitting Ankle Inversion with Resistance  - 1 x daily - 7 x weekly - 3 sets - 10 reps - Long Sitting Ankle Plantar Flexion with Resistance  - 1 x daily - 7 x weekly - 3 sets - 10 reps - Long Sitting Ankle Eversion with Resistance  - 1 x daily - 7 x weekly - 3 sets - 10 reps - Seated Self Great Toe Stretch  - 1 x daily - 7 x weekly - 3 sets - 10 reps   ASSESSMENT:   CLINICAL IMPRESSION: Stacy Decker did well with therapy today.  We reduced intensity today d/t some soreness and swelling after last visit.  Updated HEP.  I instructed her to ice and elevate after exercise or with pain and swelling; she confirms understanding.    OBJECTIVE IMPAIRMENTS: Pain, ankle and GT ROM, gait, balance, LE strenth  ACTIVITY LIMITATIONS:  walking, standing, housework   PERSONAL FACTORS: See medical history and pertinent history     REHAB POTENTIAL: Good   CLINICAL DECISION MAKING: Stable/uncomplicated   EVALUATION COMPLEXITY: Low     GOALS:     SHORT TERM GOALS:   Stacy Decker will be >75% HEP compliant to improve carryover between sessions and facilitate independent management of condition   Evaluation (09/12/2021): ongoing Target date: 10/03/2021 Goal status: paritally met 5/10     LONG TERM GOALS:   Stacy Decker will improve FOTO score to 43 as a proxy for functional improvement   Evaluation/Baseline (09/12/2021): 60 Target date: 11/07/2021 Goal status: INITIAL     2.  Stacy Decker will improve left ankle DF to 10 degrees    Evaluation/Baseline (09/12/2021): lacking 5 degrees degrees Target date: 11/07/2021 Goal status: INITIAL     3.  Stacy Decker will be able to stand for >30'' in tandem stance, to show a significant improvement in balance in order to reduce fall risk    Evaluation/Baseline (09/12/2021): unable Target date: 11/07/2021 Goal status: INITIAL     4.  Stacy Decker will self report >/= 50% decrease in pain from evaluation    Evaluation/Baseline (09/12/2021): 5/10 max pain Target date: 11/07/2021 Goal status: INITIAL     5.  Stacy Decker will improve the following MMTs to >/= 4/5 to show improvement in strength:  hip abduction, knee ext, knee flexion    Evaluation/Baseline (09/12/2021): see chart in note Target date: 11/07/2021 Goal status: INITIAL       PLAN: PT FREQUENCY: 1-2x/week   PT DURATION: 8 weeks (Ending 11/07/2021)   PLANNED INTERVENTIONS: Therapeutic exercises, Aquatic therapy, Therapeutic activity, Neuro Muscular re-education, Gait training, Patient/Family education, Joint mobilization, Dry Needling, Electrical stimulation, Spinal mobilization and/or manipulation, Moist heat, Taping, Vasopneumatic device, Ionotophoresis 56m/ml Dexamethasone, and Manual therapy   PLAN FOR NEXT SESSION: progressive balance, strength,  gait    KMathis Dad PT 10/12/2021, 10:53 AM

## 2021-10-17 ENCOUNTER — Encounter: Payer: Self-pay | Admitting: Physical Therapy

## 2021-10-17 ENCOUNTER — Ambulatory Visit: Payer: PPO | Admitting: Physical Therapy

## 2021-10-17 DIAGNOSIS — M76822 Posterior tibial tendinitis, left leg: Secondary | ICD-10-CM

## 2021-10-17 DIAGNOSIS — R262 Difficulty in walking, not elsewhere classified: Secondary | ICD-10-CM | POA: Diagnosis not present

## 2021-10-17 NOTE — Therapy (Signed)
OUTPATIENT PHYSICAL THERAPY TREATMENT NOTE   Patient Name: Stacy Decker MRN: [id number] DOB:[birth year], 71 y.o., female Today's Date: 10/17/2021  PCP: Marcellina Millin REFERRING PROVIDER: Criselda Peaches, DPM  END OF SESSION:   PT End of Session - 10/17/21 1049     Visit Number 7    Date for PT Re-Evaluation 11/07/21    Authorization Type [employer] - FOTO - MCR    PT Start Time 1048    PT Stop Time 1128    PT Time Calculation (min) 40 min    Activity Tolerance Patient tolerated treatment well    Behavior During Therapy WFL for tasks assessed/performed              Past Medical History:  Diagnosis Date   Anxiety    Depression    History of vertigo    Osteoporosis    Substance abuse ([place])    recovering alcoholic-sober since 8887   Past Surgical History:  Procedure Laterality Date   AUGMENTATION MAMMAPLASTY Bilateral    BUNIONECTOMY Left 06/29/2021   Procedure: BUNIONECTOMY;  Surgeon: Criselda Peaches, DPM;  Location: [facility];  Service: Podiatry;  Laterality: Left;  POPLITEAL AND SAPHENOUS BLOCK   carpel tunnel Bilateral    COLONOSCOPY     FLAT FOOT CORRECTION Left 06/29/2021   Procedure: FLAT FOOT CORRECTION;  Surgeon: Criselda Peaches, DPM;  Location: [facility];  Service: Podiatry;  Laterality: Left;   PLACEMENT OF BREAST IMPLANTS     Patient Active Problem List   Diagnosis Date Noted   Pes planus    Arthritis of left subtalar joint    Acquired hallux valgus of left foot    Status post left foot surgery 06/29/2021   History of tobacco abuse 06/29/2021   Recovering alcoholic ([place]) 57/97/2820   Spider veins 02/09/2020   Obesity (BMI 30-39.9) 07/31/2018   Estrogen deficiency 07/31/2018   Prediabetes 05/29/2017   Anxiety and depression 05/29/2017   Screening for cervical cancer 05/29/2017   DVT prophylaxis 05/29/2017   Osteoporosis without current pathological fracture 03/12/2016    REFERRING DIAG: Criselda Peaches, DPM  THERAPY  DIAG:  Difficulty in walking, not elsewhere classified  Posterior tibial tendon dysfunction, left  PERTINENT HISTORY: Osteoporosis  PRECAUTIONS: TMT joint only partially fused  WEIGHT BEARING RESTRICTIONS Yes currently in CAM boot for w/b  SUBJECTIVE:   Pt reports that she is doing well overall.  She is worried her arch is starting to collapse.   PAIN:  Are you having pain? No Pain location: L foot, diffuse NPRS scale:  current 2/10  Pain description: intermittent, sharp, and aching Stage: Subacute Stability: getting better 24 hour pattern: no clear pattern    OBJECTIVE: (objective measures completed at initial evaluation unless otherwise dated)   OBJECTIVE:              GENERAL OBSERVATION/GAIT:                     Slow gait with L CAM BOOT   SENSATION:          Light touch: Appears intact   PALPATION: TTP dorsum on L foot, over incisions   LE MMT:   MMT Right 09/12/2021 Left 09/12/2021  Hip flexion (L2, L3) 4 4  Knee extension (L3) 4 3+  Knee flexion 4 3+  Hip abduction 3+ 3  Hip extension      Hip external rotation      Hip internal rotation  Hip adduction      Ankle dorsiflexion (L4)      Ankle plantarflexion (S1)      Ankle inversion      Ankle eversion      Great Toe ext (L5)      Grossly        (Blank rows = not tested, score listed is out of 5 possible points.  N = WNL, D = diminished, C = clear for gross weakness with myotome testing, * = concordant pain with testing)   LE ROM:   ROM Right 09/12/2021 Left 09/12/2021  Hip flexion      Hip extension      Hip abduction      Hip adduction      Hip internal rotation      Hip external rotation      Knee flexion      Knee extension      Ankle dorsiflexion 25 Lacking 5  Ankle plantarflexion 50 45  Ankle inversion 35 15  Ankle eversion 25 5    (Blank rows = not tested, N = WNL, * = concordant pain with testing)   PATIENT SURVEYS:  FOTO 43 -> 60     TODAY'S TREATMENT:  [facility] Adult  PT Treatment:                                                DATE: 10/17/2021 Therapeutic Exercise: Bike L 4 x 5 mins Slant board stretch 3x 45'' Leg press heel raise - 10# - 3x15 Tib anterior raise on leg press - 3x15 Hurdle walking in //  Knee ext machine 2x10 at 25# Knee flexion machine 2x10 at 35#  Neuromuscular re-ed: Tandem on foam 45'' bouts Rocker board DF/PF - 2x20  [facility] Adult PT Treatment:                                                DATE: 10/12/2021 Therapeutic Exercise: Bike L 4 x 5 mins Leg press heel raise - 10# - 3x10 Tib anterior raise on leg press - 2x10 Ankle 4 way - GTB - 2x20 ea BAPS board L3 - PF/DF, circles CW/CCW (out of brace and shoe) Knee ext machine 2x10 at 25# Knee flexion machine 2x10 at 25#  Neuromuscular re-ed: Semi tandem (75%) on foam 45'' bouts Rocker board DF/PF - 2x20  [facility] Adult PT Treatment:                                                DATE: 10/03/2021 Therapeutic Exercise: Bike no resistance x 5 mins Slant board stretch 45'' x3 Heel raise 2x20 Tib anterior raise - 2x10 SLR - 3x10 - 2# S/L hip abduction - 3x10 BAPS board L3 - PF/DF, circles CW/CCW (out of brace and shoe) Knee ext machine 2x10 at 25# Knee flexion machine 2x10 at 25#  Neuromuscular re-ed: Romberg stance on foam x45" EO (D/C) Semi tandem (50%) on foam 45'' bouts Rocker board DF/PF - 2x20  [facility] Adult PT Treatment:  DATE: 09/29/2021 Therapeutic Exercise: Bike no resistance x 5 mins SLR - 3x10 - 2# S/L hip abduction - 3x10 BAPS board L3 - PF/DF, circles CW/CCW (out of brace and shoe) Knee ext machine 2x10 at 10# Knee flexion machine 2x10 at 20# Omega leg press 2x10 25# BIL Toe yoga x1' Neuromuscular re-ed: Romberg stance EO/EC 30" each Tandem stance x30" BIL Romberg stance on foam x30" EO   [facility] Adult PT Treatment:                                                DATE: 09/22/2021 Therapeutic Exercise: Bike no  resistance - 4 ' Towel scrunches 2x1' SLR - 3x10 - 2# S/L hip abduction - 3x10 Prone SLR - 3x10 BAPS board L3 - fwd and lateral Knee ext machine 2x10 at 10# Knee flexion machine 2x10 at 20#  Manual Therapy Gentle PROM for GT ext  [facility] Adult PT Treatment:                                                DATE: 09/19/2021 Therapeutic Exercise: Ankle ABC's x2 Towel scrunches 2x1' Towel inv/ev slide x1' each Seated calf stretch 2x30" Lt Seated clamshells GTB 2x10 Seated marching 2x10 BIL Standing hip abduction 2x10 BIL LAQ 2x10 BIL Seated hamstring curls GTB 2x10 BIL Neuromuscular re-ed: Romberg stance EO/EC 30" each Tandem stance x30" BIL      PATIENT EDUCATION:  POC, diagnosis, prognosis, HEP, and outcome measures.  Pt educated via explanation, demonstration, and handout (HEP).  Pt confirms understanding verbally.    ASTERISK SIGNS     Asterisk Signs Eval (09/12/2021) 5/24           DF/ankle ROM Lacking 5 DF            LE MMT <=3+ hip abd, knee             Balance    45'' on foam                                            HOME EXERCISE PROGRAM: Access Code: [email or web address] URL: [email or web address] Date: 10/17/2021 Prepared by: Shearon Balo  Exercises - Seated Ankle Alphabet  - 3 x daily - 7 x weekly - 3 sets - 10 reps - Towel Scrunches  - 3 x daily - 7 x weekly - 3 sets - 10 reps - Ankle Inversion Eversion Towel Slide  - 3 x daily - 7 x weekly - 3 sets - 10 reps - Long Sitting Ankle Dorsiflexion with Anchored Resistance  - 1 x daily - 7 x weekly - 3 sets - 10 reps - Long Sitting Ankle Inversion with Resistance  - 1 x daily - 7 x weekly - 3 sets - 10 reps - Long Sitting Ankle Plantar Flexion with Resistance  - 1 x daily - 7 x weekly - 3 sets - 10 reps - Long Sitting Ankle Eversion with Resistance  - 1 x daily - 7 x weekly - 3 sets - 10 reps - Seated Self Great Toe Stretch  - 1 x daily - 7 x weekly -  3 sets - 10 reps - Walking March  - 1 x daily - 7 x  weekly - 3 sets - 10 reps   ASSESSMENT:   CLINICAL IMPRESSION: Stacy Decker did well with therapy.  We concentrated on stance phase of gait with hurdles and walking march today to good effect.  Balance significantly improved.  Talked about possible orthotics when she goes back to podiatrist.     OBJECTIVE IMPAIRMENTS: Pain, ankle and GT ROM, gait, balance, LE strength   ACTIVITY LIMITATIONS: walking, standing, housework   PERSONAL FACTORS: See medical history and pertinent history     REHAB POTENTIAL: Good   CLINICAL DECISION MAKING: Stable/uncomplicated   EVALUATION COMPLEXITY: Low     GOALS:     SHORT TERM GOALS:   Stacy Decker will be >75% HEP compliant to improve carryover between sessions and facilitate independent management of condition   Evaluation (09/12/2021): ongoing Target date: 10/03/2021 Goal status: paritally met 5/10     LONG TERM GOALS:   Stacy Decker will improve FOTO score to 43 as a proxy for functional improvement   Evaluation/Baseline (09/12/2021): 60 Target date: 11/07/2021 Goal status: INITIAL     2.  Stacy Decker will improve left ankle DF to 10 degrees    Evaluation/Baseline (09/12/2021): lacking 5 degrees degrees Target date: 11/07/2021 Goal status: INITIAL     3.  Stacy Decker will be able to stand for >30'' in tandem stance, to show a significant improvement in balance in order to reduce fall risk    Evaluation/Baseline (09/12/2021): unable Target date: 11/07/2021 Goal status: INITIAL     4.  Stacy Decker will self report >/= 50% decrease in pain from evaluation    Evaluation/Baseline (09/12/2021): 5/10 max pain Target date: 11/07/2021 Goal status: INITIAL     5.  Stacy Decker will improve the following MMTs to >/= 4/5 to show improvement in strength:  hip abduction, knee ext, knee flexion    Evaluation/Baseline (09/12/2021): see chart in note Target date: 11/07/2021 Goal status: INITIAL       PLAN: PT FREQUENCY: 1-2x/week   PT DURATION: 8 weeks (Ending 11/07/2021)   PLANNED  INTERVENTIONS: Therapeutic exercises, Aquatic therapy, Therapeutic activity, Neuro Muscular re-education, Gait training, Patient/Family education, Joint mobilization, Dry Needling, Electrical stimulation, Spinal mobilization and/or manipulation, Moist heat, Taping, Vasopneumatic device, Ionotophoresis 59m/ml Dexamethasone, and Manual therapy   PLAN FOR NEXT SESSION: progressive balance, strength, gait    KMathis Dad PT 10/17/2021, 11:30 AM

## 2021-10-19 ENCOUNTER — Ambulatory Visit: Payer: PPO | Admitting: Physical Therapy

## 2021-10-19 ENCOUNTER — Encounter: Payer: Self-pay | Admitting: Physical Therapy

## 2021-10-19 DIAGNOSIS — M76822 Posterior tibial tendinitis, left leg: Secondary | ICD-10-CM

## 2021-10-19 DIAGNOSIS — R262 Difficulty in walking, not elsewhere classified: Secondary | ICD-10-CM | POA: Diagnosis not present

## 2021-10-19 NOTE — Therapy (Signed)
OUTPATIENT PHYSICAL THERAPY TREATMENT NOTE   Patient Name: Kinaya Hilliker MRN: [id number] DOB:June 06, 1950, 71 y.o., female Today's Date: 10/19/2021  PCP: Marcellina Millin REFERRING PROVIDER: Marcellina Millin  END OF SESSION:   PT End of Session - 10/19/21 1048     Visit Number 8    Date for PT Re-Evaluation 11/07/21    Authorization Type [employer] - FOTO - MCR    PT Start Time 1048    PT Stop Time 1128    PT Time Calculation (min) 40 min    Activity Tolerance Patient tolerated treatment well    Behavior During Therapy WFL for tasks assessed/performed              Past Medical History:  Diagnosis Date   Anxiety    Depression    History of vertigo    Osteoporosis    Substance abuse ([place])    recovering alcoholic-sober since 0017   Past Surgical History:  Procedure Laterality Date   AUGMENTATION MAMMAPLASTY Bilateral    BUNIONECTOMY Left 06/29/2021   Procedure: BUNIONECTOMY;  Surgeon: Criselda Peaches, DPM;  Location: [facility];  Service: Podiatry;  Laterality: Left;  POPLITEAL AND SAPHENOUS BLOCK   carpel tunnel Bilateral    COLONOSCOPY     FLAT FOOT CORRECTION Left 06/29/2021   Procedure: FLAT FOOT CORRECTION;  Surgeon: Criselda Peaches, DPM;  Location: [facility];  Service: Podiatry;  Laterality: Left;   PLACEMENT OF BREAST IMPLANTS     Patient Active Problem List   Diagnosis Date Noted   Pes planus    Arthritis of left subtalar joint    Acquired hallux valgus of left foot    Status post left foot surgery 06/29/2021   History of tobacco abuse 06/29/2021   Recovering alcoholic ([place]) 49/44/9675   Spider veins 02/09/2020   Obesity (BMI 30-39.9) 07/31/2018   Estrogen deficiency 07/31/2018   Prediabetes 05/29/2017   Anxiety and depression 05/29/2017   Screening for cervical cancer 05/29/2017   DVT prophylaxis 05/29/2017   Osteoporosis without current pathological fracture 03/12/2016    REFERRING DIAG: Criselda Peaches, DPM  THERAPY  DIAG:  Difficulty in walking, not elsewhere classified  Posterior tibial tendon dysfunction, left  PERTINENT HISTORY: Osteoporosis  PRECAUTIONS: TMT joint only partially fused  WEIGHT BEARING RESTRICTIONS Yes currently in CAM boot for w/b  SUBJECTIVE:   Pt reports continued improvement in her foot.  She feels there has been less swelling recently.  PAIN:  Are you having pain? No Pain location: L foot, diffuse NPRS scale:  current 2/10  Pain description: intermittent, sharp, and aching Stage: Subacute Stability: getting better 24 hour pattern: no clear pattern    OBJECTIVE: (objective measures completed at initial evaluation unless otherwise dated)   OBJECTIVE:              GENERAL OBSERVATION/GAIT:                     Slow gait with L CAM BOOT   SENSATION:          Light touch: Appears intact   PALPATION: TTP dorsum on L foot, over incisions   LE MMT:   MMT Right 09/12/2021 Left 09/12/2021  Hip flexion (L2, L3) 4 4  Knee extension (L3) 4 3+  Knee flexion 4 3+  Hip abduction 3+ 3  Hip extension      Hip external rotation      Hip internal rotation  Hip adduction      Ankle dorsiflexion (L4)      Ankle plantarflexion (S1)      Ankle inversion      Ankle eversion      Great Toe ext (L5)      Grossly        (Blank rows = not tested, score listed is out of 5 possible points.  N = WNL, D = diminished, C = clear for gross weakness with myotome testing, * = concordant pain with testing)   LE ROM:   ROM Right 09/12/2021 Left 09/12/2021  Hip flexion      Hip extension      Hip abduction      Hip adduction      Hip internal rotation      Hip external rotation      Knee flexion      Knee extension      Ankle dorsiflexion 25 Lacking 5  Ankle plantarflexion 50 45  Ankle inversion 35 15  Ankle eversion 25 5    (Blank rows = not tested, N = WNL, * = concordant pain with testing)   PATIENT SURVEYS:  FOTO 43 -> 60       HOME EXERCISE  PROGRAM: Access Code: [email or web address] URL: [email or web address] Date: 10/17/2021 Prepared by: Shearon Balo  Exercises - Seated Ankle Alphabet  - 3 x daily - 7 x weekly - 3 sets - 10 reps - Towel Scrunches  - 3 x daily - 7 x weekly - 3 sets - 10 reps - Ankle Inversion Eversion Towel Slide  - 3 x daily - 7 x weekly - 3 sets - 10 reps - Long Sitting Ankle Dorsiflexion with Anchored Resistance  - 1 x daily - 7 x weekly - 3 sets - 10 reps - Long Sitting Ankle Inversion with Resistance  - 1 x daily - 7 x weekly - 3 sets - 10 reps - Long Sitting Ankle Plantar Flexion with Resistance  - 1 x daily - 7 x weekly - 3 sets - 10 reps - Long Sitting Ankle Eversion with Resistance  - 1 x daily - 7 x weekly - 3 sets - 10 reps - Seated Self Great Toe Stretch  - 1 x daily - 7 x weekly - 3 sets - 10 reps - Walking March  - 1 x daily - 7 x weekly - 3 sets - 10 reps   ASTERISK SIGNS     Asterisk Signs Eval (09/12/2021) 5/24           DF/ankle ROM Lacking 5 DF            LE MMT <=3+ hip abd, knee             Balance    45'' on foam                                            TODAY'S TREATMENT:  [facility] Adult PT Treatment:                                                DATE: 10/17/2021 Therapeutic Exercise: Bike L 4 x 5 mins Slant board stretch 3x 45'' Standing heel raise - 3x15 Hurdle walking in // (  NT) Knee ext machine 3x10 at 25# Knee flexion machine 3x10 at 35# Lateral walking with GTB - 4 laps  Neuromuscular re-ed: Tandem on foam 45'' bouts SLS 45'' bouts with single finger support Wooden rocker board DF/PF - 2x20  [facility] Adult PT Treatment:                                                DATE: 10/17/2021 Therapeutic Exercise: Bike L 4 x 5 mins Slant board stretch 3x 45'' Leg press heel raise - 10# - 3x15 Tib anterior raise on leg press - 3x15 Hurdle walking in //  Knee ext machine 2x10 at 25# Knee flexion machine 2x10 at 35#  Neuromuscular re-ed: Tandem on foam 45''  bouts Rocker board DF/PF - 2x20  [facility] Adult PT Treatment:                                                DATE: 10/12/2021 Therapeutic Exercise: Bike L 4 x 5 mins Leg press heel raise - 10# - 3x10 Tib anterior raise on leg press - 2x10 Ankle 4 way - GTB - 2x20 ea BAPS board L3 - PF/DF, circles CW/CCW (out of brace and shoe) Knee ext machine 2x10 at 25# Knee flexion machine 2x10 at 25#  Neuromuscular re-ed: Semi tandem (75%) on foam 45'' bouts Rocker board DF/PF - 2x20  [facility] Adult PT Treatment:                                                DATE: 10/03/2021 Therapeutic Exercise: Bike no resistance x 5 mins Slant board stretch 45'' x3 Heel raise 2x20 Tib anterior raise - 2x10 SLR - 3x10 - 2# S/L hip abduction - 3x10 BAPS board L3 - PF/DF, circles CW/CCW (out of brace and shoe) Knee ext machine 2x10 at 25# Knee flexion machine 2x10 at 25#  Neuromuscular re-ed: Romberg stance on foam x45" EO (D/C) Semi tandem (50%) on foam 45'' bouts Rocker board DF/PF - 2x20  [facility] Adult PT Treatment:                                                DATE: 09/29/2021 Therapeutic Exercise: Bike no resistance x 5 mins SLR - 3x10 - 2# S/L hip abduction - 3x10 BAPS board L3 - PF/DF, circles CW/CCW (out of brace and shoe) Knee ext machine 2x10 at 10# Knee flexion machine 2x10 at 20# Omega leg press 2x10 25# BIL Toe yoga x1' Neuromuscular re-ed: Romberg stance EO/EC 30" each Tandem stance x30" BIL Romberg stance on foam x30" EO   [facility] Adult PT Treatment:                                                DATE: 09/22/2021 Therapeutic Exercise: Bike no resistance - 4 ' Towel scrunches 2x1' SLR -  3x10 - 2# S/L hip abduction - 3x10 Prone SLR - 3x10 BAPS board L3 - fwd and lateral Knee ext machine 2x10 at 10# Knee flexion machine 2x10 at 20#  Manual Therapy Gentle PROM for GT ext  [facility] Adult PT Treatment:                                                DATE: 09/19/2021 Therapeutic Exercise: Ankle  ABC's x2 Towel scrunches 2x1' Towel inv/ev slide x1' each Seated calf stretch 2x30" Lt Seated clamshells GTB 2x10 Seated marching 2x10 BIL Standing hip abduction 2x10 BIL LAQ 2x10 BIL Seated hamstring curls GTB 2x10 BIL Neuromuscular re-ed: Romberg stance EO/EC 30" each Tandem stance x30" BIL         ASSESSMENT:   CLINICAL IMPRESSION: Jeneen is doing well with therapy.  We were able to increase intensity of PF today with standing heel raise to good effect.  At this point we need to get Shevaun comfortable with SLS to help normalize gait.  She is making progress toward this goal.    OBJECTIVE IMPAIRMENTS: Pain, ankle and GT ROM, gait, balance, LE strength   ACTIVITY LIMITATIONS: walking, standing, housework   PERSONAL FACTORS: See medical history and pertinent history     REHAB POTENTIAL: Good   CLINICAL DECISION MAKING: Stable/uncomplicated   EVALUATION COMPLEXITY: Low     GOALS:     SHORT TERM GOALS:   [place] will be >75% HEP compliant to improve carryover between sessions and facilitate independent management of condition   Evaluation (09/12/2021): ongoing Target date: 10/03/2021 Goal status: paritally met 5/10     LONG TERM GOALS:   Sheniqua will improve FOTO score to 43 as a proxy for functional improvement   Evaluation/Baseline (09/12/2021): 60 Target date: 11/07/2021 Goal status: INITIAL     2.  Kaarin will improve left ankle DF to 10 degrees    Evaluation/Baseline (09/12/2021): lacking 5 degrees degrees Target date: 11/07/2021 Goal status: INITIAL     3.  Chelli will be able to stand for >30'' in tandem stance, to show a significant improvement in balance in order to reduce fall risk    Evaluation/Baseline (09/12/2021): unable Target date: 11/07/2021 Goal status: INITIAL     4.  Kit will self report >/= 50% decrease in pain from evaluation    Evaluation/Baseline (09/12/2021): 5/10 max pain Target date: 11/07/2021 Goal status: INITIAL     5.  Lyly will  improve the following MMTs to >/= 4/5 to show improvement in strength:  hip abduction, knee ext, knee flexion    Evaluation/Baseline (09/12/2021): see chart in note Target date: 11/07/2021 Goal status: INITIAL       PLAN: PT FREQUENCY: 1-2x/week   PT DURATION: 8 weeks (Ending 11/07/2021)   PLANNED INTERVENTIONS: Therapeutic exercises, Aquatic therapy, Therapeutic activity, Neuro Muscular re-education, Gait training, Patient/Family education, Joint mobilization, Dry Needling, Electrical stimulation, Spinal mobilization and/or manipulation, Moist heat, Taping, Vasopneumatic device, Ionotophoresis 78m/ml Dexamethasone, and Manual therapy   PLAN FOR NEXT SESSION: progressive balance, strength, gait    KMathis Dad PT 10/19/2021, 11:31 AM

## 2021-10-24 ENCOUNTER — Encounter: Payer: Self-pay | Admitting: Physical Therapy

## 2021-10-24 ENCOUNTER — Ambulatory Visit: Payer: PPO | Admitting: Physical Therapy

## 2021-10-24 DIAGNOSIS — R262 Difficulty in walking, not elsewhere classified: Secondary | ICD-10-CM

## 2021-10-24 NOTE — Therapy (Addendum)
PHYSICAL THERAPY UNPLANNED DISCHARGE SUMMARY   Visits from Start of Care: 9  Current functional level related to goals / functional outcomes: Current status unknown   Remaining deficits: Current status unknown   Education / Equipment: Pt has not returned since visit listed below  Patient goals were not assessed. Patient is being discharged due to not returning since the last visit.  (the below note was addended to include the above D/C summary on 11/07/21)  OUTPATIENT PHYSICAL THERAPY TREATMENT NOTE   Patient Name: Stacy Decker MRN: [id number] DOB:05-05-1951, 71 y.o., female Today's Date: 10/24/2021  PCP: Marcellina Millin REFERRING PROVIDER: Criselda Peaches, DPM  END OF SESSION:   PT End of Session - 10/24/21 1101     Visit Number 9    Date for PT Re-Evaluation 11/07/21    Authorization Type [employer] - FOTO - MCR    PT Start Time 1050    PT Stop Time 1130    PT Time Calculation (min) 40 min    Activity Tolerance Patient tolerated treatment well    Behavior During Therapy WFL for tasks assessed/performed              Past Medical History:  Diagnosis Date   Anxiety    Depression    History of vertigo    Osteoporosis    Substance abuse ([place])    recovering alcoholic-sober since 8921   Past Surgical History:  Procedure Laterality Date   AUGMENTATION MAMMAPLASTY Bilateral    BUNIONECTOMY Left 06/29/2021   Procedure: BUNIONECTOMY;  Surgeon: Criselda Peaches, DPM;  Location: [facility] OR[facility];  Service: Podiatry;  Laterality: Left;  POPLITEAL AND SAPHENOUS BLOCK   carpel tunnel Bilateral    COLONOSCOPY     FLAT FOOT CORRECTION Left 06/29/2021   Procedure: FLAT FOOT CORRECTION;  Surgeon: Criselda Peaches, DPM;  Location: [facility];  Service: Podiatry;  Laterality: Left;   PLACEMENT OF BREAST IMPLANTS     Patient Active Problem List   Diagnosis Date Noted   Pes planus    Arthritis of left subtalar joint    Acquired hallux valgus of left foot     Status post left foot surgery 06/29/2021   History of tobacco abuse 06/29/2021   Recovering alcoholic ([place]) 19/41/7408   Spider veins 02/09/2020   Obesity (BMI 30-39.9) 07/31/2018   Estrogen deficiency 07/31/2018   Prediabetes 05/29/2017   Anxiety and depression 05/29/2017   Screening for cervical cancer 05/29/2017   DVT prophylaxis 05/29/2017   Osteoporosis without current pathological fracture 03/12/2016    REFERRING DIAG: Criselda Peaches, DPM  THERAPY DIAG:  Difficulty in walking, not elsewhere classified  PERTINENT HISTORY: Osteoporosis  PRECAUTIONS: TMT joint only partially fused  WEIGHT BEARING RESTRICTIONS Yes currently in CAM boot for w/b  SUBJECTIVE:   Pt reports that her pain is improved and she feels she might be ready for D/C  PAIN:  Are you having pain? No Pain location: L foot, diffuse NPRS scale:  current 2/10  Pain description: intermittent, sharp, and aching Stage: Subacute Stability: getting better 24 hour pattern: no clear pattern    OBJECTIVE: (objective measures completed at initial evaluation unless otherwise dated)   OBJECTIVE:              GENERAL OBSERVATION/GAIT:                     Slow gait with L CAM BOOT   SENSATION:  Light touch: Appears intact   PALPATION: TTP dorsum on L foot, over incisions   LE MMT:   MMT Right 09/12/2021 Left 09/12/2021  Hip flexion (L2, L3) 4 4  Knee extension (L3) 4 3+  Knee flexion 4 3+  Hip abduction 3+ 3  Hip extension      Hip external rotation      Hip internal rotation      Hip adduction      Ankle dorsiflexion (L4)      Ankle plantarflexion (S1)      Ankle inversion      Ankle eversion      Great Toe ext (L5)      Grossly        (Blank rows = not tested, score listed is out of 5 possible points.  N = WNL, D = diminished, C = clear for gross weakness with myotome testing, * = concordant pain with testing)   LE ROM:   ROM Right 09/12/2021 Left 09/12/2021  Hip flexion       Hip extension      Hip abduction      Hip adduction      Hip internal rotation      Hip external rotation      Knee flexion      Knee extension      Ankle dorsiflexion 25 Lacking 5  Ankle plantarflexion 50 45  Ankle inversion 35 15  Ankle eversion 25 5    (Blank rows = not tested, N = WNL, * = concordant pain with testing)   PATIENT SURVEYS:  FOTO 43 -> 60       HOME EXERCISE PROGRAM: Access Code: [email or web address] URL: [email or web address] Date: 10/17/2021 Prepared by: Shearon Balo  Exercises - Seated Ankle Alphabet  - 3 x daily - 7 x weekly - 3 sets - 10 reps - Towel Scrunches  - 3 x daily - 7 x weekly - 3 sets - 10 reps - Ankle Inversion Eversion Towel Slide  - 3 x daily - 7 x weekly - 3 sets - 10 reps - Long Sitting Ankle Dorsiflexion with Anchored Resistance  - 1 x daily - 7 x weekly - 3 sets - 10 reps - Long Sitting Ankle Inversion with Resistance  - 1 x daily - 7 x weekly - 3 sets - 10 reps - Long Sitting Ankle Plantar Flexion with Resistance  - 1 x daily - 7 x weekly - 3 sets - 10 reps - Long Sitting Ankle Eversion with Resistance  - 1 x daily - 7 x weekly - 3 sets - 10 reps - Seated Self Great Toe Stretch  - 1 x daily - 7 x weekly - 3 sets - 10 reps - Walking March  - 1 x daily - 7 x weekly - 3 sets - 10 reps   ASTERISK SIGNS     Asterisk Signs Eval (09/12/2021) 5/24           DF/ankle ROM Lacking 5 DF            LE MMT <=3+ hip abd, knee             Balance    45'' on foam                                            TODAY'S TREATMENT:  [facility] Adult PT  Treatment:                                                DATE: 10/24/2021 Therapeutic Exercise: Bike L 4 x 5 mins  Therapeutic Activity - collecting information for goals, checking progress, and reviewing with patient  [facility] Adult PT Treatment:                                                DATE: 10/17/2021 Therapeutic Exercise: Bike L 4 x 5 mins Slant board stretch 3x 45'' Standing heel raise  - 3x15 Hurdle walking in // (NT) Knee ext machine 3x10 at 25# Knee flexion machine 3x10 at 35# Lateral walking with GTB - 4 laps  Neuromuscular re-ed: Tandem on foam 45'' bouts SLS 45'' bouts with single finger support Wooden rocker board DF/PF - 2x20  [facility] Adult PT Treatment:                                                DATE: 10/17/2021 Therapeutic Exercise: Bike L 4 x 5 mins Slant board stretch 3x 45'' Leg press heel raise - 10# - 3x15 Tib anterior raise on leg press - 3x15 Hurdle walking in //  Knee ext machine 2x10 at 25# Knee flexion machine 2x10 at 35#  Neuromuscular re-ed: Tandem on foam 45'' bouts Rocker board DF/PF - 2x20  [facility] Adult PT Treatment:                                                DATE: 10/12/2021 Therapeutic Exercise: Bike L 4 x 5 mins Leg press heel raise - 10# - 3x10 Tib anterior raise on leg press - 2x10 Ankle 4 way - GTB - 2x20 ea BAPS board L3 - PF/DF, circles CW/CCW (out of brace and shoe) Knee ext machine 2x10 at 25# Knee flexion machine 2x10 at 25#  Neuromuscular re-ed: Semi tandem (75%) on foam 45'' bouts Rocker board DF/PF - 2x20  [facility] Adult PT Treatment:                                                DATE: 10/03/2021 Therapeutic Exercise: Bike no resistance x 5 mins Slant board stretch 45'' x3 Heel raise 2x20 Tib anterior raise - 2x10 SLR - 3x10 - 2# S/L hip abduction - 3x10 BAPS board L3 - PF/DF, circles CW/CCW (out of brace and shoe) Knee ext machine 2x10 at 25# Knee flexion machine 2x10 at 25#  Neuromuscular re-ed: Romberg stance on foam x45" EO (D/C) Semi tandem (50%) on foam 45'' bouts Rocker board DF/PF - 2x20  [facility] Adult PT Treatment:  DATE: 09/29/2021 Therapeutic Exercise: Bike no resistance x 5 mins SLR - 3x10 - 2# S/L hip abduction - 3x10 BAPS board L3 - PF/DF, circles CW/CCW (out of brace and shoe) Knee ext machine 2x10 at 10# Knee flexion machine 2x10 at 20# Omega  leg press 2x10 25# BIL Toe yoga x1' Neuromuscular re-ed: Romberg stance EO/EC 30" each Tandem stance x30" BIL Romberg stance on foam x30" EO   [facility] Adult PT Treatment:                                                DATE: 09/22/2021 Therapeutic Exercise: Bike no resistance - 4 ' Towel scrunches 2x1' SLR - 3x10 - 2# S/L hip abduction - 3x10 Prone SLR - 3x10 BAPS board L3 - fwd and lateral Knee ext machine 2x10 at 10# Knee flexion machine 2x10 at 20#  Manual Therapy Gentle PROM for GT ext  [facility] Adult PT Treatment:                                                DATE: 09/19/2021 Therapeutic Exercise: Ankle ABC's x2 Towel scrunches 2x1' Towel inv/ev slide x1' each Seated calf stretch 2x30" Lt Seated clamshells GTB 2x10 Seated marching 2x10 BIL Standing hip abduction 2x10 BIL LAQ 2x10 BIL Seated hamstring curls GTB 2x10 BIL Neuromuscular re-ed: Romberg stance EO/EC 30" each Tandem stance x30" BIL         ASSESSMENT:   CLINICAL IMPRESSION: Mascotte has progressed well with therapy.  Improved impairments include: ankle ROM, strength, balance, gait.  Functional improvements include: transfers and ability to ambulate in community with reduced pain.  Progressions needed include: continued work at home with HEP for balance, normalizing gait (avoiding excessive toe out), and strengthening ankle and LE.  Barriers to progress include: NA.  Harris has done well.  She is pleased with her progress and would like to work on her exercises at home.  She can return in the next 2-3 weeks if she has any questions; I let her know that she can continue therapy in this timeframe if she would like.  Please see GOALS section for progress on short term and long term goals established at evaluation.   OBJECTIVE IMPAIRMENTS: Pain, ankle and GT ROM, gait, balance, LE strength   ACTIVITY LIMITATIONS: walking, standing, housework   PERSONAL FACTORS: See medical history and pertinent history      REHAB POTENTIAL: Good   CLINICAL DECISION MAKING: Stable/uncomplicated   EVALUATION COMPLEXITY: Low     GOALS:     SHORT TERM GOALS:   Meta will be >75% HEP compliant to improve carryover between sessions and facilitate independent management of condition   Evaluation (09/12/2021): ongoing Target date: 10/03/2021 Goal status: paritally met 5/10     LONG TERM GOALS:   Latecia will improve FOTO score to 43 as a proxy for functional improvement   Evaluation/Baseline (09/12/2021): 60 Target date: 11/07/2021 5/31: 57 Goal status: partially met     2.  Verle will improve left ankle DF to 10 degrees    Evaluation/Baseline (09/12/2021): lacking 5 degrees degrees Target date: 11/07/2021 5/31: 10 degrees w/ OP Goal status: MET     3.  Haydin will be able to  stand for >30'' in tandem stance, to show a significant improvement in balance in order to reduce fall risk    Evaluation/Baseline (09/12/2021): unable Target date: 11/07/2021 5/31: semi tandem Goal status: Partially met     4.  Avo will self report >/= 50% decrease in pain from evaluation    Evaluation/Baseline (09/12/2021): 5/10 max pain Target date: 11/07/2021 5/31: 2/10 Goal status: MET     5.  Kierrah will improve the following MMTs to >/= 4/5 to show improvement in strength:  hip abduction, knee ext, knee flexion    Evaluation/Baseline (09/12/2021): see chart in note Target date: 11/07/2021 5/31: 4+/5 for all but hip abduction at 4/5 Goal status: MET       PLAN: PT FREQUENCY: 1-2x/week   PT DURATION: 8 weeks (Ending 11/07/2021)   PLANNED INTERVENTIONS: Therapeutic exercises, Aquatic therapy, Therapeutic activity, Neuro Muscular re-education, Gait training, Patient/Family education, Joint mobilization, Dry Needling, Electrical stimulation, Spinal mobilization and/or manipulation, Moist heat, Taping, Vasopneumatic device, Ionotophoresis 69m/ml Dexamethasone, and Manual therapy   PLAN FOR NEXT SESSION: progressive  balance, strength, gait    KMathis Dad PT 10/24/2021, 12:43 PM

## 2021-11-20 ENCOUNTER — Ambulatory Visit (INDEPENDENT_AMBULATORY_CARE_PROVIDER_SITE_OTHER): Payer: PPO

## 2021-11-20 ENCOUNTER — Ambulatory Visit (INDEPENDENT_AMBULATORY_CARE_PROVIDER_SITE_OTHER): Payer: PPO | Admitting: Podiatry

## 2021-11-20 DIAGNOSIS — M2142 Flat foot [pes planus] (acquired), left foot: Secondary | ICD-10-CM | POA: Diagnosis not present

## 2021-11-20 DIAGNOSIS — M2012 Hallux valgus (acquired), left foot: Secondary | ICD-10-CM | POA: Diagnosis not present

## 2021-11-20 DIAGNOSIS — M21612 Bunion of left foot: Secondary | ICD-10-CM

## 2021-11-20 DIAGNOSIS — M960 Pseudarthrosis after fusion or arthrodesis: Secondary | ICD-10-CM

## 2021-11-23 ENCOUNTER — Encounter: Payer: Self-pay | Admitting: Internal Medicine

## 2021-11-24 NOTE — Progress Notes (Signed)
  Subjective:  Patient ID: Stacy Decker, female    DOB: November 03, 1950,  MRN: [id number]  Chief Complaint  Patient presents with   Routine Post Op     (xray)POV #4 DOS 06/29/2021 LT FOOT CALF MUSCLE LENGTHENING, REARFOOT FUSION, MIDFOOT FUSION OR OSTEOTOMY, BONE GRAFTING FROM LEG     71 y.o. female returns for post-op check.  She is return to regular shoe gear.  Occasional swelling still noted.  Trying to get used to walking and building strength again  Review of Systems: Negative except as noted in the HPI. Denies N/V/F/Ch.   Objective:  There were no vitals filed for this visit. There is no height or weight on file to calculate BMI. Constitutional Well developed. Well nourished.  Vascular Foot warm and well perfused. Capillary refill normal to all digits.  Calf is soft and supple, no posterior calf or knee pain, negative Homans' sign  Neurologic Normal speech. Oriented to person, place, and time. Epicritic sensation to light touch grossly present bilaterally.  Dermatologic Incision well-healed and not hypertrophic  Orthopedic: Little edema or tenderness to exam there is some warmth around the fusion sites   Multiple view plain film radiographs: Overall improved consolidation across arthrodesis site since last visits, there is still some lucency noted in the dorsomedial portions of the Lapidus and talonavicular sites Assessment:   1. Hallux valgus with bunions, left   2. Pseudarthrosis after fusion or arthrodesis   3. Acquired pes planus, left    Plan:  Patient was evaluated and treated and all questions answered.  S/p foot surgery left Overall continues to make good progress.  She is not having the pain she was having before surgery.  She does still have some heel eversion.  I expect she likely will need supportive shoes and orthoses long-term.  I would like to evaluate her bone healing if she still having occasional swelling and there is no complete consolidation across the  arthrodesis sites.  I have ordered a CT scan.  If there is not complete fusion we will consider noninvasive bone stimulation.  Return in about 3 months (around 02/20/2022) for surgery follow up ( new xrays).

## 2021-12-10 ENCOUNTER — Encounter: Payer: Self-pay | Admitting: Podiatry

## 2021-12-11 ENCOUNTER — Ambulatory Visit (INDEPENDENT_AMBULATORY_CARE_PROVIDER_SITE_OTHER): Payer: PPO | Admitting: Podiatry

## 2021-12-11 DIAGNOSIS — L03116 Cellulitis of left lower limb: Secondary | ICD-10-CM | POA: Diagnosis not present

## 2021-12-11 MED ORDER — CEPHALEXIN 500 MG PO CAPS: 500.0000 mg | ORAL_CAPSULE | Freq: Three times a day (TID) | ORAL | 0 refills | Status: AC

## 2021-12-11 NOTE — Progress Notes (Signed)
  Subjective:  Patient ID: Stacy Decker, female    DOB: 1950-10-25,  MRN: [id number]  Chief Complaint  Patient presents with   Bunions    L foot redness     71 y.o. female returns for post-op check.  She returns today for an urgent visit due to redness in the leg, she says has been there for maybe about a week.  There is not any pain.  Some swelling but seems to be mostly localized to the foot like she has been having  Review of Systems: Negative except as noted in the HPI. Denies N/V/F/Ch.   Objective:  There were no vitals filed for this visit. There is no height or weight on file to calculate BMI. Constitutional Well developed. Well nourished.  Vascular Foot warm and well perfused. Capillary refill normal to all digits.  Calf is soft and supple, no posterior calf or knee pain, negative Homans' sign.  No swelling in the calf  Neurologic Normal speech. Oriented to person, place, and time. Epicritic sensation to light touch grossly present bilaterally.  Dermatologic There is mild erythema in the mid calf.  There is a small skin abrasion or possible bug bite lower medial leg  Orthopedic: Some edema in the hindfoot, nearly no swelling in the calf itself   Multiple view plain film radiographs: Overall improved consolidation across arthrodesis site since last visits, there is still some lucency noted in the dorsomedial portions of the Lapidus and talonavicular sites Assessment:   1. Cellulitis of left leg     Plan:  Patient was evaluated and treated and all questions answered.  S/p foot surgery left Appears to have developed a mild cellulitis, there is a small abrasion or possible bug bite in the medial left lower leg.  Do not think this is related necessarily to her surgical site.  There was no pain and very little swelling in the leg itself, I do not think this is a DVT or blood clot.  We discussed the risk factors of this and signs and symptoms and she will let me know if  these develop and we will order a stat ultrasound.  For now we will treat empirically for cellulitis with Keflex 3 times daily x7 days  No follow-ups on file.

## 2021-12-12 DIAGNOSIS — F331 Major depressive disorder, recurrent, moderate: Secondary | ICD-10-CM | POA: Diagnosis not present

## 2021-12-12 DIAGNOSIS — F411 Generalized anxiety disorder: Secondary | ICD-10-CM | POA: Diagnosis not present

## 2021-12-20 ENCOUNTER — Ambulatory Visit
Admission: RE | Admit: 2021-12-20 | Discharge: 2021-12-20 | Disposition: A | Discharge status: Home / Self Care | Payer: PPO | Source: Ambulatory Visit | Attending: Podiatry | Admitting: Podiatry

## 2021-12-20 DIAGNOSIS — M960 Pseudarthrosis after fusion or arthrodesis: Secondary | ICD-10-CM

## 2021-12-20 DIAGNOSIS — Z981 Arthrodesis status: Secondary | ICD-10-CM | POA: Diagnosis not present

## 2021-12-20 DIAGNOSIS — M19072 Primary osteoarthritis, left ankle and foot: Secondary | ICD-10-CM | POA: Diagnosis not present

## 2021-12-24 ENCOUNTER — Encounter: Payer: Self-pay | Admitting: Podiatry

## 2021-12-27 ENCOUNTER — Telehealth: Payer: Self-pay

## 2021-12-27 MED ORDER — METHYLPREDNISOLONE 4 MG PO TBPK: ORAL_TABLET | ORAL | 0 refills | Status: DC

## 2021-12-27 NOTE — Telephone Encounter (Signed)
Spoke with patient, calf is still red and swollen, there is no pain calf pain or tightness.  No nausea vomiting shortness of breath or chest pain.  No rash or broken skin.  Discussed the inflammatory spots she may be having and recommended a methylprednisolone taper.  This was sent to pharmacy.  She will me know next week how she is doing

## 2022-01-01 DIAGNOSIS — M960 Pseudarthrosis after fusion or arthrodesis: Secondary | ICD-10-CM | POA: Diagnosis not present

## 2022-01-03 ENCOUNTER — Encounter: Payer: Self-pay | Admitting: Podiatry

## 2022-01-03 ENCOUNTER — Encounter: Payer: Self-pay | Admitting: Family Medicine

## 2022-01-03 ENCOUNTER — Other Ambulatory Visit: Payer: Self-pay

## 2022-01-03 ENCOUNTER — Ambulatory Visit (INDEPENDENT_AMBULATORY_CARE_PROVIDER_SITE_OTHER): Payer: PPO | Admitting: Family Medicine

## 2022-01-03 VITALS — BP 130/70 | HR 70 | Wt 148.0 lb

## 2022-01-03 DIAGNOSIS — S60511A Abrasion of right hand, initial encounter: Secondary | ICD-10-CM | POA: Diagnosis not present

## 2022-01-03 DIAGNOSIS — R32 Unspecified urinary incontinence: Secondary | ICD-10-CM

## 2022-01-03 LAB — POCT URINALYSIS DIP (PROADVANTAGE DEVICE)
Glucose, UA: NEGATIVE mg/dL
Ketones, POC UA: NEGATIVE mg/dL
Nitrite, UA: NEGATIVE
Protein Ur, POC: NEGATIVE mg/dL
Specific Gravity, Urine: 1.02
Urobilinogen, Ur: 0.2
pH, UA: 6 (ref 5.0–8.0)

## 2022-01-03 NOTE — Patient Instructions (Signed)
  Try and drink more fluids during the day, limiting your fluid intake after dinner. The more you drink in the evenings, the more problems you will have at night (with frequent trips to the bathroom, and more leakage). Caffeine will make this worse (go more frequently). Try and limit your caffeine, just in the morning, small amounts. Try and void more frequently (every 2 hours or more), not waiting for the urge. Do the Kegel's exercises regularly as we discussed.  We are sending your urine for culture to rule out infection.  Medications can be an option if behavioral measures aren't helping enough. Wearing Poise pads at night (vs Depends) can help prevent the frequent laundering of underwear/pajamas/sheets). The medications include Lisbeth Ply, Detrol, Vesicare, Myrbetriq.

## 2022-01-03 NOTE — Progress Notes (Signed)
Chief Complaint  Patient presents with   Acute Visit    Pt has been having bladder incontinence more frequently.   Works as a Research scientist (physical sciences).  Can go all afternoon without going to the bathroom. The other day while at the grocery store, needed to void, was trying to wait to get home, and noticed urine trickling down her leg. She wakes up and dribbles on the way to the bathroom. Has some leakage when out shopping. She is up 3x/night to void lately (usually 1-2). Not leaking every time.  Denies leakage with cough/sneeze.  No dysuria, no odor, not cloudy, no hematuria or abdominal pain.  [name] coming into the office today.  She tripped on the sidewalk. Scraped her R hand and R knee  PMH, PSH, SH reviewed  Outpatient Encounter Medications as of 01/03/2022  Medication Sig Note   buPROPion (WELLBUTRIN) 100 MG tablet Take 100 mg by mouth every morning.    Calcium Carb-Cholecalciferol (CALCIUM 1000 + D) 1000-20 MG-MCG TABS     citalopram (CELEXA) 20 MG tablet Take 30 mg by mouth at bedtime. 01/03/2022: Pt takes 30 MG   glucosamine-chondroitin 500-400 MG tablet Take 1 tablet by mouth daily.    methylPREDNISolone (MEDROL DOSEPAK) 4 MG TBPK tablet 6 day dose pack - take as directed    Multiple Vitamin (MULTIVITAMIN) tablet Take 1 tablet by mouth daily.    citalopram (CELEXA) 40 MG tablet Take 40 mg by mouth every evening. (Patient not taking: Reported on 01/03/2022)    [DISCONTINUED] buPROPion (WELLBUTRIN XL) 150 MG 24 hr tablet Take 150 mg by mouth every morning.    [DISCONTINUED] busPIRone (BUSPAR) 7.5 MG tablet Take 7.5 mg by mouth 2 (two) times daily as needed (anxiety).    [DISCONTINUED] cholecalciferol (VITAMIN D) 1000 units tablet Take 1,000 Units by mouth daily.    [DISCONTINUED] gabapentin (NEURONTIN) 300 MG capsule Take 1 capsule (300 mg total) by mouth 3 (three) times daily.    No facility-administered encounter medications on file as of 01/03/2022.   No Known Allergies  ROS: no fever,  chills, URI symptoms, chest pain, shortness of breath. Urinary complaints per HPI.  No flank pain. Ongoing issues with her L foot (s/p surgery). No rashes, bleeding.    PHYSICAL EXAM:  BP 130/70   Pulse 70   Wt 148 lb (67.1 kg)   SpO2 97%   BMI 28.90 kg/m   Pleasant female in no distress HEENT: conjunctiva and sclera are clear, EOMI Skin: Minimal abrasion to the R knee (minimal break in skin, mostly just mild erythema). R hand--ulnar aspect of palm (along base of the 5th MCP) there is a 1cm area of redness. There is a small central skin tear, with slight purplish discoloration, 3-4 mm in size.  No active bleeding. Abdomen: soft, nontender Back: no CVA tenderness  Urine: SG 1.020, mod blood, trace leuks   ASSESSMENT/PLAN:  Urinary incontinence, unspecified type - mainly urge incontinence. Counseled re: behavioral techniques. Briefly discussed meds if not improving. culture to r/o infection - Plan: Urine Culture  Abrasion of right hand, initial encounter - small skin tear. No active bleeding. Bacitracin and bandage applied after being cleaned.  Ddx of urinary symptoms discussed--infection, spasms/OAB, weakened pelvic muscles contributing (despite no significant stress incontinence history). Given findings on ua, sending culture to r/o infection. She is drinking coffee in the evenings (at [employer]). Discussed increasing fluids during the day, limiting in the evenings, and no caffeine later in the day. Briefly discussed OAB meds, if  not improving with behavioral measures. Taught how to do Kegels.  I spent 30 minutes dedicated to the care of this patient, including pre-visit review of records, face to face time, post-visit ordering of testing and documentation.  Try and drink more fluids during the day, limiting your fluid intake after dinner. The more you drink in the evenings, the more problems you will have at night (with frequent trips to the bathroom, and more  leakage). Caffeine will make this worse (go more frequently). Try and limit your caffeine, just in the morning, small amounts. Try and void more frequently (every 2 hours or more), not waiting for the urge. Do the Kegel's exercises regularly as we discussed.  We are sending your urine for culture to rule out infection.  Medications can be an option if behavioral measures aren't helping enough. Wearing Poise pads at night (vs Depends) can help prevent the frequent laundering of underwear/pajamas/sheets). The medications include Lisbeth Ply, Detrol, Vesicare, Myrbetriq.

## 2022-01-04 LAB — URINE CULTURE: Organism ID, Bacteria: NO GROWTH

## 2022-01-30 ENCOUNTER — Encounter: Payer: Self-pay | Admitting: Internal Medicine

## 2022-02-21 ENCOUNTER — Ambulatory Visit (INDEPENDENT_AMBULATORY_CARE_PROVIDER_SITE_OTHER): Payer: PPO

## 2022-02-21 ENCOUNTER — Ambulatory Visit: Payer: PPO | Admitting: Podiatry

## 2022-02-21 DIAGNOSIS — M960 Pseudarthrosis after fusion or arthrodesis: Secondary | ICD-10-CM

## 2022-02-21 DIAGNOSIS — M21612 Bunion of left foot: Secondary | ICD-10-CM

## 2022-02-21 DIAGNOSIS — M2012 Hallux valgus (acquired), left foot: Secondary | ICD-10-CM

## 2022-02-23 NOTE — Progress Notes (Signed)
  Subjective:  Patient ID: Stacy Decker, female    DOB: 1951-04-18,  MRN: [id number]  Chief Complaint  Patient presents with   Routine Post Op    POV #5 DOS 06/29/2021 LT FOOT CALF MUSCLE LENGTHENING, REARFOOT FUSION, MIDFOOT FUSION OR OSTEOTOMY, BONE GRAFTING FROM LEG     71 y.o. female returns for post-op check. Doing much better feels it has really turned a corner. The bone stimulator has helped quite a bit and she has used it consistently.   Review of Systems: Negative except as noted in the HPI. Denies N/V/F/Ch.   Objective:  There were no vitals filed for this visit. There is no height or weight on file to calculate BMI. Constitutional Well developed. Well nourished.  Vascular Foot warm and well perfused. Capillary refill normal to all digits.  Calf is soft and supple, no posterior calf or knee pain, negative Homans' sign.  No swelling in the calf  Neurologic Normal speech. Oriented to person, place, and time. Epicritic sensation to light touch grossly present bilaterally.  Dermatologic Incisions well healed and non hypertrophic  Orthopedic: No edema in foot, minimal pain, still slight valgus positioning, no pain   Multiple view plain film radiographs: new films today show maintained alignment and correction, some progressive increase in fusion sites, no complication of hardware   CT 12/20/21 IMPRESSION: Postsurgical changes of subtalar, talonavicular, and first TMT arthrodesis. Hardware is intact.   Progressive bony fusion across the posterior subtalar joint along the medial aspect.   3 mm bony bridging across the dorsal aspect of the talonavicular joint, the majority of the joint is unfused this time.   Bony fusion along the central aspect of the first TMT joint.   Mild lucency along the proximal distal tips of a retrograde talonavicular screw.   Moderate first MTP and mild-to-moderate diffuse interphalangeal joint osteoarthritis.     Electronically  Signed   By: Maurine Simmering M.D.   On: 12/20/2021 13:34  Assessment:   1. Pseudarthrosis after fusion or arthrodesis   2. Hallux valgus with bunions, left     Plan:  Patient was evaluated and treated and all questions answered.  S/p foot surgery left Overall doing much better. We reviewed her CT scan results and she has had delayed union of the TN fusion. We also discussed that the Lapidus screw is slightly long and we can remove this in the future. For now she will continue her bone stimulator until she has further union. May continue regular shoe gear and activity as tolerated  Return in about 4 months (around 07/02/2022) for follow up left foot surgery (new xrays).

## 2022-03-05 ENCOUNTER — Encounter: Payer: Self-pay | Admitting: Internal Medicine

## 2022-04-09 ENCOUNTER — Encounter: Payer: Self-pay | Admitting: Internal Medicine

## 2022-07-02 ENCOUNTER — Ambulatory Visit (INDEPENDENT_AMBULATORY_CARE_PROVIDER_SITE_OTHER): Payer: PPO | Admitting: Podiatry

## 2022-07-02 ENCOUNTER — Ambulatory Visit (INDEPENDENT_AMBULATORY_CARE_PROVIDER_SITE_OTHER): Payer: PPO

## 2022-07-02 DIAGNOSIS — M960 Pseudarthrosis after fusion or arthrodesis: Secondary | ICD-10-CM | POA: Diagnosis not present

## 2022-07-02 DIAGNOSIS — M2142 Flat foot [pes planus] (acquired), left foot: Secondary | ICD-10-CM

## 2022-07-02 DIAGNOSIS — M2012 Hallux valgus (acquired), left foot: Secondary | ICD-10-CM | POA: Diagnosis not present

## 2022-07-02 DIAGNOSIS — M21612 Bunion of left foot: Secondary | ICD-10-CM

## 2022-07-03 ENCOUNTER — Encounter: Payer: Self-pay | Admitting: Podiatry

## 2022-07-03 NOTE — Progress Notes (Signed)
  Subjective:  Patient ID: Stacy Decker, female    DOB: 29-Jun-1950,  MRN: [id number]  Chief Complaint  Patient presents with   Bunions    follow up foot surgery (new xrays) left foot     72 y.o. female returns for post-op check.  She will think she is walking better.  Says the foot feels stiff but does not hurt  Review of Systems: Negative except as noted in the HPI. Denies N/V/F/Ch.   Objective:  There were no vitals filed for this visit. There is no height or weight on file to calculate BMI. Constitutional Well developed. Well nourished.  Vascular Foot warm and well perfused. Capillary refill normal to all digits.  Calf is soft and supple, no posterior calf or knee pain, negative Homans' sign.  No swelling in the calf  Neurologic Normal speech. Oriented to person, place, and time. Epicritic sensation to light touch grossly present bilaterally.  Dermatologic Incisions well healed and non hypertrophic  Orthopedic: No edema in foot, still slight valgus positioning on weightbearing, she has no pain to palpation of the subtalar joint talonavicular joint or midfoot   Multiple view plain film radiographs: new films today show maintained alignment and correction, still some lucency present at TN joint no complication of hardware   CT 12/20/21 IMPRESSION: Postsurgical changes of subtalar, talonavicular, and first TMT arthrodesis. Hardware is intact.   Progressive bony fusion across the posterior subtalar joint along the medial aspect.   3 mm bony bridging across the dorsal aspect of the talonavicular joint, the majority of the joint is unfused this time.   Bony fusion along the central aspect of the first TMT joint.   Mild lucency along the proximal distal tips of a retrograde talonavicular screw.   Moderate first MTP and mild-to-moderate diffuse interphalangeal joint osteoarthritis.     Electronically Signed   By: Maurine Simmering M.D.   On: 12/20/2021  13:34  Assessment:   1. Hallux valgus with bunions, left   2. Pseudarthrosis after fusion or arthrodesis   3. Acquired pes planus, left     Plan:  Patient was evaluated and treated and all questions answered.  S/p foot surgery left Clinically continues to do well.  Her bone stimulation has improved some of the fusion at the TN joint but there overall is an asymptomatic nonunion here.  There is no complication of hardware or loss of correction.  I discussed with her do not think revision of this to achieve full fusion will be necessary at this point.  She is comfortable ambulating in regular shoe gear.  I did recommend using an OTC insert to support the midfoot and arch which she will do.  May continue full activity as tolerated.  I will see her back as needed at this point if any issues develop, we discussed that stiffness is normal due to the nature of the multiple midfoot and hindfoot fusions, but pain and swelling and major functional limitation would be abnormal at this point  Return if symptoms worsen or fail to improve.

## 2022-07-12 ENCOUNTER — Encounter: Payer: Self-pay | Admitting: Nurse Practitioner

## 2022-07-12 ENCOUNTER — Ambulatory Visit (INDEPENDENT_AMBULATORY_CARE_PROVIDER_SITE_OTHER): Payer: PPO | Admitting: Nurse Practitioner

## 2022-07-12 VITALS — BP 124/80 | HR 82 | Ht 60.0 in | Wt 158.6 lb

## 2022-07-12 DIAGNOSIS — R7303 Prediabetes: Secondary | ICD-10-CM

## 2022-07-12 DIAGNOSIS — Z0000 Encounter for general adult medical examination without abnormal findings: Secondary | ICD-10-CM | POA: Diagnosis not present

## 2022-07-12 DIAGNOSIS — R21 Rash and other nonspecific skin eruption: Secondary | ICD-10-CM

## 2022-07-12 DIAGNOSIS — E669 Obesity, unspecified: Secondary | ICD-10-CM

## 2022-07-12 DIAGNOSIS — F1021 Alcohol dependence, in remission: Secondary | ICD-10-CM | POA: Diagnosis not present

## 2022-07-12 DIAGNOSIS — F32A Depression, unspecified: Secondary | ICD-10-CM | POA: Diagnosis not present

## 2022-07-12 DIAGNOSIS — F419 Anxiety disorder, unspecified: Secondary | ICD-10-CM

## 2022-07-12 DIAGNOSIS — M810 Age-related osteoporosis without current pathological fracture: Secondary | ICD-10-CM

## 2022-07-12 DIAGNOSIS — L989 Disorder of the skin and subcutaneous tissue, unspecified: Secondary | ICD-10-CM | POA: Insufficient documentation

## 2022-07-12 MED ORDER — CITALOPRAM HYDROBROMIDE 20 MG PO TABS: 30.0000 mg | ORAL_TABLET | Freq: Every day | ORAL | 3 refills | Status: DC

## 2022-07-12 MED ORDER — BUPROPION HCL 100 MG PO TABS: 100.0000 mg | ORAL_TABLET | Freq: Every morning | ORAL | 3 refills | Status: DC

## 2022-07-12 MED ORDER — CLOTRIMAZOLE-BETAMETHASONE 1-0.05 % EX CREA: TOPICAL_CREAM | CUTANEOUS | 3 refills | Status: DC

## 2022-07-12 NOTE — Assessment & Plan Note (Signed)
Previous history of elevated Hemoglobin A1c levels, most recent within normal range. No concerning symptoms present.  Plan:  Continue to monitor Hemoglobin A1c annually due to history of pre-diabetes.  Educate patient on lifestyle modifications to maintain normal blood glucose levels.

## 2022-07-12 NOTE — Assessment & Plan Note (Signed)
Patient reports brown spots and dry patches on skin along with pruritic rash on right posterior shoulder that comes and goes. Evaluation of these areas today shows evidence of age spots and possible eczema.  - Plan:  Advise on the use of sunscreen to prevent further pigmentation changes.  We may consider a lightening cream for age spots if these are bothersome. Recommend moisturizing cream for dry patches.  Educate on self-monitoring for changes in size, color, or texture of skin lesions. Topical cream provided for eczema

## 2022-07-12 NOTE — Assessment & Plan Note (Signed)
Patient is in recovery and actively participating in Alcoholics Anonymous (AA) meetings four times a week. She is doing very well and I commended her for her commitment and success.   Plan:  Encourage continuation of AA meetings.  No changes to current management plan.  Provide support and resources as needed.

## 2022-07-12 NOTE — Assessment & Plan Note (Signed)

## 2022-07-12 NOTE — Patient Instructions (Signed)
Thank you for choosing to spend part of your birthday with [place] and for your commitment to your health. It was a pleasure to discuss your healthcare plan. Here are the key points from our session today:  1. Continue with your current dosage of Citalopram, 20 mg taken nightly.  2. Resume taking Wellbutrin, 100 mg every morning.  3. Keep attending [place] meetings regularly to support your recovery and mental well-being.  4. Ensure you get your annual flu vaccine; recall that you had it last autumn.  5. Consider receiving an updated COVID-19 vaccine, as approximately a year has passed since your last one.  6. Monitor any changes in the skin spots, especially those on your left clavicle and right calf.  7. Continue to keep an eye on your blood pressure, which read 124/80 at your visit.  8. Focus on managing your weight, noting that your current BMI stands at 30.97.  9. Address urinary urgency or incontinence issues, and consider interventions such as pelvic floor physical therapy.  10. Apply the prescribed cream to areas affected by eczema or similar skin reactions.  11. Stay vigilant about your bone health and plan for a bone density scan next year to evaluate for osteoporosis.  Please ensure to schedule your blood donation to check your iron levels and do not hesitate to reach out to [place] with any changes in your health or concerns.  Happy Birthday once again, and I look forward to our continued partnership in maintaining and enhancing your health.  Warm regards,  Worthy Keeler, DNP, AGNP-[facility]

## 2022-07-12 NOTE — Assessment & Plan Note (Signed)
Patient's BMI is 30.97, classifying her as obese. We discussed this today and the importance of reporting this in order to ensure correct screenings were completed.  Plan:  Discuss weight management strategies, including diet and exercise.  With healing of her foot, this should become easier Consider referral to a nutritionist if needed.  Monitor BMI at follow-up visits.

## 2022-07-12 NOTE — Assessment & Plan Note (Signed)
Patient is currently taking citalopram 20 mg daily and Wellbutrin. Reports no significant improvement with Wellbutrin 150 mg and wishes to return to 100 mg. She is taking once a day. At this time no alarm symptoms. Screening negative.  Plan:  Continue citalopram 20 mg daily. 1 year script sent. Decrease Wellbutrin to 100 mg daily as per patient's preference. 1 year script sent. Monitor for efficacy and side effects.  Encourage continued counseling and participation in [employer].

## 2022-07-12 NOTE — Progress Notes (Signed)
Worthy Keeler, DNP, AGNP-[facility] [place], [place] [address] Main Office [phone]  BP 124/80   Pulse 82   Ht 5' (1.524 m)   Wt 158 lb 9.6 oz (71.9 kg)   BMI 30.97 kg/m    Subjective:    Patient ID: Stacy Decker, female    DOB: 01/18/1951, 72 y.o.   MRN: [id number]  HPI: Stacy Decker is a 72 y.o. female presenting on 07/12/2022 for comprehensive medical examination.    Stacy Decker reports taking citalopram 20 mg once daily at night and has been on this medication for an extended period. Stacy Decker mentions that she has been without Wellbutrin for some time due to running out and has been taking leftover 150 mg doses. She expresses a desire to return to the 100 mg dosage, as she has not noticed a significant difference with the 150 mg and is attempting to wean off medications following the cessation of a stressful job.  Stacy Decker also discusses her recovery from alcoholism, indicating active participation in Alcoholics Anonymous meetings approximately four times a week, which she finds therapeutic. She reports a history of self-medicating with alcohol and drugs and now finds comfort in the support and community provided by [employer].  Regarding vaccinations, Stacy Decker confirms receiving the annual flu vaccine, the RSV shot, and the double shingle shots. She inquires about the necessity of an updated COVID-19 vaccine, stating her last dose was approximately a year ago.  Stacy Decker mentions a past foot surgery in January for reconstructive purposes to create an arch, from which she has been cleared at her last check-up. She also brings up a history of pre-diabetes, with her Hemoglobin A1c levels having been in the pre-diabetes range in 2017 and 2020 but normal in subsequent years.  Stacy Decker reports a family history of diabetes and high blood pressure but states that her own blood pressure is currently within normal limits. She acknowledges being overweight with a  current BMI of 30.97 and expresses a desire to lose weight, attributing recent weight gain to decreased activity following foot surgery.  She brings attention to skin concerns, including a change in texture on her legs and arms, and darker spots on her left clavicle area, which she has observed for three to four years. Stacy Decker does not report any significant changes in size or appearance of these spots.  Stacy Decker denies any fullness in the throat or difficulty swallowing but mentions a recent minor coughing incident. She reports no tenderness upon palpation of the throat area.  Lastly, Stacy Decker discusses urinary urgency and occasional incontinence, which she has managed with thin pads and attributes to age-related weakening of pelvic floor muscles. She recalls a history of delaying urination, which may have contributed to her current symptoms. IMMUNIZATIONS:   Flu: Flu vaccine completed elsewhere this season Prevnar 13: Prevnar 13 N/A for this patient Prevnar 20: Prevnar 20 completed, documentation in chart Pneumovax 23: Pneumovax completed, documentation in chart Vac Shingrix: Shingrix completed, documentation in chart (Dose # 2/2) HPV: HPV N/A for this patient Tetanus: Tetanus completed in the last 10 years  HEALTH MAINTENANCE: Pap Smear HM Status: is not applicable for this patient Mammogram HM Status: is up to date Colon Cancer Screening HM Status: is up to date Bone Density HM Status: is up to date STI Testing HM Status: is not applicable for this patient  She reports regular vision exams q1-5y: Yes  She reports regular dental exams q 38m  Yes  The patient eats a  regular, healthy diet. She endorses exercise and/or activity of:  routinely, yes, but limited since her foot surgery.   Pertinent items are noted in HPI.  Most Recent Depression Screen:     07/12/2022    9:14 AM 07/20/2021    2:30 PM 09/21/2020    2:49 PM 02/09/2020   10:21 AM 08/09/2019    9:34 AM  Depression screen PHQ 2/9   Decreased Interest 0 0 0 0 3  Down, Depressed, Hopeless 0 0 0 0 3  PHQ - 2 Score 0 0 0 0 6  Altered sleeping   0  3  Tired, decreased energy   0  3  Change in appetite   0  0  Feeling bad or failure about yourself    0  1  Trouble concentrating   0  0  Moving slowly or fidgety/restless   0  0  Suicidal thoughts   0  0  PHQ-9 Score   0  13  Difficult doing work/chores   Not difficult at all  Not difficult at all   Most Recent Anxiety Screen:      No data to display         Most Recent Fall Screen:    07/12/2022    9:14 AM 07/20/2021    2:29 PM 09/21/2020    2:49 PM 02/09/2020   10:21 AM 08/09/2019    9:33 AM  Fall Risk   Falls in the past year? 0 0 0 0 1  Number falls in past yr: 0  0 0 1  Injury with Fall? 0  0 0 1  Risk for fall due to : No Fall Risks Impaired balance/gait;Impaired mobility;Medication side effect No Fall Risks    Follow up Falls evaluation completed Falls evaluation completed;Education provided;Falls prevention discussed Falls evaluation completed      Past medical history, surgical history, medications, allergies, family history and social history reviewed with patient today and changes made to appropriate areas of the chart.  Past Medical History:  Past Medical History:  Diagnosis Date   Acquired hallux valgus of left foot    Acquired pes planus of left foot 07/10/2017   Anxiety    Depression    History of vertigo    Osteoporosis    Pes planus    Substance abuse ([place])    recovering alcoholic-sober since [id number]   Medications:  Current Outpatient Medications on File Prior to Visit  Medication Sig   aspirin EC 81 MG tablet Take 81 mg by mouth daily. Swallow whole.   Calcium Carb-Cholecalciferol (CALCIUM 1000 + D) 1000-20 MG-MCG TABS    glucosamine-chondroitin 500-400 MG tablet Take 1 tablet by mouth daily.   Multiple Vitamin (MULTIVITAMIN) tablet Take 1 tablet by mouth daily.   No current facility-administered medications on file prior to visit.    Surgical History:  Past Surgical History:  Procedure Laterality Date   AUGMENTATION MAMMAPLASTY Bilateral    BUNIONECTOMY Left 06/29/2021   Procedure: BUNIONECTOMY;  Surgeon: Criselda Peaches, DPM;  Location: [facility] ORS;  Service: Podiatry;  Laterality: Left;  POPLITEAL AND SAPHENOUS BLOCK   carpel tunnel Bilateral    COLONOSCOPY     FLAT FOOT CORRECTION Left 06/29/2021   Procedure: FLAT FOOT CORRECTION;  Surgeon: Criselda Peaches, DPM;  Location: [facility] OR[facility];  Service: Podiatry;  Laterality: Left;   PLACEMENT OF BREAST IMPLANTS     Allergies:  No Known Allergies Family History:  Family History  Problem Relation Age of Onset  Hypertension Mother    Cancer Mother        breast in remission age 6   Diabetes Mother    Diabetes Paternal Grandmother    Dementia Father        UTIs    Other Father        brain tumor   Other Sister        hip replacements   Alcohol abuse Sister    Other Sister        breast cysts- benign   Colon cancer Neg Hx    Stomach cancer Neg Hx    Rectal cancer Neg Hx    Esophageal cancer Neg Hx    Liver cancer Neg Hx    Breast cancer Neg Hx    BRCA 1/2 Neg Hx        Objective:    BP 124/80   Pulse 82   Ht 5' (1.524 m)   Wt 158 lb 9.6 oz (71.9 kg)   BMI 30.97 kg/m   Wt Readings from Last 3 Encounters:  07/12/22 158 lb 9.6 oz (71.9 kg)  01/03/22 148 lb (67.1 kg)  07/20/21 148 lb (67.1 kg)    Physical Exam Vitals and nursing note reviewed.  Constitutional:      General: She is not in acute distress.    Appearance: Normal appearance.  HENT:     Head: Normocephalic and atraumatic.     Right Ear: Hearing, tympanic membrane, ear canal and external ear normal.     Left Ear: Hearing, tympanic membrane, ear canal and external ear normal.     Nose: Nose normal.     Right Sinus: No maxillary sinus tenderness or frontal sinus tenderness.     Left Sinus: No maxillary sinus tenderness or frontal sinus tenderness.     Mouth/Throat:     Lips: Pink.      Mouth: Mucous membranes are moist.     Pharynx: Oropharynx is clear.  Eyes:     General: Lids are normal. Vision grossly intact.     Extraocular Movements: Extraocular movements intact.     Conjunctiva/sclera: Conjunctivae normal.     Pupils: Pupils are equal, round, and reactive to light.     Funduscopic exam:    Right eye: Red reflex present.        Left eye: Red reflex present.    Visual Fields: Right eye visual fields normal and left eye visual fields normal.  Neck:     Thyroid: No thyromegaly.     Vascular: No carotid bruit.  Cardiovascular:     Rate and Rhythm: Normal rate and regular rhythm.     Chest Wall: PMI is not displaced.     Pulses: Normal pulses.          Dorsalis pedis pulses are 2+ on the right side and 2+ on the left side.       Posterior tibial pulses are 2+ on the right side and 2+ on the left side.     Heart sounds: Normal heart sounds. No murmur heard. Pulmonary:     Effort: Pulmonary effort is normal. No respiratory distress.     Breath sounds: Normal breath sounds.  Abdominal:     General: Abdomen is flat. Bowel sounds are normal. There is no distension.     Palpations: Abdomen is soft. There is no hepatomegaly, splenomegaly or mass.     Tenderness: There is no abdominal tenderness. There is no right CVA tenderness, left CVA tenderness, guarding  or rebound.  Musculoskeletal:        General: Normal range of motion.     Cervical back: Full passive range of motion without pain, normal range of motion and neck supple. No tenderness.     Right lower leg: No edema.     Left lower leg: No edema.  Feet:     Left foot:     Toenail Condition: Left toenails are normal.  Lymphadenopathy:     Cervical: No cervical adenopathy.     Upper Body:     Right upper body: No supraclavicular adenopathy.     Left upper body: No supraclavicular adenopathy.  Skin:    General: Skin is warm and dry.     Capillary Refill: Capillary refill takes less than 2 seconds.     Nails:  There is no clubbing.     Comments: Left clavicle area: Two pigmented lesions noted, one measuring 2 cm in height and 1 cm in width, and the other 1.3 cm in height and 0.5 cm in width. No signs of malignancy noted upon visual inspection.   Right calf: A skin-colored, slightly rough patch measuring 6 mm in width by 9 mm in height.  Right posterior Shoulder: Presence of small bumps consistent with a possible eczema reaction  Neurological:     General: No focal deficit present.     Mental Status: She is alert and oriented to person, place, and time.     GCS: GCS eye subscore is 4. GCS verbal subscore is 5. GCS motor subscore is 6.     Sensory: Sensation is intact.     Motor: Motor function is intact.     Coordination: Coordination is intact.     Gait: Gait is intact.     Deep Tendon Reflexes: Reflexes are normal and symmetric.  Psychiatric:        Attention and Perception: Attention normal.        Mood and Affect: Mood normal.        Speech: Speech normal.        Behavior: Behavior normal. Behavior is cooperative.        Thought Content: Thought content normal.        Cognition and Memory: Cognition and memory normal.        Judgment: Judgment normal.     Results for orders placed or performed in visit on 01/03/22  POCT Urinalysis DIP (Proadvantage Device)  Result Value Ref Range   Color, UA yellow yellow   Clarity, UA clear clear   Glucose, UA negative negative mg/dL   Bilirubin, UA small (A) negative   Ketones, POC UA negative negative mg/dL   Specific Gravity, Urine 1.020    Blood, UA moderate (A) negative   pH, UA 6.0 5.0 - 8.0   Protein Ur, POC negative negative mg/dL   Urobilinogen, Ur 0.2    Nitrite, UA Negative Negative   Leukocytes, UA Trace (A) Negative         Assessment & Plan:   Problem List Items Addressed This Visit     Prediabetes    Previous history of elevated Hemoglobin A1c levels, most recent within normal range. No concerning symptoms present.   Plan:  Continue to monitor Hemoglobin A1c annually due to history of pre-diabetes.  Educate patient on lifestyle modifications to maintain normal blood glucose levels.      Anxiety and depression    Patient is currently taking citalopram 20 mg daily and Wellbutrin. Reports no significant improvement  with Wellbutrin 150 mg and wishes to return to 100 mg. She is taking once a day. At this time no alarm symptoms. Screening negative.  Plan:  Continue citalopram 20 mg daily. 1 year script sent. Decrease Wellbutrin to 100 mg daily as per patient's preference. 1 year script sent. Monitor for efficacy and side effects.  Encourage continued counseling and participation in [employer].      Relevant Medications   buPROPion (WELLBUTRIN) 100 MG tablet   citalopram (CELEXA) 20 MG tablet   Obesity (BMI 30-39.9)    Patient's BMI is 30.97, classifying her as obese. We discussed this today and the importance of reporting this in order to ensure correct screenings were completed.  Plan:  Discuss weight management strategies, including diet and exercise.  With healing of her foot, this should become easier Consider referral to a nutritionist if needed.  Monitor BMI at follow-up visits.      Relevant Orders   CBC with Differential/Platelet   Comprehensive metabolic panel   Hemoglobin A1c   Lipid panel   VITAMIN D 25 Hydroxy (Vit-D Deficiency, Fractures)   Recovering alcoholic ([place])    Patient is in recovery and actively participating in Alcoholics Anonymous (AA) meetings four times a week. She is doing very well and I commended her for her commitment and success.   Plan:  Encourage continuation of AA meetings.  No changes to current management plan.  Provide support and resources as needed.      Rash and nonspecific skin eruption    Patient reports brown spots and dry patches on skin along with pruritic rash on right posterior shoulder that comes and goes. Evaluation of these areas today shows  evidence of age spots and possible eczema.  - Plan:  Advise on the use of sunscreen to prevent further pigmentation changes.  We may consider a lightening cream for age spots if these are bothersome. Recommend moisturizing cream for dry patches.  Educate on self-monitoring for changes in size, color, or texture of skin lesions. Topical cream provided for eczema      Relevant Medications   clotrimazole-betamethasone (LOTRISONE) cream   Encounter for annual physical exam - Primary    CPE today with no abnormalities noted on exam.  Labs pending. Will make changes as necessary based on results.  Review of HM activities and recommendations discussed and provided on AVS Anticipatory guidance, diet, and exercise recommendations provided.  Medications, allergies, and hx reviewed and updated as necessary.  Plan to f/u with CPE in 1 year or sooner for acute/chronic health needs as directed.        Relevant Orders   CBC with Differential/Platelet   Comprehensive metabolic panel   Hemoglobin A1c   Lipid panel   VITAMIN D 25 Hydroxy (Vit-D Deficiency, Fractures)   Osteoporosis without current pathological fracture   Relevant Orders   Comprehensive metabolic panel   Other Visit Diagnoses     Health care maintenance              Follow up plan: Return in about 1 year (around 07/13/2023) for CPE.  NEXT PREVENTATIVE PHYSICAL DUE IN 1 YEAR.  PATIENT COUNSELING PROVIDED FOR ALL ADULT PATIENTS:  Consume a well balanced diet low in saturated fats, cholesterol, and moderation in carbohydrates.   This can be as simple as monitoring portion sizes and cutting back on sugary beverages such as soda and juice to start with.    Daily water consumption of at least 64 ounces.  Physical activity  at least 180 minutes per week, if just starting out.   This can be as simple as taking the stairs instead of the elevator and walking 2-3 laps around the office  purposefully every day.   STD  protection, partner selection, and regular testing if high risk.  Limited consumption of alcoholic beverages if alcohol is consumed.  For women, I recommend no more than 7 alcoholic beverages per week, spread out throughout the week.  Avoid "binge" drinking or consuming large quantities of alcohol in one setting.   Please let me know if you feel you may need help with reduction or quitting alcohol consumption.   Avoidance of nicotine, if used.  Please let me know if you feel you may need help with reduction or quitting nicotine use.   Daily mental health attention.  This can be in the form of 5 minute daily meditation, prayer, journaling, yoga, reflection, etc.   Purposeful attention to your emotions and mental state can significantly improve your overall wellbeing and Health.  Please know that I am here to help you with all of your health care goals and am happy to work with you to find a solution that works best for you.  The greatest advice I have received with any changes in life are to take it one step at a time, that even means if all you can focus on is the next 60 seconds, then do that and celebrate your victories.  With any changes in life, you will have set backs, and that is OK. The important thing to remember is, if you have a set back, it is not a failure, it is an opportunity to try again!  Health Maintenance Recommendations Screening Testing Mammogram Every 1 -2 years based on history and risk factors Starting at age 63 Pap Smear Ages 21-39 every 3 years Ages 45-65 every 5 years with HPV testing More frequent testing may be required based on results and history Colon Cancer Screening Every 1-10 years based on test performed, risk factors, and history Starting at age 7 Bone Density Screening Every 2-10 years based on history Starting at age 81 for women Recommendations for men differ based on medication usage, history, and risk factors AAA Screening One time  ultrasound Men 6-21 years old who have every smoked Lung Cancer Screening Low Dose Lung CT every 12 months Age 47-80 years with a 30 pack-year smoking history who still smoke or who have quit within the last 15 years  Screening Labs Routine  Labs: Complete Blood Count (CBC), Complete Metabolic Panel (CMP), Cholesterol (Lipid Panel) Every 6-12 months based on history and medications May be recommended more frequently based on current conditions or previous results Hemoglobin A1c Lab Every 3-12 months based on history and previous results Starting at age 80 or earlier with diagnosis of diabetes, high cholesterol, BMI >26, and/or risk factors Frequent monitoring for patients with diabetes to ensure blood sugar control Thyroid Panel (TSH w/ T3 & T4) Every 6 months based on history, symptoms, and risk factors May be repeated more often if on medication HIV One time testing for all patients 12 and older May be repeated more frequently for patients with increased risk factors or exposure Hepatitis C One time testing for all patients 66 and older May be repeated more frequently for patients with increased risk factors or exposure Gonorrhea, Chlamydia Every 12 months for all sexually active persons 13-24 years Additional monitoring may be recommended for those who are considered high risk or  who have symptoms PSA Men 9-26 years old with risk factors Additional screening may be recommended from age 69-69 based on risk factors, symptoms, and history  Vaccine Recommendations Tetanus Booster All adults every 10 years Flu Vaccine All patients 6 months and older every year COVID Vaccine All patients 12 years and older Initial dosing with booster May recommend additional booster based on age and health history HPV Vaccine 2 doses all patients age 8-26 Dosing may be considered for patients over 26 Shingles Vaccine (Shingrix) 2 doses all adults 59 years and older Pneumonia (Pneumovax  23) All adults 53 years and older May recommend earlier dosing based on health history Pneumonia (Prevnar 26) All adults 9 years and older Dosed 1 year after Pneumovax 23  Additional Screening, Testing, and Vaccinations may be recommended on an individualized basis based on family history, health history, risk factors, and/or exposure.

## 2022-07-13 LAB — CBC WITH DIFFERENTIAL/PLATELET
Basophils Absolute: 0.1 10*3/uL (ref 0.0–0.2)
Basos: 1 %
EOS (ABSOLUTE): 0.4 10*3/uL (ref 0.0–0.4)
Eos: 5 %
Hematocrit: 44.3 % (ref 34.0–46.6)
Hemoglobin: 14.6 g/dL (ref 11.1–15.9)
Immature Grans (Abs): 0 10*3/uL (ref 0.0–0.1)
Immature Granulocytes: 0 %
Lymphocytes Absolute: 1.9 10*3/uL (ref 0.7–3.1)
Lymphs: 23 %
MCH: 29.6 pg (ref 26.6–33.0)
MCHC: 33 g/dL (ref 31.5–35.7)
MCV: 90 fL (ref 79–97)
Monocytes Absolute: 1 10*3/uL — ABNORMAL HIGH (ref 0.1–0.9)
Monocytes: 12 %
Neutrophils Absolute: 4.9 10*3/uL (ref 1.4–7.0)
Neutrophils: 59 %
Platelets: 300 10*3/uL (ref 150–450)
RBC: 4.93 x10E6/uL (ref 3.77–5.28)
RDW: 12.2 % (ref 11.7–15.4)
WBC: 8.3 10*3/uL (ref 3.4–10.8)

## 2022-07-13 LAB — VITAMIN D 25 HYDROXY (VIT D DEFICIENCY, FRACTURES): Vit D, 25-Hydroxy: 52.8 ng/mL (ref 30.0–100.0)

## 2022-07-13 LAB — COMPREHENSIVE METABOLIC PANEL
ALT: 11 IU/L (ref 0–32)
AST: 21 IU/L (ref 0–40)
Albumin/Globulin Ratio: 2.1 (ref 1.2–2.2)
Albumin: 4.1 g/dL (ref 3.8–4.8)
Alkaline Phosphatase: 103 IU/L (ref 44–121)
BUN/Creatinine Ratio: 15 (ref 12–28)
BUN: 11 mg/dL (ref 8–27)
Bilirubin Total: 0.2 mg/dL (ref 0.0–1.2)
CO2: 22 mmol/L (ref 20–29)
Calcium: 9.5 mg/dL (ref 8.7–10.3)
Chloride: 105 mmol/L (ref 96–106)
Creatinine, Ser: 0.72 mg/dL (ref 0.57–1.00)
Globulin, Total: 2 g/dL (ref 1.5–4.5)
Glucose: 99 mg/dL (ref 70–99)
Potassium: 4.5 mmol/L (ref 3.5–5.2)
Sodium: 142 mmol/L (ref 134–144)
Total Protein: 6.1 g/dL (ref 6.0–8.5)
eGFR: 88 mL/min/{1.73_m2} (ref >59)

## 2022-07-13 LAB — LIPID PANEL
Chol/HDL Ratio: 3.7 ratio (ref 0.0–4.4)
Cholesterol, Total: 149 mg/dL (ref 100–199)
HDL: 40 mg/dL (ref >39)
LDL Chol Calc (NIH): 85 mg/dL (ref 0–99)
Triglycerides: 137 mg/dL (ref 0–149)
VLDL Cholesterol Cal: 24 mg/dL (ref 5–40)

## 2022-07-13 LAB — HEMOGLOBIN A1C
Est. average glucose Bld gHb Est-mCnc: 120 mg/dL
Hgb A1c MFr Bld: 5.8 % — ABNORMAL HIGH (ref 4.8–5.6)

## 2022-07-26 ENCOUNTER — Ambulatory Visit (INDEPENDENT_AMBULATORY_CARE_PROVIDER_SITE_OTHER): Payer: PPO

## 2022-07-26 VITALS — Ht 60.0 in | Wt 158.0 lb

## 2022-07-26 DIAGNOSIS — Z0000 Encounter for general adult medical examination without abnormal findings: Secondary | ICD-10-CM

## 2022-07-26 NOTE — Patient Instructions (Signed)
Stacy Decker , Thank you for taking time to come for your Medicare Wellness Visit. I appreciate your ongoing commitment to your health goals. Please review the following plan we discussed and let me know if I can assist you in the future.   These are the goals we discussed:  Goals      Patient Stated     07/20/2021, wants to heal from surgery     Patient Stated     07/26/2022, wants to lose weight and start exercising        This is a list of the screening recommended for you and due dates:  Health Maintenance  Topic Date Due   COVID-19 Vaccine (5 - 2023-24 season) 01/25/2022   Medicare Annual Wellness Visit  07/26/2023   Mammogram  08/29/2023   DTaP/Tdap/Td vaccine (2 - Td or Tdap) 09/11/2023   Colon Cancer Screening  07/30/2026   Pneumonia Vaccine  Completed   Flu Shot  Completed   DEXA scan (bone density measurement)  Completed   Hepatitis C Screening: USPSTF Recommendation to screen - Ages 52-79 yo.  Completed   Zoster (Shingles) Vaccine  Completed   HPV Vaccine  Aged Out    Advanced directives: copy in chart  Conditions/risks identified: none  Next appointment: Follow up in one year for your annual wellness visit    Preventive Care 65 Years and Older, Female Preventive care refers to lifestyle choices and visits with your health care provider that can promote health and wellness. What does preventive care include? A yearly physical exam. This is also called an annual well check. Dental exams once or twice a year. Routine eye exams. Ask your health care provider how often you should have your eyes checked. Personal lifestyle choices, including: Daily care of your teeth and gums. Regular physical activity. Eating a healthy diet. Avoiding tobacco and drug use. Limiting alcohol use. Practicing safe sex. Taking low-dose aspirin every day. Taking vitamin and mineral supplements as recommended by your health care provider. What happens during an annual well check? The  services and screenings done by your health care provider during your annual well check will depend on your age, overall health, lifestyle risk factors, and family history of disease. Counseling  Your health care provider may ask you questions about your: Alcohol use. Tobacco use. Drug use. Emotional well-being. Home and relationship well-being. Sexual activity. Eating habits. History of falls. Memory and ability to understand (cognition). Work and work Statistician. Reproductive health. Screening  You may have the following tests or measurements: Height, weight, and BMI. Blood pressure. Lipid and cholesterol levels. These may be checked every 5 years, or more frequently if you are over 46 years old. Skin check. Lung cancer screening. You may have this screening every year starting at age 84 if you have a 30-pack-year history of smoking and currently smoke or have quit within the past 15 years. Fecal occult blood test (FOBT) of the stool. You may have this test every year starting at age 34. Flexible sigmoidoscopy or colonoscopy. You may have a sigmoidoscopy every 5 years or a colonoscopy every 10 years starting at age 27. Hepatitis C blood test. Hepatitis B blood test. Sexually transmitted disease (STD) testing. Diabetes screening. This is done by checking your blood sugar (glucose) after you have not eaten for a while (fasting). You may have this done every 1-3 years. Bone density scan. This is done to screen for osteoporosis. You may have this done starting at age 36. Mammogram. This  may be done every 1-2 years. Talk to your health care provider about how often you should have regular mammograms. Talk with your health care provider about your test results, treatment options, and if necessary, the need for more tests. Vaccines  Your health care provider may recommend certain vaccines, such as: Influenza vaccine. This is recommended every year. Tetanus, diphtheria, and acellular  pertussis (Tdap, Td) vaccine. You may need a Td booster every 10 years. Zoster vaccine. You may need this after age 44. Pneumococcal 13-valent conjugate (PCV13) vaccine. One dose is recommended after age 73. Pneumococcal polysaccharide (PPSV23) vaccine. One dose is recommended after age 66. Talk to your health care provider about which screenings and vaccines you need and how often you need them. This information is not intended to replace advice given to you by your health care provider. Make sure you discuss any questions you have with your health care provider. Document Released: 06/09/2015 Document Revised: 01/31/2016 Document Reviewed: 03/14/2015 Elsevier Interactive Patient Education  2017 [employer] Prevention in the Home Falls can cause injuries. They can happen to people of all ages. There are many things you can do to make your home safe and to help prevent falls. What can I do on the outside of my home? Regularly fix the edges of walkways and driveways and fix any cracks. Remove anything that might make you trip as you walk through a door, such as a raised step or threshold. Trim any bushes or trees on the path to your home. Use bright outdoor lighting. Clear any walking paths of anything that might make someone trip, such as rocks or tools. Regularly check to see if handrails are loose or broken. Make sure that both sides of any steps have handrails. Any raised decks and porches should have guardrails on the edges. Have any leaves, snow, or ice cleared regularly. Use sand or salt on walking paths during winter. Clean up any spills in your garage right away. This includes oil or grease spills. What can I do in the bathroom? Use night lights. Install grab bars by the toilet and in the tub and shower. Do not use towel bars as grab bars. Use non-skid mats or decals in the tub or shower. If you need to sit down in the shower, use a plastic, non-slip stool. Keep the floor  dry. Clean up any water that spills on the floor as soon as it happens. Remove soap buildup in the tub or shower regularly. Attach bath mats securely with double-sided non-slip rug tape. Do not have throw rugs and other things on the floor that can make you trip. What can I do in the bedroom? Use night lights. Make sure that you have a light by your bed that is easy to reach. Do not use any sheets or blankets that are too big for your bed. They should not hang down onto the floor. Have a firm chair that has side arms. You can use this for support while you get dressed. Do not have throw rugs and other things on the floor that can make you trip. What can I do in the kitchen? Clean up any spills right away. Avoid walking on wet floors. Keep items that you use a lot in easy-to-reach places. If you need to reach something above you, use a strong step stool that has a grab bar. Keep electrical cords out of the way. Do not use floor polish or wax that makes floors slippery. If you must  use wax, use non-skid floor wax. Do not have throw rugs and other things on the floor that can make you trip. What can I do with my stairs? Do not leave any items on the stairs. Make sure that there are handrails on both sides of the stairs and use them. Fix handrails that are broken or loose. Make sure that handrails are as long as the stairways. Check any carpeting to make sure that it is firmly attached to the stairs. Fix any carpet that is loose or worn. Avoid having throw rugs at the top or bottom of the stairs. If you do have throw rugs, attach them to the floor with carpet tape. Make sure that you have a light switch at the top of the stairs and the bottom of the stairs. If you do not have them, ask someone to add them for you. What else can I do to help prevent falls? Wear shoes that: Do not have high heels. Have rubber bottoms. Are comfortable and fit you well. Are closed at the toe. Do not wear  sandals. If you use a stepladder: Make sure that it is fully opened. Do not climb a closed stepladder. Make sure that both sides of the stepladder are locked into place. Ask someone to hold it for you, if possible. Clearly mark and make sure that you can see: Any grab bars or handrails. First and last steps. Where the edge of each step is. Use tools that help you move around (mobility aids) if they are needed. These include: Canes. Walkers. Scooters. Crutches. Turn on the lights when you go into a dark area. Replace any light bulbs as soon as they burn out. Set up your furniture so you have a clear path. Avoid moving your furniture around. If any of your floors are uneven, fix them. If there are any pets around you, be aware of where they are. Review your medicines with your doctor. Some medicines can make you feel dizzy. This can increase your chance of falling. Ask your doctor what other things that you can do to help prevent falls. This information is not intended to replace advice given to you by your health care provider. Make sure you discuss any questions you have with your health care provider. Document Released: 03/09/2009 Document Revised: 10/19/2015 Document Reviewed: 06/17/2014 Elsevier Interactive Patient Education  2017 [employer].

## 2022-07-26 NOTE — Progress Notes (Signed)
I connected with  Stacy Decker on 07/26/22 by a video and audio enabled telemedicine application and verified that I am speaking with the correct person using two identifiers.  Patient Location: Home  Provider Location: Office/Clinic  I discussed the limitations of evaluation and management by telemedicine. The patient expressed understanding and agreed to proceed.  Subjective:   Stacy Decker is a 72 y.o. female who presents for Medicare Annual (Subsequent) preventive examination.  Review of Systems     Cardiac Risk Factors include: advanced age (>74mn, >>60women);obesity (BMI >30kg/m2)     Objective:    Today's Vitals   07/26/22 1119  Weight: 158 lb (71.7 kg)  Height: 5' (1.524 m)   Body mass index is 30.86 kg/m.     07/26/2022   11:36 AM 09/12/2021   10:55 AM 07/20/2021    2:29 PM 06/29/2021   11:19 AM 06/20/2021   12:51 PM 02/28/2021   11:09 AM 05/29/2017    9:22 AM  Advanced Directives  Does Patient Have a Medical Advance Directive? Yes No Yes Yes Yes No Yes  Type of AParamedicof A[place]oweeLiving will  [place]ow[place]Living will [place]Living will     Does patient want to make changes to medical advance directive?    No - Patient declined   Yes (ED - Information included in AVS)  Copy of [place] Chart? Yes - validated most recent copy scanned in chart (See row information)  Yes - validated most recent copy scanned in chart (See row information) Yes - validated most recent copy scanned in chart (See row information)     Would patient like information on creating a medical advance directive?  No - Patient declined    No - Patient declined     Current Medications (verified) Outpatient Encounter Medications as of 07/26/2022  Medication Sig   aspirin EC 81 MG tablet Take 81 mg by mouth daily. Swallow whole.   buPROPion (WELLBUTRIN) 100 MG tablet Take 1 tablet (100 mg total) by mouth  every morning.   Calcium Carb-Cholecalciferol (CALCIUM 1000 + D) 1000-20 MG-MCG TABS    citalopram (CELEXA) 20 MG tablet Take 1.5 tablets (30 mg total) by mouth at bedtime.   clotrimazole-betamethasone (LOTRISONE) cream Apply a thin layer to rash up to twice a day as needed.   glucosamine-chondroitin 500-400 MG tablet Take 1 tablet by mouth daily.   Multiple Vitamin (MULTIVITAMIN) tablet Take 1 tablet by mouth daily.   No facility-administered encounter medications on file as of 07/26/2022.    Allergies (verified) Patient has no known allergies.   History: Past Medical History:  Diagnosis Date   Acquired hallux valgus of left foot    Acquired pes planus of left foot 07/10/2017   Anxiety    Depression    History of vertigo    Osteoporosis    Pes planus    Substance abuse ([place]    recovering alcoholic-sober since [id number]  Past Surgical History:  Procedure Laterality Date   AUGMENTATION MAMMAPLASTY Bilateral    BUNIONECTOMY Left 06/29/2021   Procedure: BUNIONECTOMY;  Surgeon: Stacy Decker;  Location: [facility] ORS;  Service: Podiatry;  Laterality: Left;  POPLITEAL AND SAPHENOUS BLOCK   carpel tunnel Bilateral    COLONOSCOPY     FLAT FOOT CORRECTION Left 06/29/2021   Procedure: FLAT FOOT CORRECTION;  Surgeon: Stacy Decker;  Location: [facility] ORS;  Service: Podiatry;  Laterality: Left;  PLACEMENT OF BREAST IMPLANTS     Family History  Problem Relation Age of Onset   Hypertension Mother    Cancer Mother        breast in remission age 34   Diabetes Mother    Diabetes Paternal Grandmother    Dementia Father        UTIs    Other Father        brain tumor   Other Sister        hip replacements   Alcohol abuse Sister    Other Sister        breast cysts- benign   Colon cancer Neg Hx    Stomach cancer Neg Hx    Rectal cancer Neg Hx    Esophageal cancer Neg Hx    Liver cancer Neg Hx    Breast cancer Neg Hx    BRCA 1/2 Neg Hx    Social History   Socioeconomic  History   Marital status: Divorced    Spouse name: Not on file   Number of children: 0   Years of education: Not on file   Highest education level: Not on file  Occupational History   Occupation: Therapy asst At [employer]  Tobacco Use   Smoking status: Former    Packs/day: 1.50    Years: 25.00    Total pack years: 37.50    Types: Cigarettes    Quit date: 06/28/1996    Years since quitting: 26.0    Passive exposure: Past   Smokeless tobacco: Never  Vaping Use   Vaping Use: Never used  Substance and Sexual Activity   Alcohol use: No    Alcohol/week: 0.0 standard drinks of alcohol    Comment: recovering alcoholic quit in [id number]   Drug use: No    Comment: former marijuana   Sexual activity: Not Currently    Birth control/protection: Post-menopausal  Other Topics Concern   Not on file  Social History Narrative   Not on file   Social Determinants of Health   Financial Resource Strain: Low Risk  (07/26/2022)   Overall Financial Resource Strain (CARDIA)    Difficulty of Paying Living Expenses: Not hard at all  Food Insecurity: No Food Insecurity (07/26/2022)   Hunger Vital Sign    Worried About Running Out of Food in the Last Year: Never true    Ran Out of Food in the Last Year: Never true  Transportation Needs: No Transportation Needs (07/26/2022)   PRAPARE - Hydrologist (Medical): No    Lack of Transportation (Non-Medical): No  Physical Activity: Inactive (07/26/2022)   Exercise Vital Sign    Days of Exercise per Week: 0 days    Minutes of Exercise per Session: 0 min  Stress: No Stress Concern Present (07/26/2022)   [place]    Feeling of Stress : Only a little  Social Connections: Not on file    Tobacco Counseling Counseling given: Not Answered   Clinical Intake:  Pre-visit preparation completed: Yes  Pain : No/denies pain     Nutritional Status: BMI > 30   Obese Nutritional Risks: None Diabetes: No  How often do you need to have someone help you when you read instructions, pamphlets, or other written materials from your doctor or pharmacy?: 1 - Never  Diabetic? no  Interpreter Needed?: No  Information entered by :: Stacy Decker   Activities of Daily Living  07/26/2022   11:25 AM  In your present state of health, do you have any difficulty performing the following activities:  Hearing? 0  Vision? 0  Difficulty concentrating or making decisions? 0  Walking or climbing stairs? 0  Dressing or bathing? 0  Doing errands, shopping? 0  Preparing Food and eating ? N  Using the Toilet? N  In the past six months, have you accidently leaked urine? Y  Do you have problems with loss of bowel control? N  Managing your Medications? N  Managing your Finances? N  Housekeeping or managing your Housekeeping? N    Patient Care Team: Early, Coralee Pesa, NP as PCP - General (Nurse Practitioner)  Indicate any recent Medical Services you may have received from other than [facility] providers in the past year (date may be approximate).     Assessment:   This is a routine wellness examination for Stacy Decker.  Hearing/Vision screen Vision Screening - Comments:: Regular eye exams, [employer]  Dietary issues and exercise activities discussed: Current Exercise Habits: The patient does not participate in regular exercise at present   Goals Addressed             This Visit's Progress    Patient Stated       07/26/2022, wants to lose weight and start exercising       Depression Screen    07/26/2022   11:24 AM 07/12/2022    9:14 AM 07/20/2021    2:30 PM 09/21/2020    2:49 PM 02/09/2020   10:21 AM 08/09/2019    9:34 AM 07/31/2018   11:23 AM  PHQ 2/9 Scores  PHQ - 2 Score 0 0 0 0 0 6 0  PHQ- 9 Score 2   0  13     Fall Risk    07/26/2022   11:24 AM 07/12/2022    9:14 AM 07/20/2021    2:29 PM 09/21/2020    2:49 PM 02/09/2020   10:21 AM  Fall Risk   Falls  in the past year? 0 0 0 0 0  Number falls in past yr: 0 0  0 0  Injury with Fall? 0 0  0 0  Risk for fall due to : Medication side effect No Fall Risks Impaired balance/gait;Impaired mobility;Medication side effect No Fall Risks   Follow up Falls prevention discussed;Education provided;Falls evaluation completed Falls evaluation completed Falls evaluation completed;Education provided;Falls prevention discussed Falls evaluation completed     FALL RISK PREVENTION PERTAINING TO THE HOME:  Any stairs in or around the home? Yes  If so, are there any without handrails? No  Home free of loose throw rugs in walkways, pet beds, electrical cords, etc? Yes  Adequate lighting in your home to reduce risk of falls? Yes   ASSISTIVE DEVICES UTILIZED TO PREVENT FALLS:  Life alert? No  Use of a cane, walker or w/c? No  Grab bars in the bathroom? Yes  Shower chair or bench in shower? Yes  Elevated toilet seat or a handicapped toilet? No   TIMED UP AND GO:  Was the test performed? No .      Cognitive Function:        07/26/2022   11:26 AM 05/29/2017    9:23 AM  6CIT Screen  What Year? 0 points 0 points  What month? 0 points 0 points  What time? 0 points 0 points  Count back from 20 0 points 0 points  Months in reverse 0 points 0 points  Repeat  phrase 0 points 0 points  Total Score 0 points 0 points    Immunizations Immunization History  Administered Date(s) Administered   Fluad Quad(high Dose 65+) 02/09/2020, 02/24/2022   Influenza, High Dose Seasonal PF 01/30/2016, 03/09/2018   Influenza-Unspecified 03/10/2012, 02/12/2013, 03/11/2015, 02/28/2017, 02/15/2021   PFIZER(Purple Top)SARS-COV-2 Vaccination 07/10/2019, 08/02/2019, 02/07/2020   Pfizer Covid-19 Vaccine Bivalent Booster 50yr & up 05/18/2021   Pneumococcal Conjugate-13 10/11/2014   Pneumococcal Polysaccharide-23 02/19/2016   Tdap 09/10/2013   Zoster Recombinat (Shingrix) 05/18/2021, 09/22/2021   Zoster, Live 03/10/2012     TDAP status: Up to date  Flu Vaccine status: Up to date  Pneumococcal vaccine status: Up to date  Covid-19 vaccine status: Completed vaccines  Qualifies for Shingles Vaccine? Yes   Zostavax completed Yes   Shingrix Completed?: Yes  Screening Tests Health Maintenance  Topic Date Due   COVID-19 Vaccine (5 - 2023-24 season) 01/25/2022   Medicare Annual Wellness (AWV)  07/26/2023   MAMMOGRAM  08/29/2023   DTaP/Tdap/Td (2 - Td or Tdap) 09/11/2023   COLONOSCOPY (Pts 45-429yrInsurance coverage will need to be confirmed)  07/30/2026   Pneumonia Vaccine 6568Years old  Completed   INFLUENZA VACCINE  Completed   DEXA SCAN  Completed   Hepatitis C Screening  Completed   Zoster Vaccines- Shingrix  Completed   HPV VACCINES  Aged Out    Health Maintenance  Health Maintenance Due  Topic Date Due   COVID-19 Vaccine (5 - 2023-24 season) 01/25/2022    Colorectal cancer screening: Type of screening: Colonoscopy. Completed 07/29/2016. Repeat every 10 years  Mammogram status: Completed 08/28/2021. Repeat every year  Bone Density status: Completed 04/11/2021.  Lung Cancer Screening: (Low Dose CT Chest recommended if Age 73-80ears, 30 pack-year currently smoking OR have quit w/in 15years.) does not qualify.   Lung Cancer Screening Referral: no  Additional Screening:  Hepatitis C Screening: does qualify; Completed 01/30/2016  Vision Screening: Recommended annual ophthalmology exams for early detection of glaucoma and other disorders of the eye. Is the patient up to date with their annual eye exam?  Yes  Who is the provider or what is the name of the office in which the patient attends annual eye exams? [place] is not established with a provider, would they like to be referred to a provider to establish care? No .   Dental Screening: Recommended annual dental exams for proper oral hygiene  Community Resource Referral / Chronic Care Management: CRR required this visit?   No   CCM required this visit?  No      Plan:     I have personally reviewed and noted the following in the patient's chart:   Medical and social history Use of alcohol, tobacco or illicit drugs  Current medications and supplements including opioid prescriptions. Patient is not currently taking opioid prescriptions. Functional ability and status Nutritional status Physical activity Advanced directives List of other physicians Hospitalizations, surgeries, and ER visits in previous 12 months Vitals Screenings to include cognitive, depression, and falls Referrals and appointments  In addition, I have reviewed and discussed with patient certain preventive protocols, quality metrics, and best practice recommendations. A written personalized care plan for preventive services as well as general preventive health recommendations were provided to patient.     NiKellie SimmeringLPN   3/624THL Nurse Notes: none  Due to this being a virtual visit, the after visit summary with patients personalized plan was offered to patient via mail or  my-chart. Patient would like to access on my-chart

## 2023-01-15 ENCOUNTER — Ambulatory Visit (INDEPENDENT_AMBULATORY_CARE_PROVIDER_SITE_OTHER): Payer: PPO | Admitting: Nurse Practitioner

## 2023-01-15 ENCOUNTER — Encounter: Payer: Self-pay | Admitting: Nurse Practitioner

## 2023-01-15 VITALS — BP 120/82 | HR 95 | Wt 159.0 lb

## 2023-01-15 DIAGNOSIS — E669 Obesity, unspecified: Secondary | ICD-10-CM

## 2023-01-15 DIAGNOSIS — R7303 Prediabetes: Secondary | ICD-10-CM

## 2023-01-15 DIAGNOSIS — L814 Other melanin hyperpigmentation: Secondary | ICD-10-CM | POA: Diagnosis not present

## 2023-01-15 MED ORDER — TRETINOIN 0.05 % EX CREA
TOPICAL_CREAM | Freq: Every day | CUTANEOUS | 0 refills | Status: DC
Start: 2023-01-15 — End: 2023-08-19

## 2023-01-15 NOTE — Progress Notes (Signed)
Stacy Clamp, DNP, AGNP-c [facility]  [address] [place], [place] [address] [phone]  ESTABLISHED PATIENT- Chronic Health and/or Follow-Up Visit  Blood pressure 120/82, pulse 95, weight 159 lb (72.1 kg).    Stacy Decker is a 72 y.o. year old female presenting today for evaluation and management of chronic conditions.   She is here for a med check. She tells me she is doing well at this time.  She reports that she has stayed active through playing tennis with a friend with the exception of very hot days.  She has also been monitoring her diet closely.  She denies any concerns with increased hunger, increased thirst, or increased urination.  She reports her mood is stable.  She does have concerns with some dark spots that have come up on her arms and legs and would like to know if treatment can be used for this.  She tells me she has been playing tennis with a friend, but since it turned hot she has not   All ROS negative with exception of what is listed above.   PHYSICAL EXAM Physical Exam Vitals and nursing note reviewed.  Constitutional:      Appearance: Normal appearance.  HENT:     Head: Normocephalic.  Eyes:     Pupils: Pupils are equal, round, and reactive to light.  Neck:     Vascular: No carotid bruit.  Cardiovascular:     Rate and Rhythm: Normal rate and regular rhythm.     Pulses: Normal pulses.     Heart sounds: Normal heart sounds.  Pulmonary:     Effort: Pulmonary effort is normal.     Breath sounds: Normal breath sounds.  Musculoskeletal:        General: Normal range of motion.     Cervical back: Normal range of motion.  Skin:    General: Skin is warm.     Comments: Scattered macules with no alarm symptoms present across the arms and legs.  Neurological:     General: No focal deficit present.     Mental Status: She is alert and oriented to person, place, and time.  Psychiatric:        Mood and Affect: Mood normal.      PLAN Problem List Items Addressed This Visit     Prediabetes - Primary    Prediabetes currently managed with diet and exercise.  She historically has had very good control over this.  Just recently there has been slight elevation.  We will repeat labs today for monitoring.  Recommend continuation of exercise and low carbohydrate/low-fat diet.      Relevant Orders   CBC with Differential/Platelet (Completed)   CMP14+EGFR (Completed)   Hemoglobin A1c (Completed)   Vitamin D, 25-hydroxy (Completed)   Obesity (BMI 30-39.9)    Her weight is stable at this time.  No alarm symptoms are present.  She has been working on increased activity and exercise which is fantastic!  Point I encouraged her to continue with her activities.      Relevant Orders   CBC with Differential/Platelet (Completed)   CMP14+EGFR (Completed)   Hemoglobin A1c (Completed)   Vitamin D, 25-hydroxy (Completed)   Age spots    Tan and brown-colored macules scattered on the upper and lower extremities consistent with age related changes to the skin.  No alarm symptoms are present at this time.  We will trial tretinoin to see if this is helpful in reducing visibility.  Relevant Medications   tretinoin (RETIN-A) 0.05 % cream    Return in about 6 months (around 07/18/2023) for CPE.  Time: 36 minutes, >50% spent counseling, care coordination, chart review, and documentation.   Stacy Clamp, DNP, AGNP-c

## 2023-01-15 NOTE — Patient Instructions (Signed)
Let me know if the tretinoin doesn't seem to be helping with the spots after using it for about 3 weeks. You should see a difference by then. If not, we can try something else.   Kee working on [employer] and exercise.

## 2023-01-16 LAB — CBC WITH DIFFERENTIAL/PLATELET
Basophils Absolute: 0.1 10*3/uL (ref 0.0–0.2)
Basos: 1 %
EOS (ABSOLUTE): 0.3 10*3/uL (ref 0.0–0.4)
Eos: 4 %
Hematocrit: 46.5 % (ref 34.0–46.6)
Hemoglobin: 15.2 g/dL (ref 11.1–15.9)
Immature Grans (Abs): 0 10*3/uL (ref 0.0–0.1)
Immature Granulocytes: 0 %
Lymphocytes Absolute: 1.5 10*3/uL (ref 0.7–3.1)
Lymphs: 20 %
MCH: 29.5 pg (ref 26.6–33.0)
MCHC: 32.7 g/dL (ref 31.5–35.7)
MCV: 90 fL (ref 79–97)
Monocytes Absolute: 1 10*3/uL — ABNORMAL HIGH (ref 0.1–0.9)
Monocytes: 13 %
Neutrophils Absolute: 4.8 10*3/uL (ref 1.4–7.0)
Neutrophils: 62 %
Platelets: 304 10*3/uL (ref 150–450)
RBC: 5.16 x10E6/uL (ref 3.77–5.28)
RDW: 12.6 % (ref 11.7–15.4)
WBC: 7.8 10*3/uL (ref 3.4–10.8)

## 2023-01-16 LAB — CMP14+EGFR
ALT: 14 IU/L (ref 0–32)
AST: 20 IU/L (ref 0–40)
Albumin: 4.2 g/dL (ref 3.8–4.8)
Alkaline Phosphatase: 101 IU/L (ref 44–121)
BUN/Creatinine Ratio: 16 (ref 12–28)
BUN: 11 mg/dL (ref 8–27)
Bilirubin Total: 0.4 mg/dL (ref 0.0–1.2)
CO2: 21 mmol/L (ref 20–29)
Calcium: 9.9 mg/dL (ref 8.7–10.3)
Chloride: 100 mmol/L (ref 96–106)
Creatinine, Ser: 0.7 mg/dL (ref 0.57–1.00)
Globulin, Total: 2 g/dL (ref 1.5–4.5)
Glucose: 104 mg/dL — ABNORMAL HIGH (ref 70–99)
Potassium: 4.8 mmol/L (ref 3.5–5.2)
Sodium: 137 mmol/L (ref 134–144)
Total Protein: 6.2 g/dL (ref 6.0–8.5)
eGFR: 91 mL/min/{1.73_m2} (ref >59)

## 2023-01-16 LAB — HEMOGLOBIN A1C
Est. average glucose Bld gHb Est-mCnc: 120 mg/dL
Hgb A1c MFr Bld: 5.8 % — ABNORMAL HIGH (ref 4.8–5.6)

## 2023-01-16 LAB — VITAMIN D 25 HYDROXY (VIT D DEFICIENCY, FRACTURES): Vit D, 25-Hydroxy: 74.3 ng/mL (ref 30.0–100.0)

## 2023-01-22 NOTE — Assessment & Plan Note (Signed)
Her weight is stable at this time.  No alarm symptoms are present.  She has been working on increased activity and exercise which is fantastic!  Point I encouraged her to continue with her activities.

## 2023-01-22 NOTE — Assessment & Plan Note (Signed)
Prediabetes currently managed with diet and exercise.  She historically has had very good control over this.  Just recently there has been slight elevation.  We will repeat labs today for monitoring.  Recommend continuation of exercise and low carbohydrate/low-fat diet.

## 2023-01-22 NOTE — Assessment & Plan Note (Signed)
Tan and brown-colored macules scattered on the upper and lower extremities consistent with age related changes to the skin.  No alarm symptoms are present at this time.  We will trial tretinoin to see if this is helpful in reducing visibility.

## 2023-02-04 ENCOUNTER — Telehealth: Payer: Self-pay | Admitting: Nurse Practitioner

## 2023-02-04 NOTE — Telephone Encounter (Signed)
Pt called and says the cream that Huntley Dec gave for her dark spots is working but now her chest is breaking out in a rash and is wanting a referral to dermatology.

## 2023-02-06 ENCOUNTER — Other Ambulatory Visit: Payer: Self-pay

## 2023-02-06 ENCOUNTER — Encounter: Payer: Self-pay | Admitting: Nurse Practitioner

## 2023-02-06 DIAGNOSIS — L814 Other melanin hyperpigmentation: Secondary | ICD-10-CM

## 2023-02-06 DIAGNOSIS — R21 Rash and other nonspecific skin eruption: Secondary | ICD-10-CM

## 2023-02-08 IMAGING — XA DG FOOT 2V*L*
7 series · 7 of 7 positions shown · non-contrast
Comparison: Preoperative radiograph 12/01/2020

CLINICAL DATA: Elective surgery.

EXAM:
LEFT FOOT - 2 VIEW

[Series 8: cont. · 1 of 1 slices shown (1 of 7)]
[im 1/1]
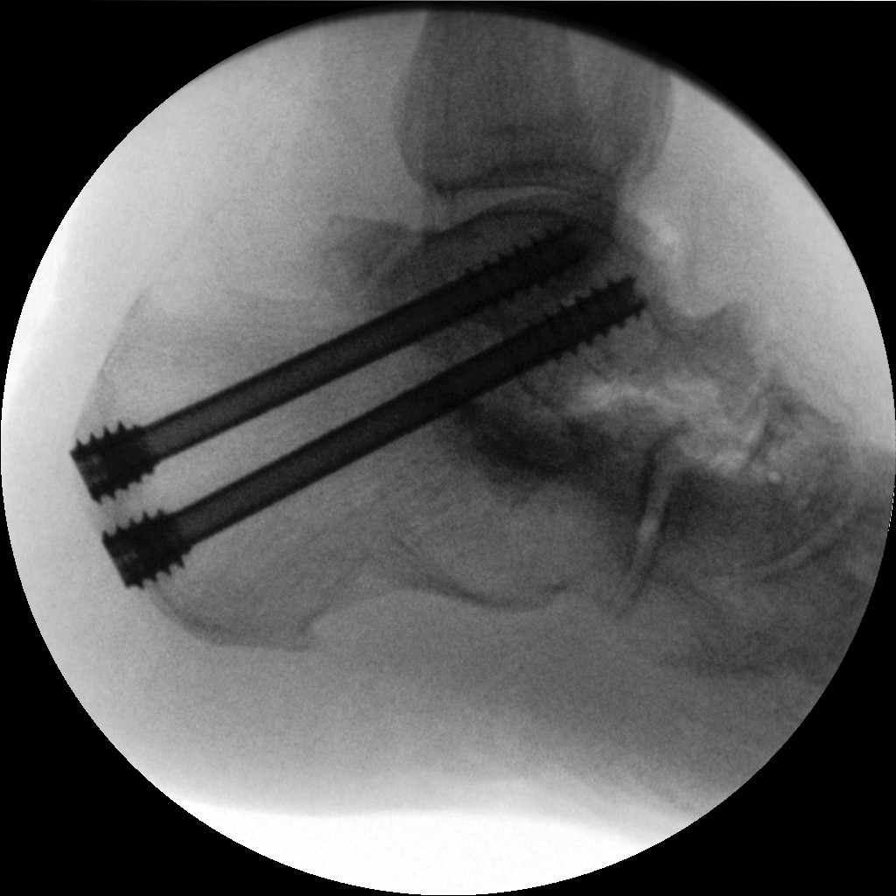

[Series 9: cont. · 1 of 1 slices shown (2 of 7)]
[im 1/1]
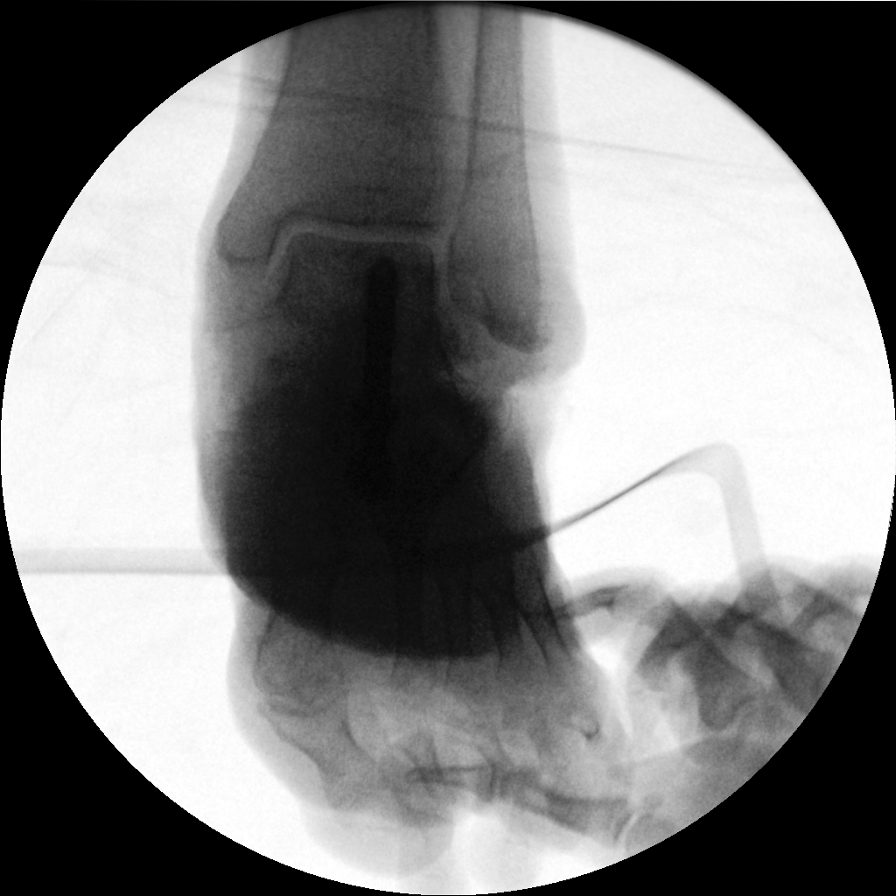

[Series 10: cont. · 1 of 1 slices shown (3 of 7)]
[im 1/1]
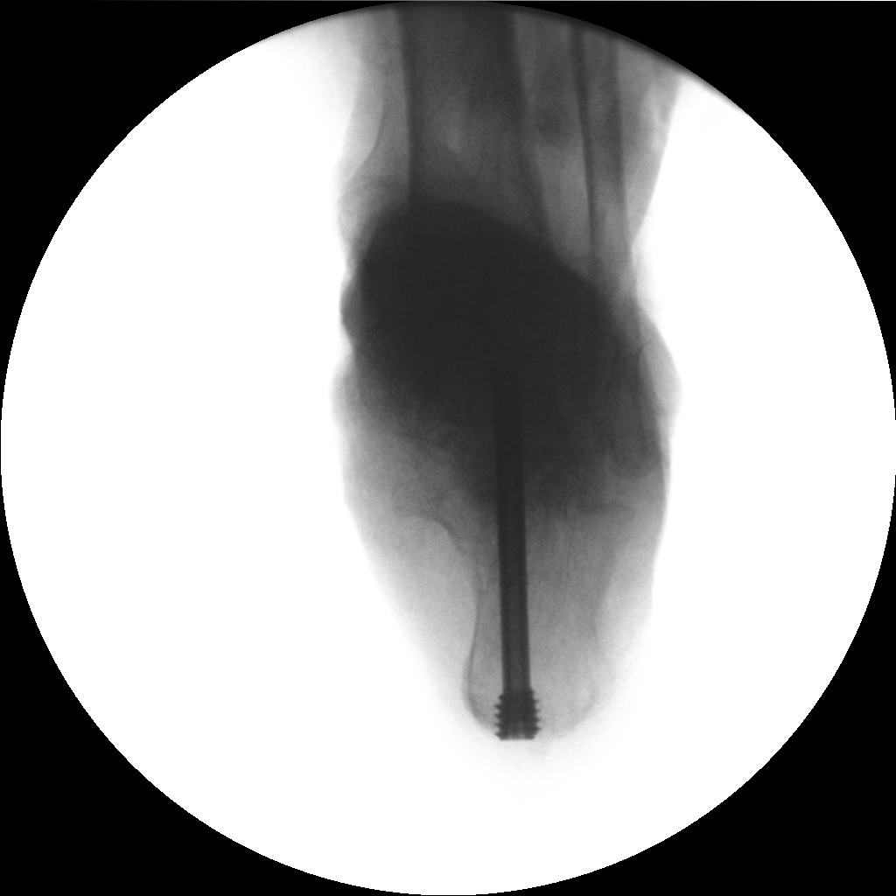

[Series 13: cont. · 1 of 1 slices shown (4 of 7)]
[im 1/1]
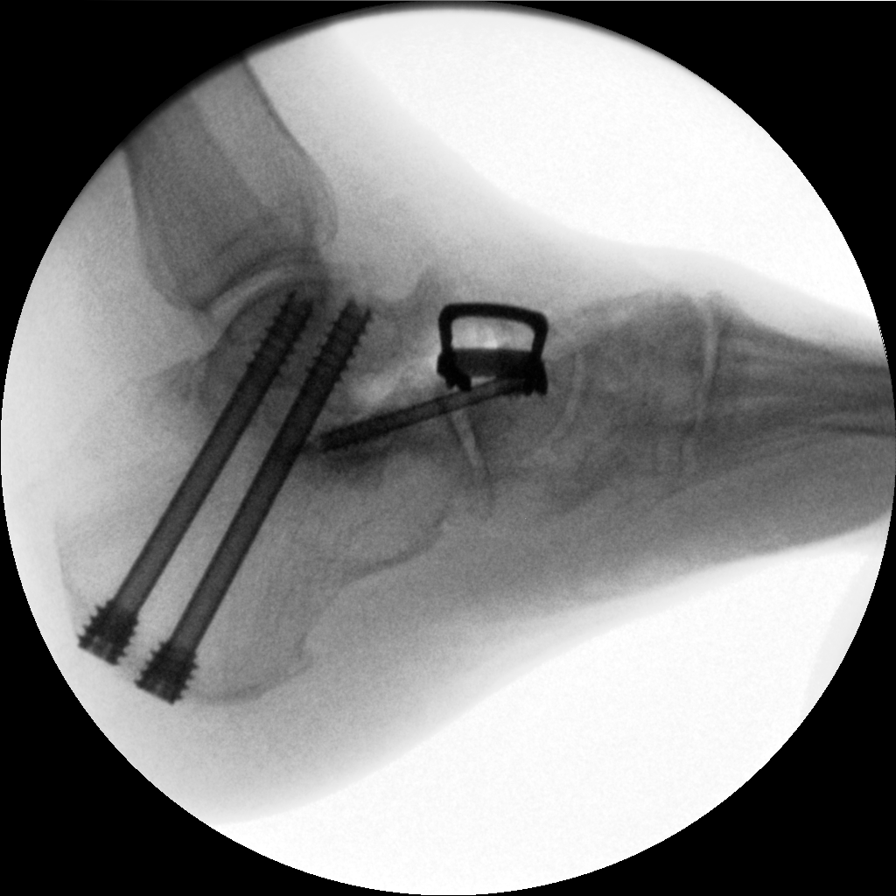

[Series 14: cont. · 1 of 1 slices shown (5 of 7)]
[im 1/1]
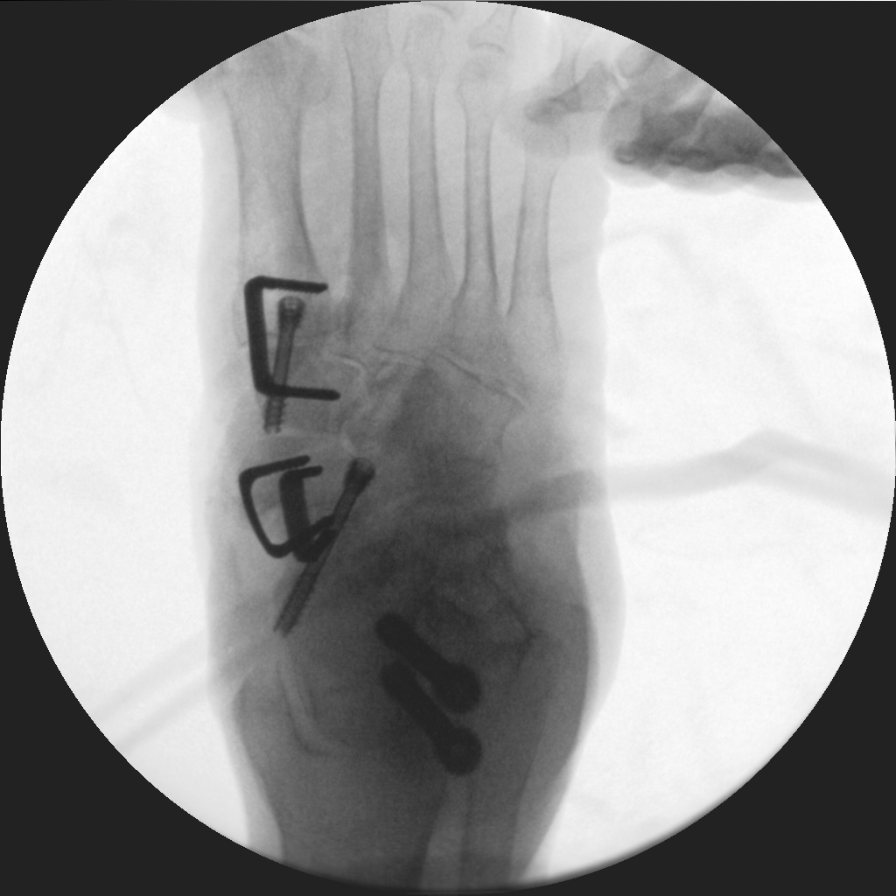

[Series 15: cont. · 1 of 1 slices shown (6 of 7)]
[im 1/1]
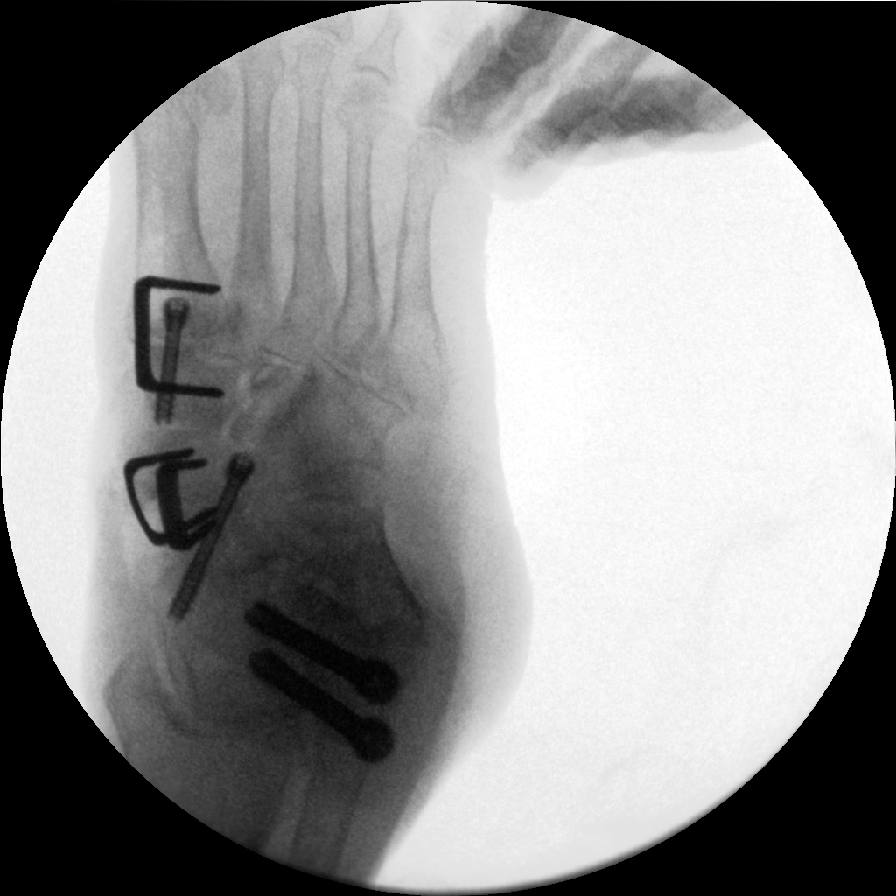

[Series 16: cont. · 1 of 1 slices shown (7 of 7)]
[im 1/1]
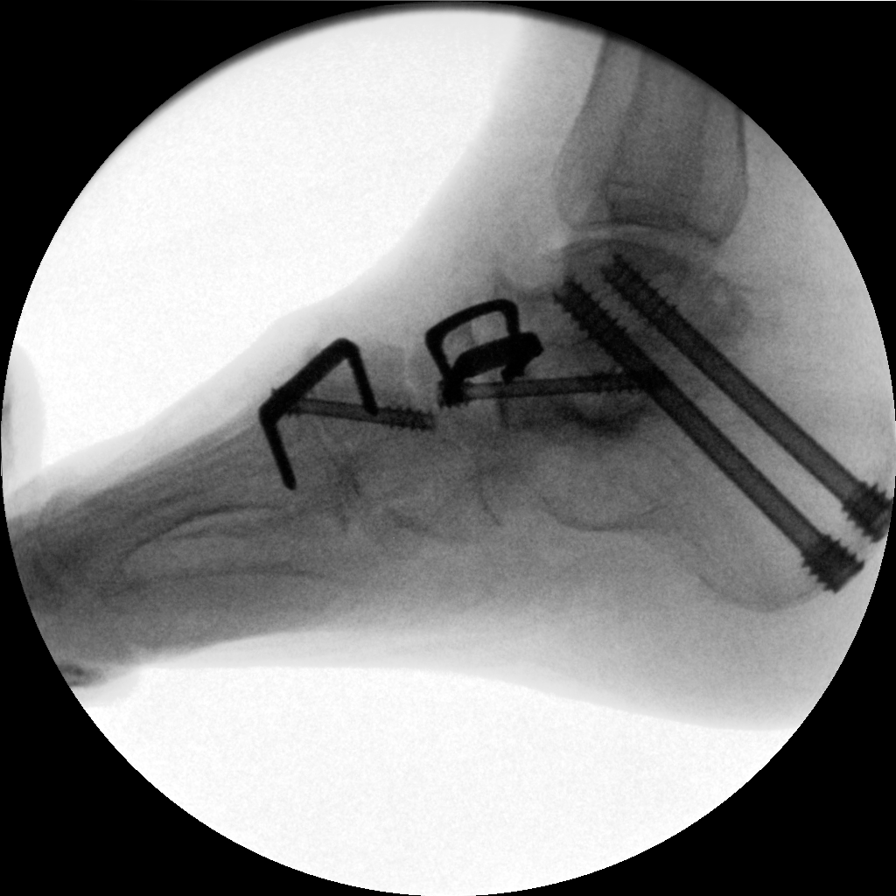

[7 of 7 positions shown; findings below may reference images not displayed]

FINDINGS: Seven fluoroscopic spot views of the left foot obtained in the
operating [HOSPITAL] screws traverse the talus and calcaneus in the
posterior subtalar joint. Fusion of the first metatarsal tarsal
articulation. There is also a medial tarsal fusion. Fluoroscopy time
3 minutes 17 seconds. Fluoroscopy dose not provided.
IMPRESSION: Intraoperative fluoroscopy during left foot surgery.

## 2023-03-06 ENCOUNTER — Encounter: Payer: Self-pay | Admitting: Family Medicine

## 2023-03-06 ENCOUNTER — Ambulatory Visit (INDEPENDENT_AMBULATORY_CARE_PROVIDER_SITE_OTHER): Payer: PPO | Admitting: Family Medicine

## 2023-03-06 VITALS — BP 110/70 | HR 76 | Temp 99.1°F | Ht 60.0 in | Wt 157.0 lb

## 2023-03-06 DIAGNOSIS — R21 Rash and other nonspecific skin eruption: Secondary | ICD-10-CM | POA: Diagnosis not present

## 2023-03-06 NOTE — Progress Notes (Signed)
Chief Complaint  Patient presents with   Rash    Rash on chest has been there for a few days. Has been itchy. She is concerned about shingles.    She has had a rash at her chest off and on.  It recurred in the last 2 days, when she noticed itchy blisters at her lower central chest.  She states it receded pretty quickly, no longer has blisters, and isn't itchy today. In the past the rash has gone further up her chest. This rash has been recurring over the last few months. She doesn't recall any change in products (soaps/detergents/lotions, etc).  She is using retin-A cream to spots on her arm. Clotrimazole/betamethasone was prescribed by [facility] for a rash on her shoulder that resolved.  She wasn't sure or clear as to whether she used the lotrisone cream on her current rash. She first said she did, and there was some irritation, then said she didn't use it, was afraid it would irritate it, then she couldn't recall if she tried it or not.    PMH, PSH, SH reviewed  Outpatient Encounter Medications as of 03/06/2023  Medication Sig Note   aspirin EC 81 MG tablet Take 81 mg by mouth daily. Swallow whole.    buPROPion (WELLBUTRIN) 100 MG tablet Take 1 tablet (100 mg total) by mouth every morning.    Calcium Carb-Cholecalciferol (CALCIUM 1000 + D) 1000-20 MG-MCG TABS     citalopram (CELEXA) 20 MG tablet Take 1.5 tablets (30 mg total) by mouth at bedtime.    glucosamine-chondroitin 500-400 MG tablet Take 1 tablet by mouth daily.    Multiple Vitamin (MULTIVITAMIN) tablet Take 1 tablet by mouth daily.    tretinoin (RETIN-A) 0.05 % cream Apply topically at bedtime. Apply sunscreen when out. 03/06/2023: Uses nightly for age spots   clotrimazole-betamethasone (LOTRISONE) cream Apply a thin layer to rash up to twice a day as needed. (Patient not taking: Reported on 03/06/2023) 03/06/2023: As needed   No facility-administered encounter medications on file as of 03/06/2023.   No Known  Allergies   ROS: no fever, chills, URI symptoms, GI complaints or shortness of breath. Rashes per HPI    PHYSICAL EXAM:  BP 110/70   Pulse 76   Temp 99.1 F (37.3 C) (Tympanic)   Ht 5' (1.524 m)   Wt 157 lb (71.2 kg)   BMI 30.66 kg/m   Well-appearing female in no distress Skin: location of rash is between breasts, at lower chest, central and towards the right side There are 7-8 lesions that are pink, maculopapular (could be unroofed/healing vesicles, per her history; no vesicles currently present).  There is no surrounding erythema, no crusting, or soft tissue swelling. Psych: normal mood, affect hygiene and grooming. Slightly anxious Neuro: alert and oriented, normal gait, cranial nerves grossly intact.   ASSESSMENT/PLAN:  Skin rash - unclear etiology, ddx reviewed (contact derm, bites). Reassured NOT shingles. Already improving. If recurs, try OTC HC cream. Avoid scratching  Low grade temp noted, but no evidence of infection on exam. Pt not aware of or symptomatic with any fever or chills.   Since the skin rash is no longer itchy, and appears to be healing, use an antibacterial ointment twice daily until fully healed. I don't know what the cause was, but if it recurs, I would use an over-the-counter hydrocortisone cream (cortaid 1%) 2-3 times/day for a few days. If this doesn't help, then you may use the clotrimazole/betamethosone cream if needed.  Use that very  sparingly, only to the affected area of skin, and rinse your hands after applying. Use it for no more than 2 weeks.

## 2023-03-06 NOTE — Patient Instructions (Signed)
Since the skin rash is no longer itchy, and appears to be healing, use an antibacterial ointment twice daily until fully healed. I don't know what the cause was, but if it recurs, I would use an over-the-counter hydrocortisone cream (cortaid 1%) 2-3 times/day for a few days. If this doesn't help, then you may use the clotrimazole/betamethosone cream if needed.  Use that very sparingly, only to the affected area of skin, and rinse your hands after applying. Use it for no more than 2 weeks.

## 2023-03-07 ENCOUNTER — Encounter: Payer: Self-pay | Admitting: Family Medicine

## 2023-04-17 ENCOUNTER — Other Ambulatory Visit: Payer: Self-pay | Admitting: Nurse Practitioner

## 2023-04-17 DIAGNOSIS — F419 Anxiety disorder, unspecified: Secondary | ICD-10-CM

## 2023-04-17 DIAGNOSIS — F32A Depression, unspecified: Secondary | ICD-10-CM

## 2023-05-08 ENCOUNTER — Other Ambulatory Visit: Payer: Self-pay | Admitting: Nurse Practitioner

## 2023-07-06 ENCOUNTER — Other Ambulatory Visit: Payer: Self-pay | Admitting: Nurse Practitioner

## 2023-07-06 DIAGNOSIS — F32A Depression, unspecified: Secondary | ICD-10-CM

## 2023-07-07 NOTE — Telephone Encounter (Signed)
 Last apt 01/15/23.

## 2023-07-15 ENCOUNTER — Ambulatory Visit: Payer: PPO | Admitting: Nurse Practitioner

## 2023-07-18 ENCOUNTER — Encounter: Payer: Self-pay | Admitting: Nurse Practitioner

## 2023-07-18 ENCOUNTER — Telehealth: Payer: Self-pay | Admitting: Nurse Practitioner

## 2023-07-18 ENCOUNTER — Ambulatory Visit: Payer: PPO | Admitting: Nurse Practitioner

## 2023-07-18 VITALS — BP 134/92 | HR 86 | Ht 60.0 in | Wt 157.0 lb

## 2023-07-18 DIAGNOSIS — R7303 Prediabetes: Secondary | ICD-10-CM

## 2023-07-18 DIAGNOSIS — Z0000 Encounter for general adult medical examination without abnormal findings: Secondary | ICD-10-CM | POA: Diagnosis not present

## 2023-07-18 DIAGNOSIS — L989 Disorder of the skin and subcutaneous tissue, unspecified: Secondary | ICD-10-CM

## 2023-07-18 DIAGNOSIS — F1021 Alcohol dependence, in remission: Secondary | ICD-10-CM | POA: Diagnosis not present

## 2023-07-18 DIAGNOSIS — F419 Anxiety disorder, unspecified: Secondary | ICD-10-CM

## 2023-07-18 DIAGNOSIS — M810 Age-related osteoporosis without current pathological fracture: Secondary | ICD-10-CM | POA: Diagnosis not present

## 2023-07-18 DIAGNOSIS — E2839 Other primary ovarian failure: Secondary | ICD-10-CM

## 2023-07-18 DIAGNOSIS — F32A Depression, unspecified: Secondary | ICD-10-CM | POA: Diagnosis not present

## 2023-07-18 LAB — LIPID PANEL

## 2023-07-18 MED ORDER — BUPROPION HCL ER (XL) 150 MG PO TB24: 150.0000 mg | ORAL_TABLET | Freq: Every day | ORAL | 1 refills | Status: DC

## 2023-07-18 NOTE — Telephone Encounter (Signed)
 Pt forgot to ask you if she is due for a mammo. She states she thinks her last one was 2 years ago but isn't sure.

## 2023-07-18 NOTE — Progress Notes (Signed)
 Shawna Clamp, DNP, AGNP-c [facility] [address] [place], [place] [address] Main Office [phone]  BP (!) 134/92   Pulse 86   Ht 5' (1.524 m)   Wt 157 lb (71.2 kg)   BMI 30.66 kg/m    Subjective:    Patient ID: Stacy Decker, female    DOB: 01/11/1951, 73 y.o.   MRN: [id number]  HPI: Stacy Decker is a 73 y.o. female presenting on 07/18/2023 for comprehensive medical examination.   History of Present Illness Stacy Decker is a 73 year old female with depression who presents with concerns about her mental health and medication management.  She has a history of depression and anxiety, which has worsened due to recent life events, including the loss of her dog in September and quitting her job in November of the previous year. She feels 'a little worthless' and experiences anxiety about leaving the house, attributing these feelings to her depression. She recalls an incident where she planned to attend a conference but decided not to go due to anxiety.  She has a history of psychiatric treatment and was previously under the care of a psychiatrist in [place], whom she liked, but she has since stopped seeing her psychiatrist. She continues to see a therapist occasionally for 'tune-ups.' She is currently taking Wellbutrin and citalopram, with the Wellbutrin dose recently reduced. She is concerned about the effectiveness of her current medication regimen and mentions a past experience with Prozac, which she found numbing.  She attends Alcoholics Anonymous (AA) meetings regularly, about three to five times a week, which she finds helpful. She has been sober for over thirty years and acknowledges a history of alcohol and marijuana use as self-medication for her depression. She describes her current living situation with a new kitten, which brings her some joy despite the challenges of pet ownership.  She is actively seeking employment to supplement her  income, as she is currently relying on Social Security and giving plasma twice a week for additional funds. She applies for approximately two jobs a week but feels her age may be a barrier to employment. She wants to work part-time, as she believes it would be beneficial for her mental health.  She mentions two areas on her skin with a scaly feeling. One on the upper middle of her back and the other on the posterior aspect of her right calf, proximal to the popliteal fossa. These have been present for about 6 months but are not changing. They do not hurt or itch.   No significant changes in bowel or bladder habits, although she mentions frequent urination due to high fluid intake. She reports a history of travel anxiety and low self-esteem since childhood.  Pertinent items are noted in HPI.    Most Recent Depression Screen:     07/18/2023    9:28 AM 07/26/2022   11:24 AM 07/12/2022    9:14 AM 07/20/2021    2:30 PM 09/21/2020    2:49 PM  Depression screen PHQ 2/9  Decreased Interest 1 0 0 0 0  Down, Depressed, Hopeless 2 0 0 0 0  PHQ - 2 Score 3 0 0 0 0  Altered sleeping 1 1   0  Tired, decreased energy 1 1   0  Change in appetite 1 0   0  Feeling bad or failure about yourself  0 0   0  Trouble concentrating 0 0   0  Moving slowly or fidgety/restless 0 0  0  Suicidal thoughts 0 0   0  PHQ-9 Score 6 2   0  Difficult doing work/chores Somewhat difficult Not difficult at all   Not difficult at all   Most Recent Anxiety Screen:      No data to display         Most Recent Fall Screen:    07/18/2023    9:28 AM 07/26/2022   11:24 AM 07/12/2022    9:14 AM 07/20/2021    2:29 PM 09/21/2020    2:49 PM  Fall Risk   Falls in the past year? 0 0 0 0 0  Number falls in past yr: 0 0 0  0  Injury with Fall? 0 0 0  0  Risk for fall due to : No Fall Risks Medication side effect No Fall Risks Impaired balance/gait;Impaired mobility;Medication side effect No Fall Risks  Follow up Falls evaluation  completed Falls prevention discussed;Education provided;Falls evaluation completed Falls evaluation completed Falls evaluation completed;Education provided;Falls prevention discussed Falls evaluation completed    Past medical history, surgical history, medications, allergies, family history and social history reviewed with patient today and changes made to appropriate areas of the chart.  Past Medical History:  Past Medical History:  Diagnosis Date   Acquired hallux valgus of left foot    Acquired pes planus of left foot 07/10/2017   Anxiety    Depression    History of vertigo    Osteoporosis    Pes planus    Substance abuse (HCC)    recovering alcoholic-sober since 1988   Medications:  Current Outpatient Medications on File Prior to Visit  Medication Sig   aspirin EC 81 MG tablet Take 81 mg by mouth daily. Swallow whole.   Calcium Carb-Cholecalciferol (CALCIUM 1000 + D) 1000-20 MG-MCG TABS    citalopram (CELEXA) 20 MG tablet TAKE 1 AND 1/2 TABLETS (30 MG TOTAL) BY MOUTH AT BEDTIME.   glucosamine-chondroitin 500-400 MG tablet Take 1 tablet by mouth daily.   Multiple Vitamin (MULTIVITAMIN) tablet Take 1 tablet by mouth daily.   clotrimazole-betamethasone (LOTRISONE) cream Apply a thin layer to rash up to twice a day as needed. (Patient not taking: Reported on 07/18/2023)   COMIRNATY syringe Inject 0.3 mLs into the muscle once. (Patient not taking: Reported on 07/18/2023)   FLUZONE HIGH-DOSE 0.5 ML injection Inject 0.5 mLs into the muscle once. (Patient not taking: Reported on 07/18/2023)   tretinoin (RETIN-A) 0.05 % cream Apply topically at bedtime. Apply sunscreen when out. (Patient not taking: Reported on 07/18/2023)   No current facility-administered medications on file prior to visit.   Surgical History:  Past Surgical History:  Procedure Laterality Date   AUGMENTATION MAMMAPLASTY Bilateral    BUNIONECTOMY Left 06/29/2021   Procedure: BUNIONECTOMY;  Surgeon: Edwin Cap, DPM;   Location: [facility] ORS;  Service: Podiatry;  Laterality: Left;  POPLITEAL AND SAPHENOUS BLOCK   carpel tunnel Bilateral    COLONOSCOPY     FLAT FOOT CORRECTION Left 06/29/2021   Procedure: FLAT FOOT CORRECTION;  Surgeon: Edwin Cap, DPM;  Location: [facility] ORS;  Service: Podiatry;  Laterality: Left;   PLACEMENT OF BREAST IMPLANTS     Allergies:  No Known Allergies Family History:  Family History  Problem Relation Age of Onset   Hypertension Mother    Cancer Mother        breast in remission age 5   Diabetes Mother    Diabetes Paternal Grandmother    Dementia Father  UTIs    Other Father        brain tumor   Other Sister        hip replacements   Alcohol abuse Sister    Other Sister        breast cysts- benign   Colon cancer Neg Hx    Stomach cancer Neg Hx    Rectal cancer Neg Hx    Esophageal cancer Neg Hx    Liver cancer Neg Hx    Breast cancer Neg Hx    BRCA 1/2 Neg Hx        Objective:    BP (!) 134/92   Pulse 86   Ht 5' (1.524 m)   Wt 157 lb (71.2 kg)   BMI 30.66 kg/m   Wt Readings from Last 3 Encounters:  07/18/23 157 lb (71.2 kg)  03/06/23 157 lb (71.2 kg)  01/15/23 159 lb (72.1 kg)    Physical Exam Vitals and nursing note reviewed.  Constitutional:      General: She is not in acute distress.    Appearance: Normal appearance.  HENT:     Head: Normocephalic and atraumatic.     Right Ear: Hearing, tympanic membrane, ear canal and external ear normal.     Left Ear: Hearing, tympanic membrane, ear canal and external ear normal.     Nose: Nose normal.     Right Sinus: No maxillary sinus tenderness or frontal sinus tenderness.     Left Sinus: No maxillary sinus tenderness or frontal sinus tenderness.     Mouth/Throat:     Lips: Pink.     Mouth: Mucous membranes are moist.     Pharynx: Oropharynx is clear.  Eyes:     General: Lids are normal. Vision grossly intact.     Extraocular Movements: Extraocular movements intact.     Conjunctiva/sclera:  Conjunctivae normal.     Pupils: Pupils are equal, round, and reactive to light.     Funduscopic exam:    Right eye: Red reflex present.        Left eye: Red reflex present.    Visual Fields: Right eye visual fields normal and left eye visual fields normal.  Neck:     Thyroid: No thyromegaly.     Vascular: No carotid bruit.  Cardiovascular:     Rate and Rhythm: Normal rate and regular rhythm.     Chest Wall: PMI is not displaced.     Pulses: Normal pulses.          Dorsalis pedis pulses are 2+ on the right side and 2+ on the left side.       Posterior tibial pulses are 2+ on the right side and 2+ on the left side.     Heart sounds: Normal heart sounds. No murmur heard. Pulmonary:     Effort: Pulmonary effort is normal. No respiratory distress.     Breath sounds: Normal breath sounds.  Abdominal:     General: Abdomen is flat. Bowel sounds are normal. There is no distension.     Palpations: Abdomen is soft. There is no hepatomegaly, splenomegaly or mass.     Tenderness: There is no abdominal tenderness. There is no right CVA tenderness, left CVA tenderness, guarding or rebound.  Musculoskeletal:        General: Normal range of motion.     Cervical back: Full passive range of motion without pain, normal range of motion and neck supple. No tenderness.     Right lower  leg: No edema.     Left lower leg: No edema.  Feet:     Left foot:     Toenail Condition: Left toenails are normal.  Lymphadenopathy:     Cervical: No cervical adenopathy.     Upper Body:     Right upper body: No supraclavicular adenopathy.     Left upper body: No supraclavicular adenopathy.  Skin:    General: Skin is warm and dry.     Capillary Refill: Capillary refill takes less than 2 seconds.     Findings: Lesion present.     Nails: There is no clubbing.       Neurological:     General: No focal deficit present.     Mental Status: She is alert and oriented to person, place, and time.     GCS: GCS eye  subscore is 4. GCS verbal subscore is 5. GCS motor subscore is 6.     Sensory: Sensation is intact.     Motor: Motor function is intact.     Coordination: Coordination is intact.     Gait: Gait is intact.     Deep Tendon Reflexes: Reflexes are normal and symmetric.  Psychiatric:        Attention and Perception: Attention normal.        Mood and Affect: Mood normal.        Speech: Speech normal.        Behavior: Behavior normal. Behavior is cooperative.        Thought Content: Thought content normal.        Cognition and Memory: Cognition and memory normal.        Judgment: Judgment normal.      Results for orders placed or performed in visit on 07/18/23  CBC with Differential/Platelet   Collection Time: 07/18/23 10:35 AM  Result Value Ref Range   WBC 9.0 3.4 - 10.8 x10E3/uL   RBC 5.22 3.77 - 5.28 x10E6/uL   Hemoglobin 15.8 11.1 - 15.9 g/dL   Hematocrit 16.1 (H) 09.6 - 46.6 %   MCV 91 79 - 97 fL   MCH 30.3 26.6 - 33.0 pg   MCHC 33.2 31.5 - 35.7 g/dL   RDW 04.5 40.9 - 81.1 %   Platelets 285 150 - 450 x10E3/uL   Neutrophils 61 Not Estab. %   Lymphs 20 Not Estab. %   Monocytes 13 Not Estab. %   Eos 5 Not Estab. %   Basos 1 Not Estab. %   Neutrophils Absolute 5.4 1.4 - 7.0 x10E3/uL   Lymphocytes Absolute 1.8 0.7 - 3.1 x10E3/uL   Monocytes Absolute 1.2 (H) 0.1 - 0.9 x10E3/uL   EOS (ABSOLUTE) 0.4 0.0 - 0.4 x10E3/uL   Basophils Absolute 0.1 0.0 - 0.2 x10E3/uL   Immature Granulocytes 0 Not Estab. %   Immature Grans (Abs) 0.0 0.0 - 0.1 x10E3/uL  CMP14+EGFR   Collection Time: 07/18/23 10:35 AM  Result Value Ref Range   Glucose 102 (H) 70 - 99 mg/dL   BUN 13 8 - 27 mg/dL   Creatinine, Ser 9.14 0.57 - 1.00 mg/dL   eGFR 78 >78 GN/FAO/1.30   BUN/Creatinine Ratio 16 12 - 28   Sodium 143 134 - 144 mmol/L   Potassium 4.3 3.5 - 5.2 mmol/L   Chloride 105 96 - 106 mmol/L   CO2 23 20 - 29 mmol/L   Calcium 9.8 8.7 - 10.3 mg/dL   Total Protein 5.5 (L) 6.0 - 8.5 g/dL   Albumin 3.8  3.8  - 4.8 g/dL   Globulin, Total 1.7 1.5 - 4.5 g/dL   Bilirubin Total 0.3 0.0 - 1.2 mg/dL   Alkaline Phosphatase 94 44 - 121 IU/L   AST 23 0 - 40 IU/L   ALT 12 0 - 32 IU/L  Hemoglobin A1c   Collection Time: 07/18/23 10:35 AM  Result Value Ref Range   Hgb A1c MFr Bld 5.6 4.8 - 5.6 %   Est. average glucose Bld gHb Est-mCnc 114 mg/dL  Lipid panel   Collection Time: 07/18/23 10:35 AM  Result Value Ref Range   Cholesterol, Total 182 100 - 199 mg/dL   Triglycerides 161 0 - 149 mg/dL   HDL 42 >09 mg/dL   VLDL Cholesterol Cal 25 5 - 40 mg/dL   LDL Chol Calc (NIH) 604 (H) 0 - 99 mg/dL   Chol/HDL Ratio 4.3 0.0 - 4.4 ratio       Assessment & Plan:   Problem List Items Addressed This Visit     Anxiety and depression   Reports feeling worthless and having difficulty leaving the house, attributed to depression. Symptoms exacerbated by recent life events including the loss of her dog and job. Currently on low doses of bupropion and citalopram. Symptoms suggest depression is not well-controlled. Discussed increasing bupropion to 150 mg XL once daily. Explained that bupropion acts as a booster for SSRIs/SNRIs and may improve symptoms. Patient prefers to avoid feeling numb, as experienced with fluoxetine in the past. - Increase bupropion to 150 mg XL once daily. - Continue citalopram as prescribed. - Schedule a follow-up phone visit in two months to assess medication adjustment.      Relevant Medications   buPROPion (WELLBUTRIN XL) 150 MG 24 hr tablet   Recovering alcoholic (HCC)   Doing well with AA with no concerns at the present time.       Skin lesion of back   Lesion on her back appears to be start of seborrheic keratosis.  No itching or significant changes noted. Discussed that seborrheic keratosis is benign and common with aging. Advised to monitor for changes. - Monitor the lesion for any changes in size or symptoms. - Report if the lesion grows or becomes bothersome.      Encounter  for annual physical exam   CPE completed today. Review of HM activities and recommendations discussed and provided on AVS. Anticipatory guidance, diet, and exercise recommendations provided. Medications, allergies, and hx reviewed and updated as necessary. Orders placed as listed below.  Plan: - Labs ordered. Will make changes as necessary based on results.  - I will review these results and send recommendations via MyChart or a telephone call.  - F/U with CPE in 1 year or sooner for acute/chronic health needs as directed.        Relevant Medications   buPROPion (WELLBUTRIN XL) 150 MG 24 hr tablet   Other Relevant Orders   CBC with Differential/Platelet (Completed)   CMP14+EGFR (Completed)   Hemoglobin A1c (Completed)   Lipid panel (Completed)   Prediabetes - Primary   Estrogen deficiency   Osteoporosis without current pathological fracture      Follow up plan: Return in about 2 months (around 09/15/2023) for Med Management 45- Mood Check-in virtual OK.  NEXT PREVENTATIVE PHYSICAL DUE IN 1 YEAR.  PATIENT COUNSELING PROVIDED FOR ALL ADULT PATIENTS: A well balanced diet low in saturated fats, cholesterol, and moderation in carbohydrates.  This can be as simple as monitoring portion sizes and cutting back on  sugary beverages such as soda and juice to start with.    Daily water consumption of at least 64 ounces.  Physical activity at least 180 minutes per week.  If just starting out, start 10 minutes a day and work your way up.   This can be as simple as taking the stairs instead of the elevator and walking 2-3 laps around the office  purposefully every day.   STD protection, partner selection, and regular testing if high risk.  Limited consumption of alcoholic beverages if alcohol is consumed. For men, I recommend no more than 14 alcoholic beverages per week, spread out throughout the week (max 2 per day). Avoid "binge" drinking or consuming large quantities of alcohol in one  setting.  Please let me know if you feel you may need help with reduction or quitting alcohol consumption.   Avoidance of nicotine, if used. Please let me know if you feel you may need help with reduction or quitting nicotine use.   Daily mental health attention. This can be in the form of 5 minute daily meditation, prayer, journaling, yoga, reflection, etc.  Purposeful attention to your emotions and mental state can significantly improve your overall wellbeing  and  Health.  Please know that I am here to help you with all of your health care goals and am happy to work with you to find a solution that works best for you.  The greatest advice I have received with any changes in life are to take it one step at a time, that even means if all you can focus on is the next 60 seconds, then do that and celebrate your victories.  With any changes in life, you will have set backs, and that is OK. The important thing to remember is, if you have a set back, it is not a failure, it is an opportunity to try again! Screening Testing Mammogram Every 1 -2 years based on history and risk factors Starting at age 44 Pap Smear Ages 21-39 every 3 years Ages 70-65 every 5 years with HPV testing More frequent testing may be required based on results and history Colon Cancer Screening Every 1-10 years based on test performed, risk factors, and history Starting at age 17 Bone Density Screening Every 2-10 years based on history Starting at age 75 for women Recommendations for men differ based on medication usage, history, and risk factors AAA Screening One time ultrasound Men 47-37 years old who have every smoked Lung Cancer Screening Low Dose Lung CT every 12 months Age 68-80 years with a 30 pack-year smoking history who still smoke or who have quit within the last 15 years   Screening Labs Routine  Labs: Complete Blood Count (CBC), Complete Metabolic Panel (CMP), Cholesterol (Lipid Panel) Every 6-12  months based on history and medications May be recommended more frequently based on current conditions or previous results Hemoglobin A1c Lab Every 3-12 months based on history and previous results Starting at age 84 or earlier with diagnosis of diabetes, high cholesterol, BMI >26, and/or risk factors Frequent monitoring for patients with diabetes to ensure blood sugar control Thyroid Panel (TSH) Every 6 months based on history, symptoms, and risk factors May be repeated more often if on medication HIV One time testing for all patients 10 and older May be repeated more frequently for patients with increased risk factors or exposure Hepatitis C One time testing for all patients 69 and older May be repeated more frequently for patients with increased risk  factors or exposure Gonorrhea, Chlamydia Every 12 months for all sexually active persons 13-24 years Additional monitoring may be recommended for those who are considered high risk or who have symptoms Every 12 months for any woman on birth control, regardless of sexual activity PSA Men 54-93 years old with risk factors Additional screening may be recommended from age 29-69 based on risk factors, symptoms, and history  Vaccine Recommendations Tetanus Booster All adults every 10 years Flu Vaccine All patients 6 months and older every year COVID Vaccine All patients 12 years and older Initial dosing with booster May recommend additional booster based on age and health history HPV Vaccine 2 doses all patients age 65-26 Dosing may be considered for patients over 26 Shingles Vaccine (Shingrix) 2 doses all adults 55 years and older Pneumonia (Pneumovax 69) All adults 65 years and older May recommend earlier dosing based on health history One year apart from Prevnar 52 Pneumonia (Prevnar 21) All adults 65 years and older Dosed 1 year after Pneumovax 23 Pneumonia (Prevnar 20) One time alternative to the two dosing of 13 and 23 For  all adults with initial dose of 23, 20 is recommended 1 year later For all adults with initial dose of 13, 23 is still recommended as second option 1 year later

## 2023-07-18 NOTE — Patient Instructions (Addendum)
 Increase your Wellbutrin to 1 tablet in the morning and 1 tablet at bedtime until you run out of your current prescription.  I have sent in the 150mg  once daily dose. When you finish your supply at home start this dose once a day.   Try daily affirmations and three good things to help with mood.   Let's plan to check in in about 2 months to see how the increased dose of Wellbutrin is working.

## 2023-07-19 LAB — LIPID PANEL
Chol/HDL Ratio: 4.3 ratio (ref 0.0–4.4)
Cholesterol, Total: 182 mg/dL (ref 100–199)
HDL: 42 mg/dL (ref >39)
LDL Chol Calc (NIH): 115 mg/dL — ABNORMAL HIGH (ref 0–99)
Triglycerides: 138 mg/dL (ref 0–149)
VLDL Cholesterol Cal: 25 mg/dL (ref 5–40)

## 2023-07-19 LAB — CMP14+EGFR
ALT: 12 IU/L (ref 0–32)
AST: 23 IU/L (ref 0–40)
Albumin: 3.8 g/dL (ref 3.8–4.8)
Alkaline Phosphatase: 94 IU/L (ref 44–121)
BUN/Creatinine Ratio: 16 (ref 12–28)
BUN: 13 mg/dL (ref 8–27)
Bilirubin Total: 0.3 mg/dL (ref 0.0–1.2)
CO2: 23 mmol/L (ref 20–29)
Calcium: 9.8 mg/dL (ref 8.7–10.3)
Chloride: 105 mmol/L (ref 96–106)
Creatinine, Ser: 0.79 mg/dL (ref 0.57–1.00)
Globulin, Total: 1.7 g/dL (ref 1.5–4.5)
Glucose: 102 mg/dL — ABNORMAL HIGH (ref 70–99)
Potassium: 4.3 mmol/L (ref 3.5–5.2)
Sodium: 143 mmol/L (ref 134–144)
Total Protein: 5.5 g/dL — ABNORMAL LOW (ref 6.0–8.5)
eGFR: 78 mL/min/{1.73_m2} (ref >59)

## 2023-07-19 LAB — CBC WITH DIFFERENTIAL/PLATELET
Basophils Absolute: 0.1 10*3/uL (ref 0.0–0.2)
Basos: 1 %
EOS (ABSOLUTE): 0.4 10*3/uL (ref 0.0–0.4)
Eos: 5 %
Hematocrit: 47.6 % — ABNORMAL HIGH (ref 34.0–46.6)
Hemoglobin: 15.8 g/dL (ref 11.1–15.9)
Immature Grans (Abs): 0 10*3/uL (ref 0.0–0.1)
Immature Granulocytes: 0 %
Lymphocytes Absolute: 1.8 10*3/uL (ref 0.7–3.1)
Lymphs: 20 %
MCH: 30.3 pg (ref 26.6–33.0)
MCHC: 33.2 g/dL (ref 31.5–35.7)
MCV: 91 fL (ref 79–97)
Monocytes Absolute: 1.2 10*3/uL — ABNORMAL HIGH (ref 0.1–0.9)
Monocytes: 13 %
Neutrophils Absolute: 5.4 10*3/uL (ref 1.4–7.0)
Neutrophils: 61 %
Platelets: 285 10*3/uL (ref 150–450)
RBC: 5.22 x10E6/uL (ref 3.77–5.28)
RDW: 12.5 % (ref 11.7–15.4)
WBC: 9 10*3/uL (ref 3.4–10.8)

## 2023-07-19 LAB — HEMOGLOBIN A1C
Est. average glucose Bld gHb Est-mCnc: 114 mg/dL
Hgb A1c MFr Bld: 5.6 % (ref 4.8–5.6)

## 2023-07-21 NOTE — Assessment & Plan Note (Signed)

## 2023-07-21 NOTE — Assessment & Plan Note (Signed)
 Doing well with AA with no concerns at the present time.

## 2023-07-21 NOTE — Assessment & Plan Note (Signed)
 Reports feeling worthless and having difficulty leaving the house, attributed to depression. Symptoms exacerbated by recent life events including the loss of her dog and job. Currently on low doses of bupropion and citalopram. Symptoms suggest depression is not well-controlled. Discussed increasing bupropion to 150 mg XL once daily. Explained that bupropion acts as a booster for SSRIs/SNRIs and may improve symptoms. Patient prefers to avoid feeling numb, as experienced with fluoxetine in the past. - Increase bupropion to 150 mg XL once daily. - Continue citalopram as prescribed. - Schedule a follow-up phone visit in two months to assess medication adjustment.

## 2023-07-21 NOTE — Assessment & Plan Note (Signed)
 Lesion on her back appears to be start of seborrheic keratosis.  No itching or significant changes noted. Discussed that seborrheic keratosis is benign and common with aging. Advised to monitor for changes. - Monitor the lesion for any changes in size or symptoms. - Report if the lesion grows or becomes bothersome.

## 2023-07-23 ENCOUNTER — Encounter: Payer: Self-pay | Admitting: Nurse Practitioner

## 2023-07-29 ENCOUNTER — Ambulatory Visit: Payer: PPO

## 2023-07-29 DIAGNOSIS — Z0000 Encounter for general adult medical examination without abnormal findings: Secondary | ICD-10-CM | POA: Diagnosis not present

## 2023-07-29 NOTE — Progress Notes (Signed)
 Subjective:   Stacy Decker is a 73 y.o. who presents for a Medicare Wellness preventive visit.  Visit Complete: Virtual I connected with  Stacy Decker on 07/29/23 by a video and audio enabled telemedicine application and verified that I am speaking with the correct person using two identifiers.  Patient Location: Home  Provider Location: Office/Clinic  I discussed the limitations of evaluation and management by telemedicine. The patient expressed understanding and agreed to proceed.  Vital Signs: Because this visit was a virtual/telehealth visit, some criteria may be missing or patient reported. Any vitals not documented were not able to be obtained and vitals that have been documented are patient reported.    AWV Questionnaire: No: Patient Medicare AWV questionnaire was not completed prior to this visit.  Cardiac Risk Factors include: advanced age (>63men, >41 women)     Objective:    Today's Vitals   There is no height or weight on file to calculate BMI.     07/29/2023    9:31 AM 07/26/2022   11:36 AM 09/12/2021   10:55 AM 07/20/2021    2:29 PM 06/29/2021   11:19 AM 06/20/2021   12:51 PM 02/28/2021   11:09 AM  Advanced Directives  Does Patient Have a Medical Advance Directive? Yes Yes No Yes Yes Yes No  Type of Estate agent of [place];Living will Healthcare Power of [place];Living will  Healthcare Power of [place];Living will Healthcare Power of [place];Living will    Does patient want to make changes to medical advance directive?     No - Patient declined    Copy of Healthcare Power of Attorney in Chart? Yes - validated most recent copy scanned in chart (See row information) Yes - validated most recent copy scanned in chart (See row information)  Yes - validated most recent copy scanned in chart (See row information) Yes - validated most recent copy scanned in chart (See row information)    Would patient like information on creating a  medical advance directive?   No - Patient declined    No - Patient declined    Current Medications (verified) Outpatient Encounter Medications as of 07/29/2023  Medication Sig   aspirin EC 81 MG tablet Take 81 mg by mouth daily. Swallow whole.   buPROPion (WELLBUTRIN XL) 150 MG 24 hr tablet Take 1 tablet (150 mg total) by mouth daily.   Calcium Carb-Cholecalciferol (CALCIUM 1000 + D) 1000-20 MG-MCG TABS    citalopram (CELEXA) 20 MG tablet TAKE 1 AND 1/2 TABLETS (30 MG TOTAL) BY MOUTH AT BEDTIME.   clotrimazole-betamethasone (LOTRISONE) cream Apply a thin layer to rash up to twice a day as needed.   glucosamine-chondroitin 500-400 MG tablet Take 1 tablet by mouth daily.   COMIRNATY syringe Inject 0.3 mLs into the muscle once. (Patient not taking: Reported on 07/29/2023)   FLUZONE HIGH-DOSE 0.5 ML injection Inject 0.5 mLs into the muscle once. (Patient not taking: Reported on 07/29/2023)   Multiple Vitamin (MULTIVITAMIN) tablet Take 1 tablet by mouth daily. (Patient not taking: Reported on 07/29/2023)   tretinoin (RETIN-A) 0.05 % cream Apply topically at bedtime. Apply sunscreen when out. (Patient not taking: Reported on 07/29/2023)   No facility-administered encounter medications on file as of 07/29/2023.    Allergies (verified) Patient has no known allergies.   History: Past Medical History:  Diagnosis Date   Acquired hallux valgus of left foot    Acquired pes planus of left foot 07/10/2017   Anxiety  Depression    History of vertigo    Osteoporosis    Pes planus    Substance abuse (HCC)    recovering alcoholic-sober since 1988   Past Surgical History:  Procedure Laterality Date   AUGMENTATION MAMMAPLASTY Bilateral    BUNIONECTOMY Left 06/29/2021   Procedure: BUNIONECTOMY;  Surgeon: Edwin Cap, DPM;  Location: [facility] OR[facility];  Service: Podiatry;  Laterality: Left;  POPLITEAL AND SAPHENOUS BLOCK   carpel tunnel Bilateral    COLONOSCOPY     FLAT FOOT CORRECTION Left 06/29/2021    Procedure: FLAT FOOT CORRECTION;  Surgeon: Edwin Cap, DPM;  Location: [facility] OR[facility];  Service: Podiatry;  Laterality: Left;   PLACEMENT OF BREAST IMPLANTS     Family History  Problem Relation Age of Onset   Hypertension Mother    Cancer Mother        breast in remission age 30   Diabetes Mother    Diabetes Paternal Grandmother    Dementia Father        UTIs    Other Father        brain tumor   Other Sister        hip replacements   Alcohol abuse Sister    Other Sister        breast cysts- benign   Colon cancer Neg Hx    Stomach cancer Neg Hx    Rectal cancer Neg Hx    Esophageal cancer Neg Hx    Liver cancer Neg Hx    Breast cancer Neg Hx    BRCA 1/2 Neg Hx    Social History   Socioeconomic History   Marital status: Divorced    Spouse name: Not on file   Number of children: 0   Years of education: Not on file   Highest education level: Not on file  Occupational History   Occupation: Therapy asst At [employer]  Tobacco Use   Smoking status: Former    Current packs/day: 0.00    Average packs/day: 1.5 packs/day for 25.0 years (37.5 ttl pk-yrs)    Types: Cigarettes    Start date: 06/29/1971    Quit date: 06/28/1996    Years since quitting: 27.1    Passive exposure: Past   Smokeless tobacco: Never  Vaping Use   Vaping status: Never Used  Substance and Sexual Activity   Alcohol use: No    Alcohol/week: 0.0 standard drinks of alcohol    Comment: recovering alcoholic quit in 8295   Drug use: No    Comment: former marijuana   Sexual activity: Not Currently    Birth control/protection: Post-menopausal  Other Topics Concern   Not on file  Social History Narrative   Not on file   Social Drivers of Health   Financial Resource Strain: Low Risk  (07/29/2023)   Overall Financial Resource Strain (CARDIA)    Difficulty of Paying Living Expenses: Not hard at all  Food Insecurity: No Food Insecurity (07/29/2023)   Hunger Vital Sign    Worried About Running Out of Food  in the Last Year: Never true    Ran Out of Food in the Last Year: Never true  Transportation Needs: No Transportation Needs (07/29/2023)   PRAPARE - Administrator, Civil Service (Medical): No    Lack of Transportation (Non-Medical): No  Physical Activity: Inactive (07/29/2023)   Exercise Vital Sign    Days of Exercise per Week: 0 days    Minutes of Exercise per Session: 0 min  Stress: No Stress Concern Present (07/29/2023)   [employer] of Occupational Health - Occupational Stress Questionnaire    Feeling of Stress : Not at all  Social Connections: Moderately Isolated (07/29/2023)   Social Connection and Isolation Panel [NHANES]    Frequency of Communication with Friends and Family: More than three times a week    Frequency of Social Gatherings with Friends and Family: More than three times a week    Attends Religious Services: Never    Database administrator or Organizations: Yes    Attends Engineer, structural: More than 4 times per year    Marital Status: Divorced    Tobacco Counseling Counseling given: Not Answered    Clinical Intake:  Pre-visit preparation completed: Yes  Pain : No/denies pain     Nutritional Risks: None Diabetes: No  How often do you need to have someone help you when you read instructions, pamphlets, or other written materials from your doctor or pharmacy?: 1 - Never  Interpreter Needed?: No  Information entered by :: NAllen LPN   Activities of Daily Living     07/29/2023    9:24 AM  In your present state of health, do you have any difficulty performing the following activities:  Hearing? 0  Vision? 0  Difficulty concentrating or making decisions? 0  Walking or climbing stairs? 0  Dressing or bathing? 0  Doing errands, shopping? 0  Preparing Food and eating ? N  Using the Toilet? N  In the past six months, have you accidently leaked urine? N  Do you have problems with loss of bowel control? N  Managing your  Medications? N  Managing your Finances? N  Housekeeping or managing your Housekeeping? N    Patient Care Team: Early, Sung Amabile, NP as PCP - General (Nurse Practitioner)  Indicate any recent Medical Services you may have received from other than [facility] providers in the past year (date may be approximate).     Assessment:   This is a routine wellness examination for Stacy Decker.  Hearing/Vision screen Hearing Screening - Comments:: Denies hearing issues Vision Screening - Comments:: Regular eye exams, [facility]   Goals Addressed             This Visit's Progress    Patient Stated       07/29/2023, wants to start exercise       Depression Screen     07/29/2023    9:34 AM 07/18/2023    9:28 AM 07/26/2022   11:24 AM 07/12/2022    9:14 AM 07/20/2021    2:30 PM 09/21/2020    2:49 PM 02/09/2020   10:21 AM  PHQ 2/9 Scores  PHQ - 2 Score 1 3 0 0 0 0 0  PHQ- 9 Score 1 6 2    0     Fall Risk     07/29/2023    9:32 AM 07/18/2023    9:28 AM 07/26/2022   11:24 AM 07/12/2022    9:14 AM 07/20/2021    2:29 PM  Fall Risk   Falls in the past year? 0 0 0 0 0  Number falls in past yr: 0 0 0 0   Injury with Fall? 0 0 0 0   Risk for fall due to : Medication side effect No Fall Risks Medication side effect No Fall Risks Impaired balance/gait;Impaired mobility;Medication side effect  Follow up Falls prevention discussed;Falls evaluation completed Falls evaluation completed Falls prevention discussed;Education provided;Falls evaluation completed Falls evaluation  completed Falls evaluation completed;Education provided;Falls prevention discussed    MEDICARE RISK AT HOME:  Medicare Risk at Home Any stairs in or around the home?: Yes If so, are there any without handrails?: No Home free of loose throw rugs in walkways, pet beds, electrical cords, etc?: Yes Adequate lighting in your home to reduce risk of falls?: Yes Life alert?: No Use of a cane, walker or w/c?: No Grab bars in the bathroom?: Yes Shower  chair or bench in shower?: Yes Elevated toilet seat or a handicapped toilet?: No  TIMED UP AND GO:  Was the test performed?  No  Cognitive Function: 6CIT completed        07/29/2023    9:36 AM 07/26/2022   11:26 AM 05/29/2017    9:23 AM  6CIT Screen  What Year? 0 points 0 points 0 points  What month? 0 points 0 points 0 points  What time? 0 points 0 points 0 points  Count back from 20 0 points 0 points 0 points  Months in reverse 0 points 0 points 0 points  Repeat phrase 0 points 0 points 0 points  Total Score 0 points 0 points 0 points    Immunizations Immunization History  Administered Date(s) Administered   Fluad Quad(high Dose 65+) 02/09/2020, 02/24/2022   Influenza, High Dose Seasonal PF 01/30/2016, 03/09/2018   Influenza-Unspecified 03/10/2012, 02/12/2013, 03/11/2015, 02/28/2017, 02/15/2021, 01/29/2023   PFIZER(Purple Top)SARS-COV-2 Vaccination 07/10/2019, 08/02/2019, 02/07/2020   Pfizer Covid-19 Vaccine Bivalent Booster 73yrs & up 05/18/2021   Pfizer(Comirnaty)Fall Seasonal Vaccine 12 years and older 01/29/2023   Pneumococcal Conjugate-13 10/11/2014   Pneumococcal Polysaccharide-23 02/19/2016   Tdap 09/10/2013   Zoster Recombinant(Shingrix) 05/18/2021, 09/22/2021   Zoster, Live 03/10/2012    Screening Tests Health Maintenance  Topic Date Due   COVID-19 Vaccine (6 - 2024-25 season) 08/02/2023 (Originally 03/26/2023)   MAMMOGRAM  08/29/2023   DTaP/Tdap/Td (2 - Td or Tdap) 09/11/2023   Medicare Annual Wellness (AWV)  07/28/2024   Colonoscopy  07/30/2026   Pneumonia Vaccine 35+ Years old  Completed   INFLUENZA VACCINE  Completed   DEXA SCAN  Completed   Hepatitis C Screening  Completed   Zoster Vaccines- Shingrix  Completed   HPV VACCINES  Aged Out    Health Maintenance  There are no preventive care reminders to display for this patient. Health Maintenance Items Addressed: Up to date  Additional Screening:  Vision Screening: Recommended annual  ophthalmology exams for early detection of glaucoma and other disorders of the eye.  Dental Screening: Recommended annual dental exams for proper oral hygiene  Community Resource Referral / Chronic Care Management: CRR required this visit?  No   CCM required this visit?  No     Plan:     I have personally reviewed and noted the following in the patient's chart:   Medical and social history Use of alcohol, tobacco or illicit drugs  Current medications and supplements including opioid prescriptions. Patient is not currently taking opioid prescriptions. Functional ability and status Nutritional status Physical activity Advanced directives List of other physicians Hospitalizations, surgeries, and ER visits in previous 12 months Vitals Screenings to include cognitive, depression, and falls Referrals and appointments  In addition, I have reviewed and discussed with patient certain preventive protocols, quality metrics, and best practice recommendations. A written personalized care plan for preventive services as well as general preventive health recommendations were provided to patient.     Barb Merino, LPN   0/01/6044   After Visit Summary: (  MyChart) Due to this being a telephonic visit, the after visit summary with patients personalized plan was offered to patient via MyChart   Notes: Nothing significant to report at this time.

## 2023-07-29 NOTE — Patient Instructions (Signed)
 [name] , Thank you for taking time to come for your Medicare Wellness Visit. I appreciate your ongoing commitment to your health goals. Please review the following plan we discussed and let me know if I can assist you in the future.   Referrals/Orders/Follow-Ups/Clinician Recommendations: no  This is a list of the screening recommended for you and due dates:  Health Maintenance  Topic Date Due   COVID-19 Vaccine (6 - 2024-25 season) 08/02/2023*   Mammogram  08/29/2023   DTaP/Tdap/Td vaccine (2 - Td or Tdap) 09/11/2023   Medicare Annual Wellness Visit  07/28/2024   Colon Cancer Screening  07/30/2026   Pneumonia Vaccine  Completed   Flu Shot  Completed   DEXA scan (bone density measurement)  Completed   Hepatitis C Screening  Completed   Zoster (Shingles) Vaccine  Completed   HPV Vaccine  Aged Out  *Topic was postponed. The date shown is not the original due date.    Advanced directives: (In Chart) A copy of your advanced directives are scanned into your chart should your provider ever need it.  Next Medicare Annual Wellness Visit scheduled for next year: Yes  insert Preventive Care attachment Insert FALL PREVENTION attachment if needed

## 2023-07-30 ENCOUNTER — Other Ambulatory Visit: Payer: Self-pay

## 2023-07-30 DIAGNOSIS — Z1231 Encounter for screening mammogram for malignant neoplasm of breast: Secondary | ICD-10-CM

## 2023-08-13 ENCOUNTER — Ambulatory Visit

## 2023-08-19 ENCOUNTER — Other Ambulatory Visit: Payer: Self-pay | Admitting: Nurse Practitioner

## 2023-08-19 ENCOUNTER — Ambulatory Visit
Admission: RE | Admit: 2023-08-19 | Discharge: 2023-08-19 | Disposition: A | Discharge status: Home / Self Care | Source: Ambulatory Visit | Attending: Nurse Practitioner

## 2023-08-19 DIAGNOSIS — Z1231 Encounter for screening mammogram for malignant neoplasm of breast: Secondary | ICD-10-CM

## 2023-08-19 DIAGNOSIS — L814 Other melanin hyperpigmentation: Secondary | ICD-10-CM

## 2023-08-21 ENCOUNTER — Encounter: Payer: Self-pay | Admitting: Nurse Practitioner

## 2023-09-17 ENCOUNTER — Ambulatory Visit (INDEPENDENT_AMBULATORY_CARE_PROVIDER_SITE_OTHER): Admitting: Family Medicine

## 2023-09-17 ENCOUNTER — Encounter: Payer: Self-pay | Admitting: Family Medicine

## 2023-09-17 VITALS — BP 124/80 | HR 100 | Wt 154.2 lb

## 2023-09-17 DIAGNOSIS — E778 Other disorders of glycoprotein metabolism: Secondary | ICD-10-CM | POA: Diagnosis not present

## 2023-09-17 DIAGNOSIS — L230 Allergic contact dermatitis due to metals: Secondary | ICD-10-CM

## 2023-09-17 NOTE — Progress Notes (Signed)
   Subjective:    Patient ID: Stacy Decker, female    DOB: 1951/03/26, 73 y.o.   MRN: [id number]  HPI She states that she was told that her protein level was low.  She gives plasma twice per week to make extra money and on her last visit she was told that it was too low.  This was done on April 8.  She did give plasma 1 more time before the results actually came in indicating that she was low. She also has a rash present on her right forearm but no other lesions anywhere else on her body.   Review of Systems     Objective:    Physical Exam Exam of her skin does show erythema mainly on the lateral aspect of her forearm, not on her hands or medial aspect or anywhere else on her body.       Assessment & Plan:  Hypoproteinemia (HCC) - Plan: Comprehensive metabolic panel with GFR  Contact dermatitis due to metals, unspecified contact dermatitis type I explained that her low protein levels are probably because she is giving too often and not allowing her body to rebuild supplies.  Recommend good nutrition and we can recheck this in 1 month.  The order was placed. I then discussed the contact dermatitis and explained that this is a contact irritant possibly from her cat and the therapy would be cool compresses and cortisone cream OTC to help with the itching.  She was comfortable with that.

## 2023-10-16 ENCOUNTER — Other Ambulatory Visit

## 2023-10-17 ENCOUNTER — Other Ambulatory Visit

## 2023-10-17 DIAGNOSIS — E778 Other disorders of glycoprotein metabolism: Secondary | ICD-10-CM | POA: Diagnosis not present

## 2023-10-18 LAB — COMPREHENSIVE METABOLIC PANEL WITH GFR
ALT: 13 IU/L (ref 0–32)
AST: 24 IU/L (ref 0–40)
Albumin: 4 g/dL (ref 3.8–4.8)
Alkaline Phosphatase: 103 IU/L (ref 44–121)
BUN/Creatinine Ratio: 20 (ref 12–28)
BUN: 14 mg/dL (ref 8–27)
Bilirubin Total: 0.4 mg/dL (ref 0.0–1.2)
CO2: 20 mmol/L (ref 20–29)
Calcium: 9.5 mg/dL (ref 8.7–10.3)
Chloride: 105 mmol/L (ref 96–106)
Creatinine, Ser: 0.69 mg/dL (ref 0.57–1.00)
Globulin, Total: 1.9 g/dL (ref 1.5–4.5)
Glucose: 108 mg/dL — ABNORMAL HIGH (ref 70–99)
Potassium: 5 mmol/L (ref 3.5–5.2)
Sodium: 140 mmol/L (ref 134–144)
Total Protein: 5.9 g/dL — ABNORMAL LOW (ref 6.0–8.5)
eGFR: 91 mL/min/{1.73_m2} (ref >59)

## 2023-10-19 ENCOUNTER — Ambulatory Visit: Payer: Self-pay | Admitting: Family Medicine

## 2023-12-17 ENCOUNTER — Other Ambulatory Visit: Payer: Self-pay | Admitting: Nurse Practitioner

## 2023-12-17 DIAGNOSIS — F32A Depression, unspecified: Secondary | ICD-10-CM

## 2023-12-18 ENCOUNTER — Telehealth: Payer: Self-pay

## 2023-12-18 ENCOUNTER — Other Ambulatory Visit: Payer: Self-pay

## 2023-12-18 DIAGNOSIS — F419 Anxiety disorder, unspecified: Secondary | ICD-10-CM

## 2023-12-18 MED ORDER — CITALOPRAM HYDROBROMIDE 20 MG PO TABS: 30.0000 mg | ORAL_TABLET | Freq: Every evening | ORAL | 3 refills | Status: AC

## 2023-12-18 NOTE — Telephone Encounter (Signed)
 Copied from CRM [id number]. Topic: Clinical - Medication Question >> Dec 18, 2023 12:56 PM Santiya F wrote: Patient is calling in because she needs her citalopram  (CELEXA ) 20 MG tablet [[id number]] refilled and the pharmacy requested it but it says refill not appropriate. Patient wants to know why it can't be filled.

## 2024-01-06 ENCOUNTER — Ambulatory Visit: Admitting: Family Medicine

## 2024-01-06 ENCOUNTER — Encounter: Payer: Self-pay | Admitting: Family Medicine

## 2024-01-06 ENCOUNTER — Other Ambulatory Visit (HOSPITAL_COMMUNITY)
Admission: RE | Admit: 2024-01-06 | Discharge: 2024-01-06 | Disposition: A | Discharge status: Home / Self Care | Source: Ambulatory Visit | Attending: Family Medicine | Admitting: Family Medicine

## 2024-01-06 VITALS — BP 116/70 | HR 76 | Ht 60.0 in | Wt 156.8 lb

## 2024-01-06 DIAGNOSIS — L989 Disorder of the skin and subcutaneous tissue, unspecified: Secondary | ICD-10-CM | POA: Insufficient documentation

## 2024-01-06 DIAGNOSIS — C44722 Squamous cell carcinoma of skin of right lower limb, including hip: Secondary | ICD-10-CM | POA: Diagnosis not present

## 2024-01-06 NOTE — Progress Notes (Signed)
   Subjective:    Patient ID: Stacy Decker, female    DOB: January 17, 1951, 73 y.o.   MRN: [id number]  HPI She is here for evaluation of a lesion present on the right medial lower calf area.  It has been there for several months.   Review of Systems     Objective:    Physical Exam A 1-1/2 cm raised slightly irritated lesion is noted in the above area.       Assessment & Plan:  Skin lesion of right leg - Plan: Surgical pathology This appears to be a wart.  It was injected with Xylocaine  and epinephrine.  A shave excision was accomplished without difficulty.  The base was hyfrecated.  Follow-up pending results of pathology report.

## 2024-01-13 ENCOUNTER — Ambulatory Visit: Admitting: Podiatry

## 2024-01-13 ENCOUNTER — Ambulatory Visit: Payer: Self-pay

## 2024-01-13 ENCOUNTER — Ambulatory Visit (INDEPENDENT_AMBULATORY_CARE_PROVIDER_SITE_OTHER)

## 2024-01-13 VITALS — Ht 60.0 in | Wt 156.8 lb

## 2024-01-13 DIAGNOSIS — M2012 Hallux valgus (acquired), left foot: Secondary | ICD-10-CM | POA: Diagnosis not present

## 2024-01-13 DIAGNOSIS — M21612 Bunion of left foot: Secondary | ICD-10-CM | POA: Diagnosis not present

## 2024-01-13 NOTE — Telephone Encounter (Signed)
 Copied from CRM [id number]. Topic: Clinical - Lab/Test Results >> Jan 13, 2024 12:22 PM Graeme ORN wrote: Reason for CRM: Patient called to check on lab results. Let patient know results have not yet been received. Patient would like a call back with results once reviewed. Thank You  Left message to let patient know that pathology is not yet back, someone will call as soon as results are available.

## 2024-01-13 NOTE — Telephone Encounter (Signed)
 First attempt; no answer   Copied from CRM [id number]. Topic: Clinical - Medical Advice >> Jan 13, 2024 12:21 PM Graeme ORN wrote: Reason for CRM: Patient called. Stated she was seen last week and had possible wart removed. States it has not healed yet. She is keeping it covered with bandaid. Would like to know if it is normal or should it have been healed by now. Thank You

## 2024-01-13 NOTE — Telephone Encounter (Signed)
 1st attempt---Left a voicemail for a call back

## 2024-01-13 NOTE — Telephone Encounter (Signed)
 FYI Only or Action Required?: FYI only for provider.  Patient was last seen in primary care on 01/06/2024 by Joyce Norleen BROCKS, MD.  Called Nurse Triage reporting Open Wound.  Symptoms began a week ago.  Interventions attempted: Rest, hydration, or home remedies.  Symptoms are: gradually improving.  Triage Disposition: Home Care  Patient/caregiver understands and will follow disposition?: Yes   Patient is concerned about her open wound that's not closing up says she had a wart removed.   Reason for Disposition  Saline dressing changes, questions about  Answer Assessment - Initial Assessment Questions Patient has had wound covered with bandaid since procedure date and has been applying hydrogen peroxide.   Confirmed that it is safe to leave open to air when she is at home. Will continue to cover with bandage while at work.   Will stop hydrogen peroxide and clean with soap and water    1. SYMPTOM: What's the main symptom you're concerned about? (e.g., drainage, incision opened up, pain, redness)    Residual drainage, not drying up  3. SURGERY: What surgery did you have?     Procedure to remove wart on 01/06/2024  5. INCISION SITE: Where is the incision located?      foot 6. REDNESS: Is there any redness at the incision site? If Yes, ask: How wide across is the redness? (Inches, centimeters)      denies 7. PAIN: Is there any pain? If Yes, ask: How bad is it?  (Scale 0-10; or none, mild, moderate, severe)     No pain, but sensitive to touch 8. BLEEDING: Is there any bleeding? If Yes, ask: How much? and Where?     Reddish/brown fluid 9. DRAINAGE: Is there any drainage from the incision site? If Yes, ask: What color and how much? (e.g., red, cloudy, pus; drops, teaspoon)     denies 10. FEVER: Do you have a fever? If Yes, ask: What is your temperature, how was it measured, and when did it start?       denies 11. OTHER SYMPTOMS: Do you have any other  symptoms? (e.g., dizziness, rash elsewhere on body, shaking chills, weakness)       N/a 12. PREGNANCY: Is there any chance you are pregnant? When was your last menstrual  period?       N/a  Protocols used: Post-Op Incision Symptoms and Questions-A-AH

## 2024-01-14 LAB — SURGICAL PATHOLOGY

## 2024-01-14 NOTE — Progress Notes (Signed)
  Subjective:  Patient ID: Stacy Decker, female    DOB: 15-Jun-1950,  MRN: [id number]  Chief Complaint  Patient presents with   Foot Pain    RM 4 Follow up on left foot surgery about 2 years ago. Dr. Silva had commented on my last visit that there may be a screw that need removing. It's a little uncomfortable, so I'd like him to check. Patient states some intermittent stiffness in left foot.     73 y.o. female returns for follow-up on left foot surgery with midfoot fusion and double arthrodesis 2 years ago for pes planus deformity.  She is doing quite well.  He is working has occasional discomfort in the midfoot  Review of Systems: Negative except as noted in the HPI. Denies N/V/F/Ch.   Objective:  There were no vitals filed for this visit. Body mass index is 30.62 kg/m. Constitutional Well developed. Well nourished.  Vascular Foot warm and well perfused. Capillary refill normal to all digits.  Calf is soft and supple, no posterior calf or knee pain, negative Homans' sign.  No swelling in the calf  Neurologic Normal speech. Oriented to person, place, and time. Epicritic sensation to light touch grossly present bilaterally.  Dermatologic Incisions well healed and non hypertrophic  Orthopedic: No edema in foot, still slight valgus positioning on weightbearing, she has no pain to palpation of the subtalar joint talonavicular joint or midfoot to palpation   Multiple view plain film radiographs: new films today show maintained alignment and correction, the previous interval lucencies have consolidated with complete fusion across the fusion sites.   CT 12/20/21 IMPRESSION: Postsurgical changes of subtalar, talonavicular, and first TMT arthrodesis. Hardware is intact.   Progressive bony fusion across the posterior subtalar joint along the medial aspect.   3 mm bony bridging across the dorsal aspect of the talonavicular joint, the majority of the joint is unfused this time.    Bony fusion along the central aspect of the first TMT joint.   Mild lucency along the proximal distal tips of a retrograde talonavicular screw.   Moderate first MTP and mild-to-moderate diffuse interphalangeal joint osteoarthritis.     Electronically Signed   By: Jacob  Kahn M.D.   On: 12/20/2021 13:34  Assessment:   1. Hallux valgus with bunions, left     Plan:  Patient was evaluated and treated and all questions answered.  S/p foot surgery left Overall doing fairly well.  She is experiencing occasional pain in the midfoot which seem to be localized to the medial [place] joint.  We again discussed the [place] screw may have some prominence here and could be removed if this becomes an issue for her.  Currently not severe enough that she would like to proceed with this she will return to see me as needed if it does. Return if symptoms worsen or fail to improve.

## 2024-01-15 ENCOUNTER — Ambulatory Visit: Payer: Self-pay | Admitting: Family Medicine

## 2024-01-15 DIAGNOSIS — C4492 Squamous cell carcinoma of skin, unspecified: Secondary | ICD-10-CM | POA: Insufficient documentation

## 2024-01-15 NOTE — Progress Notes (Signed)
 Documented

## 2024-02-12 ENCOUNTER — Other Ambulatory Visit: Payer: Self-pay | Admitting: Nurse Practitioner

## 2024-02-12 DIAGNOSIS — Z0000 Encounter for general adult medical examination without abnormal findings: Secondary | ICD-10-CM

## 2024-02-12 DIAGNOSIS — F32A Depression, unspecified: Secondary | ICD-10-CM

## 2024-06-03 ENCOUNTER — Ambulatory Visit (INDEPENDENT_AMBULATORY_CARE_PROVIDER_SITE_OTHER): Admitting: Family Medicine

## 2024-06-03 ENCOUNTER — Ambulatory Visit: Payer: Self-pay

## 2024-06-03 VITALS — BP 130/70 | HR 76 | Temp 98.2°F | Ht 60.0 in | Wt 155.8 lb

## 2024-06-03 DIAGNOSIS — J020 Streptococcal pharyngitis: Secondary | ICD-10-CM

## 2024-06-03 DIAGNOSIS — J029 Acute pharyngitis, unspecified: Secondary | ICD-10-CM | POA: Diagnosis not present

## 2024-06-03 LAB — POCT RAPID STREP A (OFFICE): Rapid Strep A Screen: POSITIVE — AB

## 2024-06-03 MED ORDER — AMOXICILLIN 500 MG PO CAPS: 500.0000 mg | ORAL_CAPSULE | Freq: Two times a day (BID) | ORAL | 0 refills | Status: AC

## 2024-06-03 NOTE — Telephone Encounter (Signed)
 FYI Only or Action Required?: Action required by provider: request for appointment.  Patient was last seen in primary care on 01/06/2024 by Joyce Norleen BROCKS, MD.  Called Nurse Triage reporting Sore Throat.  Symptoms began several weeks ago.  Interventions attempted: Rest, hydration, or home remedies.  Symptoms are: gradually worsening. Sore throat since 05/23/24. Now has cough, runny nose, low grade fever.  Triage Disposition: See PCP When Office is Open (Within 3 Days)  Patient/caregiver understands and will follow disposition?: Yes       Copied from CRM #[id number]. Topic: Clinical - Red Word Triage >> Jun 03, 2024 11:55 AM Gustabo D wrote: Pt has sore throat and cough, and flem since Dec 28,2025. Reason for Disposition  [1] Sore throat with cough/cold symptoms AND [2] present > 5 days  Answer Assessment - Initial Assessment Questions 1. ONSET: When did the throat start hurting? (Hours or days ago)      05/23/24 2. SEVERITY: How bad is the sore throat? (Scale 1-10; mild, moderate or severe)     moderate 3. STREP EXPOSURE: Has there been any exposure to strep within the past week? If Yes, ask: What type of contact occurred?      no 4.  VIRAL SYMPTOMS: Are there any symptoms of a cold, such as a runny nose, cough, hoarse voice or red eyes?      Cough, runny nose 5. FEVER: Do you have a fever? If Yes, ask: What is your temperature, how was it measured, and when did it start?     Low grade 6. PUS ON THE TONSILS: Is there pus on the tonsils in the back of your throat?     no 7. OTHER SYMPTOMS: Do you have any other symptoms? (e.g., difficulty breathing, headache, rash)     no 8. PREGNANCY: Is there any chance you are pregnant? When was your last menstrual period?     no  Protocols used: Sore Throat-A-AH

## 2024-06-03 NOTE — Progress Notes (Signed)
 Chief Complaint  Patient presents with   Sore Throat    Started about two weeks ago with ST and cough. Low grade fever. No chills or body aches. Has been taking chloraseptic lozenges. Wants to make sure she doesn't have strep.     Sore throat since 05/23/24. Now has cough, runny nose, low grade fever.  Temp was 99.8 two nights ago, no fever since. The cough is infrequent.  Doesn't get up much phlegm (some after a shower, was discolored a few days ago, no discoloration since then. Doesn't feel like there is postnasal drainage. Runny nose is mild and clear, just infrequent. She feels more tired than usual. Main concern is that of a scratchy throat.  Alka seltzer cold and cough helped her sleep, hasn't taken it in 2 days.  No known sick contacts. Had been around family and young children over the holidays, not aware of illnesses.    PMH, PSH, SH reviewed  Outpatient Encounter Medications as of 06/03/2024  Medication Sig   aspirin  EC 81 MG tablet Take 81 mg by mouth daily. Swallow whole.   buPROPion  (WELLBUTRIN  XL) 150 MG 24 hr tablet TAKE 1 TABLET BY MOUTH EVERY DAY   Calcium  Carb-Cholecalciferol  (CALCIUM  1000 + D) 1000-20 MG-MCG TABS    citalopram  (CELEXA ) 20 MG tablet Take 1.5 tablets (30 mg total) by mouth at bedtime.   glucosamine-chondroitin 500-400 MG tablet Take 1 tablet by mouth daily.   Multiple Vitamin (MULTIVITAMIN) tablet Take 1 tablet by mouth daily.   No facility-administered encounter medications on file as of 06/03/2024.   No Known Allergies  ROS:  LG fever and URI symptoms per HPI. No n/v/d, bleeding, bruising, rash. See HPI    PHYSICAL EXAM:  BP 130/70   Pulse 76   Temp 98.2 F (36.8 C) (Tympanic)   Ht 5' (1.524 m)   Wt 155 lb 12.8 oz (70.7 kg)   BMI 30.43 kg/m   Occasional throat-clearing and rare cough during visit HEENT: conjunctiva and sclera are clear, EOMI. TM's and EAC's normal. OP appears normal--no erythema, cobblestoning, exudates. Nasal  mucosa with mild edema, L>R, no erythema. Sinuses are nontender Neck: no lymphadenopathy or mass Heart: regular rate and rhythm Lungs: clear Extremities: no edema.  Pt showed me a hyperpigmented area of skin which is smooth and flat--area of biopsy right medial ankle, done by Dr. Laonde. Neuro: alert and oriented, cranial nerves grossly intact, normal gait. Psych: normal mood, affect, hygiene and grooming.  Rapid strep test +  ASSESSMENT/PLAN:  Strep pharyngitis - treat with amox; advised to change out toothbrush after 24 hours of ABX and that she is contagious until then as well. - Plan: amoxicillin  (AMOXIL ) 500 MG capsule  Sore throat - tested + for strep, but suspect she also has congestion and PND as contributing factors. Supportive measures reviewed - Plan: Rapid Strep A

## 2024-06-03 NOTE — Patient Instructions (Addendum)
 Take the antibiotic to treat the strep throat. I'm quite surprised, as your throat exam looked pretty normal. You likely also have some postnasal drainage, contributing to the slight congestion, occasional cough, and some of the scratchy throat. Consider using an allergy medication like claritin or allegra to help with runny nose. If your cough worsens--where you have more frequent cough, or thick mucus or phlegm, then start taking mucinex or Robitussin.   Stay well hydrated.

## 2024-07-19 ENCOUNTER — Ambulatory Visit: Payer: PPO | Admitting: Nurse Practitioner

## 2024-08-03 ENCOUNTER — Ambulatory Visit: Payer: Self-pay

## 2024-08-05 ENCOUNTER — Ambulatory Visit: Admitting: Nurse Practitioner
